# Patient Record
Sex: Female | Born: 1935 | Race: White | Hispanic: No | Smoking: Never smoker
Health system: Southern US, Community
[De-identification: ages and names within clinical notes are randomized; demographics above are authoritative.]

## PROBLEM LIST (undated history)

## (undated) DIAGNOSIS — R2681 Unsteadiness on feet: Secondary | ICD-10-CM

## (undated) DIAGNOSIS — I10 Essential (primary) hypertension: Secondary | ICD-10-CM

## (undated) DIAGNOSIS — I83813 Varicose veins of bilateral lower extremities with pain: Secondary | ICD-10-CM

## (undated) DIAGNOSIS — F419 Anxiety disorder, unspecified: Secondary | ICD-10-CM

## (undated) DIAGNOSIS — N938 Other specified abnormal uterine and vaginal bleeding: Secondary | ICD-10-CM

## (undated) DIAGNOSIS — R4020 Unspecified coma: Secondary | ICD-10-CM

## (undated) DIAGNOSIS — R002 Palpitations: Secondary | ICD-10-CM

## (undated) DIAGNOSIS — N950 Postmenopausal bleeding: Secondary | ICD-10-CM

## (undated) DIAGNOSIS — W19XXXA Unspecified fall, initial encounter: Secondary | ICD-10-CM

## (undated) DIAGNOSIS — R079 Chest pain, unspecified: Secondary | ICD-10-CM

## (undated) DIAGNOSIS — H269 Unspecified cataract: Secondary | ICD-10-CM

## (undated) DIAGNOSIS — K219 Gastro-esophageal reflux disease without esophagitis: Secondary | ICD-10-CM

## (undated) DIAGNOSIS — H9190 Unspecified hearing loss, unspecified ear: Secondary | ICD-10-CM

## (undated) DIAGNOSIS — M1612 Unilateral primary osteoarthritis, left hip: Secondary | ICD-10-CM

## (undated) DIAGNOSIS — M810 Age-related osteoporosis without current pathological fracture: Secondary | ICD-10-CM

## (undated) DIAGNOSIS — I499 Cardiac arrhythmia, unspecified: Secondary | ICD-10-CM

## (undated) DIAGNOSIS — K5790 Diverticulosis of intestine, part unspecified, without perforation or abscess without bleeding: Secondary | ICD-10-CM

## (undated) DIAGNOSIS — T4145XA Adverse effect of unspecified anesthetic, initial encounter: Secondary | ICD-10-CM

## (undated) DIAGNOSIS — R413 Other amnesia: Secondary | ICD-10-CM

## (undated) HISTORY — DX: Unilateral primary osteoarthritis, left hip: M16.12

## (undated) HISTORY — DX: Other specified abnormal uterine and vaginal bleeding: N93.8

## (undated) HISTORY — DX: Unsteadiness on feet: R26.81

## (undated) HISTORY — DX: Age-related osteoporosis without current pathological fracture: M81.0

## (undated) HISTORY — PX: FRACTURE SURGERY: SHX138

## (undated) HISTORY — DX: Unspecified coma: R40.20

## (undated) HISTORY — DX: Diverticulosis of intestine, part unspecified, without perforation or abscess without bleeding: K57.90

## (undated) HISTORY — PX: CATARACT EXTRACTION: SUR2

## (undated) HISTORY — DX: Palpitations: R00.2

## (undated) HISTORY — DX: Gastro-esophageal reflux disease without esophagitis: K21.9

## (undated) HISTORY — DX: Unspecified cataract: H26.9

## (undated) HISTORY — DX: Other amnesia: R41.3

## (undated) HISTORY — DX: Chest pain, unspecified: R07.9

## (undated) HISTORY — PX: DIAGNOSTIC LAPAROSCOPY: SUR761

## (undated) HISTORY — PX: HIP SURGERY: SHX245

## (undated) HISTORY — PX: TONSILLECTOMY: SUR1361

## (undated) HISTORY — PX: APPENDECTOMY: SHX54

## (undated) HISTORY — DX: Unspecified hearing loss, unspecified ear: H91.90

## (undated) HISTORY — DX: Anxiety disorder, unspecified: F41.9

## (undated) HISTORY — DX: Unspecified fall, initial encounter: W19.XXXA

## (undated) HISTORY — DX: Varicose veins of bilateral lower extremities with pain: I83.813

## (undated) HISTORY — PX: TONSILLECTOMY AND ADENOIDECTOMY: SUR1326

---
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
## 1964-09-14 HISTORY — PX: ECTOPIC PREGNANCY SURGERY: SHX613

## 1998-07-25 ENCOUNTER — Other Ambulatory Visit: Admission: RE | Admit: 1998-07-25 | Discharge: 1998-07-25 | Payer: Self-pay | Admitting: Internal Medicine

## 1998-08-24 ENCOUNTER — Emergency Department (HOSPITAL_COMMUNITY): Admission: EM | Admit: 1998-08-24 | Discharge: 1998-08-24 | Payer: Self-pay | Admitting: Emergency Medicine

## 1999-09-11 ENCOUNTER — Encounter: Payer: Self-pay | Admitting: Emergency Medicine

## 1999-09-11 ENCOUNTER — Inpatient Hospital Stay (HOSPITAL_COMMUNITY): Admission: EM | Admit: 1999-09-11 | Discharge: 1999-09-13 | Payer: Self-pay

## 1999-09-13 ENCOUNTER — Encounter: Payer: Self-pay | Admitting: Cardiology

## 2000-11-03 ENCOUNTER — Encounter (HOSPITAL_BASED_OUTPATIENT_CLINIC_OR_DEPARTMENT_OTHER): Payer: Self-pay | Admitting: Internal Medicine

## 2000-11-03 ENCOUNTER — Ambulatory Visit (HOSPITAL_COMMUNITY): Admission: RE | Admit: 2000-11-03 | Discharge: 2000-11-03 | Payer: Self-pay | Admitting: Internal Medicine

## 2001-01-19 ENCOUNTER — Ambulatory Visit (HOSPITAL_COMMUNITY): Admission: RE | Admit: 2001-01-19 | Discharge: 2001-01-19 | Payer: Self-pay | Admitting: Gastroenterology

## 2003-08-16 ENCOUNTER — Encounter: Admission: RE | Admit: 2003-08-16 | Discharge: 2003-08-16 | Payer: Self-pay | Admitting: Orthopedic Surgery

## 2009-03-12 ENCOUNTER — Encounter: Admission: RE | Admit: 2009-03-12 | Discharge: 2009-03-12 | Payer: Self-pay | Admitting: Orthopedic Surgery

## 2010-02-15 ENCOUNTER — Observation Stay (HOSPITAL_COMMUNITY): Admission: EM | Admit: 2010-02-15 | Discharge: 2010-02-16 | Payer: Self-pay | Admitting: Emergency Medicine

## 2010-02-16 DIAGNOSIS — R079 Chest pain, unspecified: Secondary | ICD-10-CM

## 2010-02-16 HISTORY — DX: Chest pain, unspecified: R07.9

## 2010-12-01 LAB — URINALYSIS, ROUTINE W REFLEX MICROSCOPIC
Bilirubin Urine: NEGATIVE
Glucose, UA: NEGATIVE mg/dL
Hgb urine dipstick: NEGATIVE
Ketones, ur: NEGATIVE mg/dL
Nitrite: NEGATIVE
Protein, ur: NEGATIVE mg/dL
Specific Gravity, Urine: 1.019 (ref 1.005–1.030)
Urobilinogen, UA: 0.2 mg/dL (ref 0.0–1.0)
pH: 6.5 (ref 5.0–8.0)

## 2010-12-01 LAB — LIPID PANEL
Cholesterol: 184 mg/dL (ref 0–200)
HDL: 66 mg/dL (ref >39)
LDL Cholesterol: 109 mg/dL — ABNORMAL HIGH (ref 0–99)
Total CHOL/HDL Ratio: 2.8 RATIO
Triglycerides: 45 mg/dL (ref <150)
VLDL: 9 mg/dL (ref 0–40)

## 2010-12-01 LAB — CARDIAC PANEL(CRET KIN+CKTOT+MB+TROPI)
CK, MB: 2.2 ng/mL (ref 0.3–4.0)
CK, MB: 2.3 ng/mL (ref 0.3–4.0)
Relative Index: INVALID (ref 0.0–2.5)
Relative Index: INVALID (ref 0.0–2.5)
Total CK: 75 U/L (ref 7–177)
Total CK: 79 U/L (ref 7–177)
Troponin I: 0.01 ng/mL (ref 0.00–0.06)
Troponin I: 0.01 ng/mL (ref 0.00–0.06)

## 2010-12-01 LAB — COMPREHENSIVE METABOLIC PANEL
ALT: 18 U/L (ref 0–35)
AST: 21 U/L (ref 0–37)
Albumin: 3.7 g/dL (ref 3.5–5.2)
Alkaline Phosphatase: 66 U/L (ref 39–117)
BUN: 16 mg/dL (ref 6–23)
CO2: 25 mEq/L (ref 19–32)
Calcium: 9.3 mg/dL (ref 8.4–10.5)
Chloride: 104 mEq/L (ref 96–112)
Creatinine, Ser: 0.92 mg/dL (ref 0.4–1.2)
GFR calc Af Amer: >60 mL/min (ref >60)
GFR calc non Af Amer: 59 mL/min — ABNORMAL LOW (ref >60)
Glucose, Bld: 102 mg/dL — ABNORMAL HIGH (ref 70–99)
Potassium: 4.6 mEq/L (ref 3.5–5.1)
Sodium: 135 mEq/L (ref 135–145)
Total Bilirubin: 0.5 mg/dL (ref 0.3–1.2)
Total Protein: 6.3 g/dL (ref 6.0–8.3)

## 2010-12-01 LAB — URINE MICROSCOPIC-ADD ON

## 2010-12-01 LAB — PROTIME-INR
INR: 0.95 (ref 0.00–1.49)
Prothrombin Time: 12.6 seconds (ref 11.6–15.2)

## 2010-12-01 LAB — POCT CARDIAC MARKERS
CKMB, poc: 1.4 ng/mL (ref 1.0–8.0)
Myoglobin, poc: 78.1 ng/mL (ref 12–200)
Troponin i, poc: <0.05 ng/mL (ref 0.00–0.09)

## 2010-12-01 LAB — CBC
HCT: 43.1 % (ref 36.0–46.0)
Hemoglobin: 14.6 g/dL (ref 12.0–15.0)
MCHC: 34 g/dL (ref 30.0–36.0)
MCV: 90.9 fL (ref 78.0–100.0)
Platelets: 262 10*3/uL (ref 150–400)
RBC: 4.74 MIL/uL (ref 3.87–5.11)
RDW: 14.8 % (ref 11.5–15.5)
WBC: 6.2 10*3/uL (ref 4.0–10.5)

## 2010-12-01 LAB — DIFFERENTIAL
Basophils Absolute: 0 10*3/uL (ref 0.0–0.1)
Basophils Relative: 1 % (ref 0–1)
Eosinophils Absolute: 0.4 10*3/uL (ref 0.0–0.7)
Eosinophils Relative: 6 % — ABNORMAL HIGH (ref 0–5)
Lymphocytes Relative: 31 % (ref 12–46)
Lymphs Abs: 1.9 10*3/uL (ref 0.7–4.0)
Monocytes Absolute: 0.5 10*3/uL (ref 0.1–1.0)
Monocytes Relative: 8 % (ref 3–12)
Neutro Abs: 3.4 10*3/uL (ref 1.7–7.7)
Neutrophils Relative %: 55 % (ref 43–77)

## 2010-12-01 LAB — D-DIMER, QUANTITATIVE: D-Dimer, Quant: 0.37 ug/mL-FEU (ref 0.00–0.48)

## 2010-12-01 LAB — BASIC METABOLIC PANEL
BUN: 20 mg/dL (ref 6–23)
CO2: 20 mEq/L (ref 19–32)
Calcium: 9.9 mg/dL (ref 8.4–10.5)
Chloride: 104 mEq/L (ref 96–112)
Creatinine, Ser: 0.98 mg/dL (ref 0.4–1.2)
GFR calc Af Amer: >60 mL/min (ref >60)
GFR calc non Af Amer: 55 mL/min — ABNORMAL LOW (ref >60)
Glucose, Bld: 105 mg/dL — ABNORMAL HIGH (ref 70–99)
Potassium: 3.8 mEq/L (ref 3.5–5.1)
Sodium: 133 mEq/L — ABNORMAL LOW (ref 135–145)

## 2010-12-01 LAB — HEMOCCULT GUIAC POC 1CARD (OFFICE): Fecal Occult Bld: NEGATIVE

## 2010-12-01 LAB — TSH: TSH: 3.144 u[IU]/mL (ref 0.350–4.500)

## 2010-12-01 LAB — TROPONIN I: Troponin I: 0.02 ng/mL (ref 0.00–0.06)

## 2010-12-01 LAB — CK TOTAL AND CKMB (NOT AT ARMC)
CK, MB: 2.5 ng/mL (ref 0.3–4.0)
Relative Index: INVALID (ref 0.0–2.5)
Total CK: 83 U/L (ref 7–177)

## 2010-12-01 LAB — APTT: aPTT: 27 seconds (ref 24–37)

## 2011-01-30 NOTE — Discharge Summary (Signed)
[place]. [facility]  Patient:    Melissa Valenzuela                    MRN: [id number] Adm. Date:  [id number] Disc. Date: [id number] Attending:  Fritzi Mandes CC:         Clovis Pu Patty Sermons, M.D.                           Discharge Summary  HISTORY OF PRESENT ILLNESS:  [age]-year-old white female who awoke with substernal chest pain radiating to the left arm.  This discomfort lasted about eight hours, and was not associated with shortness of breath, diaphoresis, or nausea.  She was quite anxious at the time.  She presented to the [facility] Emergency Room for evaluation.  PAST MEDICAL HISTORY: 1. Vertebral compression fracture with chronic low back pain. 2. In 1997, workup for microscopic hematuria, which was normal. 3. Diverticulosis. 4. Cataracts.  MEDICATIONS: 1. Premarin cream p.r.n. 2. Fosamax 10 mg p.o. q.d. 3. Aspirin 325 mg p.o. q.d. p.r.n. 5. Multivitamins. 6. Calcium supplements.  PAST SURGICAL HISTORY: 1. In 1988, right hip ORIF. 2. In 1960s, ectopic pregnancy leading to left salpingo-oophorectomy.  PHYSICAL EXAMINATION:  GENERAL:  Well-appearing white female, in no apparent distress.  VITAL SIGNS:  Initial blood pressure 178/113 with pulse of 72, respiratory rate 20, and 100% oxygen saturation on room air.  HEENT:  Within normal limits.  CHEST:  Clear to auscultation.  HEENT:  Regular rate and rhythm.  S1, S2, without murmur, gallop, or rub.  ABDOMEN:  Normal bowel sounds with no evidence of hepatosplenomegaly or tenderness.  EXTREMITIES:  Without cyanosis, clubbing, or edema.  NEUROLOGIC:  Nonfocal.  LABORATORY DATA:  Initial CPK level was 116 with MB 2.2, troponin I level was 0.06.  EKG showed: 1. Normal sinus rhythm. 2. Within normal limits.  HOSPITAL COURSE:  The patient was admitted for a rule out myocardial infarction  protocol.  Serial CPK and troponin I levels were within normal limits,  showing o evidence of myocardial infarction.  She had no further episodes of chest discomfort while an inpatient.  A Cardiolite exercise test was done on September 13, 1999, n which she exercised six minutes on a Bruce protocol, achieving a maximum heart ate of 150, surpassing her target heart rate.  She had no chest discomfort with exercise, or EKG changes suggestive of ischemia.  The Cardiolite images showed normal left ventricular wall motion with somewhat prominent activity in the right ventricle suggestive of right ventricular enlargement and chronic lung disease.  There was no evidence of ischemia or infarction.  The estimated left ventricular ejection fraction was 65%.  CONDITION ON DISCHARGE:  The patient felt fine, with no further episodes of chest discomfort or shortness of breath.  PROCEDURES:  Cardiolite exercise stress test.  COMPLICATIONS:  None.  DISCHARGE DIAGNOSES: 1. Chest pain with no evidence of myocardial infarction or coronary artery disease. 2. Osteoporosis. 3. Chronic low back pain. 4. History of microscopic hematuria.  DISCHARGE MEDICATIONS: 1. Atenolol 25 mg p.o. q.a.m. 2. Fosamax 10 mg p.o. q.d. 3. Enteric-coated aspirin 325 mg p.o. q.d. p.r.n. 4. Multivitamins 1 tablet p.o. q.d. 5. Calcium supplements daily. 6. Premarin cream p.r.n.  FOLLOW-UP:  The patient was advised to call Dr. Jarome Matin for a follow-up at [facility] within two weeks of discharge. DD:  10/21/99 TD:  10/21/99 Job: [id number]

## 2011-01-30 NOTE — Procedures (Signed)
[facility]  Patient:    Melissa Valenzuela, NEEDS                   MRN: [id number] Proc. Date: 01/19/01 Adm. Date:  [id number] Attending:  Orland Mustard CC:         Barry Dienes. Eloise Harman, M.D.   Procedure Report  PROCEDURE:  Colonoscopy.  SCOPE:  Olympus pediatric video colonoscope.  MEDICATIONS:  Fentanyl 125 mcg, Versed 8.5 mg IV.  INDICATIONS:  A strong family history of colon cancer.  Her mother had colon cancer, great-aunt had colon cancer and also polyps in the family.  DESCRIPTION OF PROCEDURE:  The procedure had been explained to the patient and consent obtained.  With the patient in the left lateral decubitus position, the Olympus pediatric video colonoscope was inserted and advanced under direct visualization.  The prep was excellent.  The patient had fairly extensive diverticular disease.  It took some time to pass.  After we passed, we were able to advance fairly easily to the cecum.  The ileocecal valve was identified.  The scope was withdrawn.  The cecum, ascending colon, hepatic flexure, transverse colon, splenic flexure, descending, and sigmoid colon were seen well, and no polyps were seen.  Extensive diverticula disease of the sigmoid colon.  The rectum was free of polyps.  The scope was withdrawn.  The patient tolerated the procedure well and was maintained on low-flow oxygen and pulse oximeter throughout the procedure.  ASSESSMENT: 1. No evidence of colon polyps or any other neoplasia. 2. Diverticulosis.  PLAN:  Yearly Hemoccults.  We will give a handout on diverticular disease and recommend repeating in five years due to her family history of colon cancer. DD:  01/19/01 TD:  01/19/01 Job: [id number]

## 2011-04-23 ENCOUNTER — Other Ambulatory Visit (HOSPITAL_COMMUNITY): Payer: Self-pay | Admitting: Internal Medicine

## 2011-04-23 DIAGNOSIS — R911 Solitary pulmonary nodule: Secondary | ICD-10-CM

## 2011-04-27 ENCOUNTER — Encounter (HOSPITAL_COMMUNITY): Payer: Self-pay

## 2011-04-27 ENCOUNTER — Ambulatory Visit (HOSPITAL_COMMUNITY)
Admission: RE | Admit: 2011-04-27 | Discharge: 2011-04-27 | Disposition: A | Discharge status: Home / Self Care | Payer: Medicare Other | Source: Ambulatory Visit | Attending: Internal Medicine | Admitting: Internal Medicine

## 2011-04-27 DIAGNOSIS — R911 Solitary pulmonary nodule: Secondary | ICD-10-CM

## 2011-04-27 DIAGNOSIS — Z09 Encounter for follow-up examination after completed treatment for conditions other than malignant neoplasm: Secondary | ICD-10-CM | POA: Insufficient documentation

## 2011-04-27 DIAGNOSIS — I10 Essential (primary) hypertension: Secondary | ICD-10-CM | POA: Insufficient documentation

## 2011-04-27 DIAGNOSIS — R918 Other nonspecific abnormal finding of lung field: Secondary | ICD-10-CM | POA: Insufficient documentation

## 2011-04-27 HISTORY — DX: Essential (primary) hypertension: I10

## 2011-10-20 DIAGNOSIS — I10 Essential (primary) hypertension: Secondary | ICD-10-CM | POA: Diagnosis not present

## 2011-10-20 DIAGNOSIS — E785 Hyperlipidemia, unspecified: Secondary | ICD-10-CM | POA: Diagnosis not present

## 2011-10-20 DIAGNOSIS — F411 Generalized anxiety disorder: Secondary | ICD-10-CM | POA: Diagnosis not present

## 2011-10-20 DIAGNOSIS — R002 Palpitations: Secondary | ICD-10-CM | POA: Diagnosis not present

## 2011-10-23 DIAGNOSIS — I10 Essential (primary) hypertension: Secondary | ICD-10-CM | POA: Diagnosis not present

## 2011-10-23 DIAGNOSIS — R002 Palpitations: Secondary | ICD-10-CM | POA: Diagnosis not present

## 2011-11-16 DIAGNOSIS — Z961 Presence of intraocular lens: Secondary | ICD-10-CM | POA: Diagnosis not present

## 2011-11-16 DIAGNOSIS — H353190 Nonexudative age-related macular degeneration, unspecified eye, stage unspecified: Secondary | ICD-10-CM | POA: Diagnosis not present

## 2011-12-03 DIAGNOSIS — H43819 Vitreous degeneration, unspecified eye: Secondary | ICD-10-CM | POA: Diagnosis not present

## 2011-12-03 DIAGNOSIS — H35359 Cystoid macular degeneration, unspecified eye: Secondary | ICD-10-CM | POA: Diagnosis not present

## 2011-12-03 DIAGNOSIS — H35379 Puckering of macula, unspecified eye: Secondary | ICD-10-CM | POA: Diagnosis not present

## 2011-12-03 DIAGNOSIS — H33109 Unspecified retinoschisis, unspecified eye: Secondary | ICD-10-CM | POA: Diagnosis not present

## 2011-12-17 DIAGNOSIS — H35379 Puckering of macula, unspecified eye: Secondary | ICD-10-CM | POA: Diagnosis not present

## 2011-12-17 DIAGNOSIS — H35359 Cystoid macular degeneration, unspecified eye: Secondary | ICD-10-CM | POA: Diagnosis not present

## 2012-02-18 DIAGNOSIS — Z01419 Encounter for gynecological examination (general) (routine) without abnormal findings: Secondary | ICD-10-CM | POA: Diagnosis not present

## 2012-02-18 DIAGNOSIS — Z124 Encounter for screening for malignant neoplasm of cervix: Secondary | ICD-10-CM | POA: Diagnosis not present

## 2012-02-18 DIAGNOSIS — Z0000 Encounter for general adult medical examination without abnormal findings: Secondary | ICD-10-CM | POA: Diagnosis not present

## 2012-02-19 DIAGNOSIS — Z1231 Encounter for screening mammogram for malignant neoplasm of breast: Secondary | ICD-10-CM | POA: Diagnosis not present

## 2012-02-24 DIAGNOSIS — R928 Other abnormal and inconclusive findings on diagnostic imaging of breast: Secondary | ICD-10-CM | POA: Diagnosis not present

## 2012-03-24 DIAGNOSIS — H33109 Unspecified retinoschisis, unspecified eye: Secondary | ICD-10-CM | POA: Diagnosis not present

## 2012-03-24 DIAGNOSIS — H35359 Cystoid macular degeneration, unspecified eye: Secondary | ICD-10-CM | POA: Diagnosis not present

## 2012-03-24 DIAGNOSIS — H35379 Puckering of macula, unspecified eye: Secondary | ICD-10-CM | POA: Diagnosis not present

## 2012-03-24 DIAGNOSIS — H43819 Vitreous degeneration, unspecified eye: Secondary | ICD-10-CM | POA: Diagnosis not present

## 2012-04-28 DIAGNOSIS — S0083XA Contusion of other part of head, initial encounter: Secondary | ICD-10-CM | POA: Diagnosis not present

## 2012-04-28 DIAGNOSIS — IMO0002 Reserved for concepts with insufficient information to code with codable children: Secondary | ICD-10-CM | POA: Diagnosis not present

## 2012-04-28 DIAGNOSIS — I10 Essential (primary) hypertension: Secondary | ICD-10-CM | POA: Diagnosis not present

## 2012-04-28 DIAGNOSIS — S0990XA Unspecified injury of head, initial encounter: Secondary | ICD-10-CM | POA: Diagnosis not present

## 2012-04-28 DIAGNOSIS — R42 Dizziness and giddiness: Secondary | ICD-10-CM | POA: Diagnosis not present

## 2012-04-28 DIAGNOSIS — S0003XA Contusion of scalp, initial encounter: Secondary | ICD-10-CM | POA: Diagnosis not present

## 2012-04-28 DIAGNOSIS — Z043 Encounter for examination and observation following other accident: Secondary | ICD-10-CM | POA: Diagnosis not present

## 2012-05-03 DIAGNOSIS — E785 Hyperlipidemia, unspecified: Secondary | ICD-10-CM | POA: Diagnosis not present

## 2012-05-03 DIAGNOSIS — R82998 Other abnormal findings in urine: Secondary | ICD-10-CM | POA: Diagnosis not present

## 2012-05-03 DIAGNOSIS — I10 Essential (primary) hypertension: Secondary | ICD-10-CM | POA: Diagnosis not present

## 2012-05-03 DIAGNOSIS — M810 Age-related osteoporosis without current pathological fracture: Secondary | ICD-10-CM | POA: Diagnosis not present

## 2012-05-06 DIAGNOSIS — E785 Hyperlipidemia, unspecified: Secondary | ICD-10-CM | POA: Diagnosis not present

## 2012-05-06 DIAGNOSIS — I10 Essential (primary) hypertension: Secondary | ICD-10-CM | POA: Diagnosis not present

## 2012-05-06 DIAGNOSIS — Z23 Encounter for immunization: Secondary | ICD-10-CM | POA: Diagnosis not present

## 2012-05-06 DIAGNOSIS — Z0000 Encounter for general adult medical examination without abnormal findings: Secondary | ICD-10-CM | POA: Diagnosis not present

## 2012-05-10 DIAGNOSIS — Z1212 Encounter for screening for malignant neoplasm of rectum: Secondary | ICD-10-CM | POA: Diagnosis not present

## 2012-06-08 DIAGNOSIS — Z23 Encounter for immunization: Secondary | ICD-10-CM | POA: Diagnosis not present

## 2012-08-25 DIAGNOSIS — Z09 Encounter for follow-up examination after completed treatment for conditions other than malignant neoplasm: Secondary | ICD-10-CM | POA: Diagnosis not present

## 2012-08-25 DIAGNOSIS — R928 Other abnormal and inconclusive findings on diagnostic imaging of breast: Secondary | ICD-10-CM | POA: Diagnosis not present

## 2012-09-28 DIAGNOSIS — I10 Essential (primary) hypertension: Secondary | ICD-10-CM | POA: Diagnosis not present

## 2012-09-28 DIAGNOSIS — R002 Palpitations: Secondary | ICD-10-CM | POA: Diagnosis not present

## 2012-10-06 DIAGNOSIS — H35379 Puckering of macula, unspecified eye: Secondary | ICD-10-CM | POA: Diagnosis not present

## 2012-10-06 DIAGNOSIS — H35359 Cystoid macular degeneration, unspecified eye: Secondary | ICD-10-CM | POA: Diagnosis not present

## 2012-10-06 DIAGNOSIS — H33109 Unspecified retinoschisis, unspecified eye: Secondary | ICD-10-CM | POA: Diagnosis not present

## 2012-10-13 DIAGNOSIS — H16209 Unspecified keratoconjunctivitis, unspecified eye: Secondary | ICD-10-CM | POA: Diagnosis not present

## 2012-12-21 DIAGNOSIS — H33109 Unspecified retinoschisis, unspecified eye: Secondary | ICD-10-CM | POA: Diagnosis not present

## 2012-12-21 DIAGNOSIS — H43819 Vitreous degeneration, unspecified eye: Secondary | ICD-10-CM | POA: Diagnosis not present

## 2013-01-02 DIAGNOSIS — Z961 Presence of intraocular lens: Secondary | ICD-10-CM | POA: Diagnosis not present

## 2013-01-02 DIAGNOSIS — H353190 Nonexudative age-related macular degeneration, unspecified eye, stage unspecified: Secondary | ICD-10-CM | POA: Diagnosis not present

## 2013-01-06 DIAGNOSIS — F411 Generalized anxiety disorder: Secondary | ICD-10-CM | POA: Diagnosis not present

## 2013-01-06 DIAGNOSIS — E785 Hyperlipidemia, unspecified: Secondary | ICD-10-CM | POA: Diagnosis not present

## 2013-01-06 DIAGNOSIS — R002 Palpitations: Secondary | ICD-10-CM | POA: Diagnosis not present

## 2013-01-06 DIAGNOSIS — I10 Essential (primary) hypertension: Secondary | ICD-10-CM | POA: Diagnosis not present

## 2013-01-06 DIAGNOSIS — K219 Gastro-esophageal reflux disease without esophagitis: Secondary | ICD-10-CM | POA: Diagnosis not present

## 2013-01-12 HISTORY — PX: OTHER SURGICAL HISTORY: SHX169

## 2013-01-12 HISTORY — PX: TRANSTHORACIC ECHOCARDIOGRAM: SHX275

## 2013-01-23 ENCOUNTER — Encounter: Payer: Self-pay | Admitting: Cardiology

## 2013-01-24 ENCOUNTER — Ambulatory Visit (INDEPENDENT_AMBULATORY_CARE_PROVIDER_SITE_OTHER): Payer: Medicare Other | Admitting: Cardiology

## 2013-01-24 VITALS — BP 140/80 | HR 69 | Ht 65.5 in | Wt 180.5 lb

## 2013-01-24 DIAGNOSIS — R0602 Shortness of breath: Secondary | ICD-10-CM

## 2013-01-24 DIAGNOSIS — I10 Essential (primary) hypertension: Secondary | ICD-10-CM | POA: Insufficient documentation

## 2013-01-24 DIAGNOSIS — I872 Venous insufficiency (chronic) (peripheral): Secondary | ICD-10-CM

## 2013-01-24 DIAGNOSIS — R002 Palpitations: Secondary | ICD-10-CM | POA: Insufficient documentation

## 2013-01-24 NOTE — Progress Notes (Signed)
Patient ID: Melissa Valenzuela, female   DOB: 21-Aug-1931, 79 y.o.   MRN: [id number] [facility]   Clinic Note: HPI: Melissa Valenzuela is a 79 y.o. female with a PMH below who presents today for re-evaluation for recurrent palpitations, SOB & labile BP.  Was seen over 08/2010 by me & previously by Dr. Karsten Fells for palpitations and atypical chest discomfort. Has CTA Chest & Myoview with no evidence of ischemia.  Had been stable since that time until the last few weeks when she has noted several episodes of "feeling her heart thumping very hard" in her chest.  It feels as though it was going fast.  This makes her chest uncomfortable & a bit short of breath.  Often happens at night.  Feels as though her heart is "hanging loosely" in her chest. She checked her BP & HR during the most recent episode -- 180/85 mmhg, HR 83 --> 5 min later 175/85 mmHg 87 bpm.  Otherwise AM BPs are as low as 110s/60s.   Daytime checks range in the 110-140s/70-80s mmHg.    She denies and significant stressful situations, but her husband does note that she get anxious easily.  Cardiovascular ROS: positive for - irregular heartbeat, palpitations, rapid heart rate and with mild chest discomfort and dyspnea during "spells" negative for - chest pain, edema, loss of consciousness, murmur, orthopnea or paroxysmal nocturnal dyspnea  No TIA, Amaurosis fugax symptoms; mildly lightheaded with episodes, but no syncope or near syncope.   No claudication.  She also notes tender bilateral leg varicose veins that get swollen & painful.  She also endorses restless leg type symptoms & PM dull aching in her legs.  No significant color change in legs & no significant edema.  No Known Allergies  Current Outpatient Prescriptions  Medication Sig Dispense Refill  . aspirin 81 MG tablet Take 81 mg by mouth daily.      . calcium carbonate 200 MG capsule Take 1 capsule by mouth daily.      . Calcium Citrate (CITRACAL  PO) Take by mouth as needed.      . Multiple Vitamins-Calcium (DAILY COMBO MULTIVITS/CALCIUM) TABS Take 1 tablet by mouth daily.      Marland Kitchen lisinopril (PRINIVIL,ZESTRIL) 2.5 MG tablet Take 2.5 mg by mouth daily.       No current facility-administered medications for this visit.    Past Medical History  Diagnosis Date  . Hypertension   . Chest pain 02/16/2010    Myoview, negative for pharmacologic-stress induced ischemia, EF 74  . Palpitation 02/16/2010    Myoview, Negative for pharmacologic-stress induced ischemia, EF 74    No past surgical history on file.  History   Social History  . Marital Status: Single    Spouse Name: N/A    Number of Children: N/A  . Years of Education: N/A   Occupational History  . Not on file.   Social History Main Topics  . Smoking status: Never Smoker   . Smokeless tobacco: Not on file  . Alcohol Use: 3.5 oz/week    7 drink(s) per week  . Drug Use: No  . Sexually Active: Not on file   Other Topics Concern  . Not on file   Social History Narrative   She and her husband are relatively active, but have not been going to [employer] as much as they used to.  They have been traveling -- just returned from [place] where they did quite a bit of walking.  Review of Systems  Constitutional: Positive for malaise/fatigue.  HENT: Negative.   Eyes: Negative.   Respiratory: Negative.   Cardiovascular: Positive for palpitations. Negative for orthopnea, claudication, leg swelling and PND.  Gastrointestinal: Negative for abdominal pain, diarrhea, constipation, blood in stool and melena.  Genitourinary: Negative for frequency and hematuria.  Musculoskeletal: Negative.   Skin: Negative.   Neurological: Negative.   Psychiatric/Behavioral: The patient is nervous/anxious.    PHYSICAL EXAM BP 140/80  Pulse 69  Ht 5' 5.5" (1.664 m)  Wt 81.874 kg (180 lb 8 oz)  BMI 29.57 kg/m2 General appearance: alert, cooperative, appears stated age, no distress  and anxious, but pleasan tmood & affect Neck: no adenopathy, no carotid bruit, no JVD, supple, symmetrical, trachea midline and thyroid not enlarged, symmetric, no tenderness/mass/nodules Lungs: clear to auscultation bilaterally, normal percussion bilaterally and non-labored Heart: regular rate and rhythm, S1, S2 normal, no murmur, click, rub or gallop and occasional ectopy - distant heart sounds Abdomen: soft, non-tender; bowel sounds normal; no masses,  no organomegaly Extremities: edema trace, varicose veins noted and tender varicosities & spider veins; swollen & hard - no venous stasis skin changes. Pulses: 2+ and symmetric Neurologic: Grossly normal  NWG:[id number] today: Yes Rate:69 , Rhythm: sinus; normal axis;  Conduction Delay n/a; AV Block: n/a; ST-T abnormalities: Non-specific; PVC vs. Aberrant PAC.  ASSESSMENT: Returns for evaluation of Palpitations and labile BPs.  Associated with mild SOB & chest discomfort.   Has had NST & CTA to assess similar symptoms in the past.  PLAN: Monitor; Echo; VNUS Insufficiency Dopplers Orders Placed This Encounter  Procedures  . EKG 12-Lead  . Cardiac event monitor  . 2D Echocardiogram without contrast    Order Specific Question:  Type of Echo    Answer:  Complete    Order Specific Question:  Reason for Exam    Answer:  palp    Order Specific Question:  Where should this test be performed    Answer:  [facility]  . Lower Extremity Venous Duplex Bilateral    Order Specific Question:  Laterality    Answer:  Bilateral    Order Specific Question:  Where should this test be performed:    Answer:  [facility] Marissa Calamity, M.D., M.S. [facility] [address] [place], [place]  [address]  [phone] Pager # [phone] 01/24/2013 4:26 PM

## 2013-01-24 NOTE — Patient Instructions (Addendum)
Reason evaluate your rapid heart  /palpitations with an echocardiogram and a heart monitor. Will use these to determine future treatments or future studies that need to be performed.  Your physician has requested that you have an echocardiogram. Echocardiography is a painless test that uses sound waves to create images of your heart. It provides your doctor with information about the size and shape of your heart and how well your heart's chambers and valves are working. This procedure takes approximately one hour. There are no restrictions for this procedure.  Your heart monitor will be placed before leaving clinic today.   You'll followup with your doctor in 5-6 weeks after wearing the monitor for one month

## 2013-01-25 ENCOUNTER — Telehealth (HOSPITAL_COMMUNITY): Payer: Self-pay | Admitting: Cardiology

## 2013-01-25 ENCOUNTER — Encounter: Payer: Self-pay | Admitting: Cardiology

## 2013-01-25 DIAGNOSIS — I872 Venous insufficiency (chronic) (peripheral): Secondary | ICD-10-CM | POA: Insufficient documentation

## 2013-01-25 DIAGNOSIS — R002 Palpitations: Secondary | ICD-10-CM

## 2013-01-25 NOTE — Assessment & Plan Note (Addendum)
Most likely PACs or PVCs, since her HR was in ~80s during one episode, but would like to confirm the diagnosis and ensure the absence of a true arrhythmia. Check 2D Echocardiogram to evaluate for valvular or other structural abnormalities. Doubt that these symptoms are ischemia related -- has had normal stress test in the past for similar symptoms. Consider Anxiety or Panic Attacks as a potential etiology -- BB would potentially help.

## 2013-01-25 NOTE — Telephone Encounter (Signed)
Called to schedule test and pt had a question about her monitor. She stated that she wants to add in palpitations to her report but there is no spot to add it. Would like for Melissa Valenzuela to call her back.

## 2013-01-25 NOTE — Assessment & Plan Note (Signed)
Check Venous Insufficiency (VNUS) Dopplers for valvular reflux. Compression stockings recommended.

## 2013-01-25 NOTE — Telephone Encounter (Signed)
Spoke with patient. Instruction given to pick closest symptoms to palp. (skipped beats or heart racing)  Pt verbalized understanding.

## 2013-01-25 NOTE — Assessment & Plan Note (Signed)
Her recorded BPs at home are all well controlled.  However, she also notes having BP spikes during these episodes. No longer taking ACE-I, consider either standing or PRN Beta Blocker.

## 2013-01-30 ENCOUNTER — Telehealth: Payer: Self-pay | Admitting: *Deleted

## 2013-01-31 NOTE — Telephone Encounter (Signed)
Patient returned my call. Spoke to patient regarding the prior issue she had been having with not being able to record symptoms. Patient stated that she was now able to record symptoms, but wanted to know when she would be through with the monitor due to some dermal issues she had been experiencing as a result of the electrodes. I informed pt that her last day for the monitor was on the 12 of June. She voiced understanding and stated that her nurse, Antionette Fairy., told her that she could D/C wearing the monitor during the duration of a trip that she had coming up, and wanted to know if she needed to call [employer] to let them know of her plans. I advised her that she should. Her appointments dates and times were reiterated to her.

## 2013-02-02 DIAGNOSIS — R002 Palpitations: Secondary | ICD-10-CM

## 2013-02-02 HISTORY — DX: Palpitations: R00.2

## 2013-02-08 ENCOUNTER — Other Ambulatory Visit (HOSPITAL_COMMUNITY): Payer: Self-pay | Admitting: Cardiology

## 2013-02-08 ENCOUNTER — Ambulatory Visit (HOSPITAL_BASED_OUTPATIENT_CLINIC_OR_DEPARTMENT_OTHER)
Admission: RE | Admit: 2013-02-08 | Discharge: 2013-02-08 | Disposition: A | Discharge status: Home / Self Care | Payer: Medicare Other | Source: Ambulatory Visit | Attending: Cardiology | Admitting: Cardiology

## 2013-02-08 ENCOUNTER — Ambulatory Visit (HOSPITAL_COMMUNITY)
Admission: RE | Admit: 2013-02-08 | Discharge: 2013-02-08 | Disposition: A | Discharge status: Home / Self Care | Payer: Medicare Other | Source: Ambulatory Visit | Attending: Cardiovascular Disease | Admitting: Cardiovascular Disease

## 2013-02-08 DIAGNOSIS — I079 Rheumatic tricuspid valve disease, unspecified: Secondary | ICD-10-CM | POA: Diagnosis not present

## 2013-02-08 DIAGNOSIS — M7989 Other specified soft tissue disorders: Secondary | ICD-10-CM | POA: Diagnosis not present

## 2013-02-08 DIAGNOSIS — R002 Palpitations: Secondary | ICD-10-CM

## 2013-02-08 DIAGNOSIS — I059 Rheumatic mitral valve disease, unspecified: Secondary | ICD-10-CM | POA: Diagnosis not present

## 2013-02-08 DIAGNOSIS — I379 Nonrheumatic pulmonary valve disorder, unspecified: Secondary | ICD-10-CM | POA: Insufficient documentation

## 2013-02-08 DIAGNOSIS — R0989 Other specified symptoms and signs involving the circulatory and respiratory systems: Secondary | ICD-10-CM | POA: Insufficient documentation

## 2013-02-08 DIAGNOSIS — R0609 Other forms of dyspnea: Secondary | ICD-10-CM | POA: Diagnosis not present

## 2013-02-08 DIAGNOSIS — I872 Venous insufficiency (chronic) (peripheral): Secondary | ICD-10-CM

## 2013-02-08 DIAGNOSIS — M79609 Pain in unspecified limb: Secondary | ICD-10-CM | POA: Diagnosis not present

## 2013-02-08 NOTE — Progress Notes (Signed)
2D Echo Performed 02/08/2013    Majesty Stehlin, RCS  

## 2013-02-09 NOTE — Progress Notes (Signed)
Venous Lower Ext. Completed.

## 2013-02-16 ENCOUNTER — Ambulatory Visit (INDEPENDENT_AMBULATORY_CARE_PROVIDER_SITE_OTHER): Payer: Medicare Other | Admitting: Internal Medicine

## 2013-02-16 ENCOUNTER — Encounter: Payer: Self-pay | Admitting: Internal Medicine

## 2013-02-16 VITALS — BP 140/78 | HR 68 | Ht 65.5 in | Wt 180.0 lb

## 2013-02-16 DIAGNOSIS — I872 Venous insufficiency (chronic) (peripheral): Secondary | ICD-10-CM | POA: Diagnosis not present

## 2013-02-16 DIAGNOSIS — I83893 Varicose veins of bilateral lower extremities with other complications: Secondary | ICD-10-CM

## 2013-02-16 NOTE — Progress Notes (Signed)
OFFICE NOTE  Chief Complaint:  Varicose veins  Primary Care Physician: Garlan Fillers, MD  HPI:  Melissa Valenzuela is a pleasant 79 year old female previously seen by Dr. Herbie Baltimore and 2011 history of chest pain. She underwent a lax skin Monocryl perfusion scan which was negative for ischemia. Previously she was followed by Dr. Charline Bills. shore and in her office. She has a history of right greater than left varicose veins which sometimes give her symptoms but are generally not bothersome. Recently she was sent for a venous Doppler study, which demonstrated a significant varicose right greater saphenous vein. This vessel measured up to 6 mm with continuous reflux from the mid calf to the saphenofemoral junction. There is no significant reflux noted in the left greater saphenous vein or the bilateral short saphenous veins. No DVT was noted either.  She is seen today for evaluation of her symptoms.  Of note, her grandmother did have varicose veins. She has a history of prior pregnancies. Other risk factors for venous reflux.  PMHx:  Past Medical History  Diagnosis Date  . Hypertension   . Chest pain 02/16/2010    Myoview, negative for pharmacologic-stress induced ischemia, EF 74  . Palpitation 02/16/2010    Myoview, Negative for pharmacologic-stress induced ischemia, EF 74    History reviewed. No pertinent past surgical history.  FAMHx:  Family History  Problem Relation Age of Onset  . Stroke Mother 81  . Hypertension Mother   . Colon cancer Mother 25  . Hypertension Father   . Stroke Father     2  . Hypertension Son 57    Started on BP medicine age [age]    SOCHx:   reports that she has never smoked. She does not have any smokeless tobacco history on file. She reports that she drinks about 3.5 ounces of alcohol per week. She reports that she does not use illicit drugs.  ALLERGIES:  No Known Allergies  ROS: A comprehensive review of systems was negative except for: Cardiovascular:  positive for lower extremity edema  HOME MEDS: Current Outpatient Prescriptions  Medication Sig Dispense Refill  . aspirin 81 MG tablet Take 81 mg by mouth daily.      . calcium carbonate 200 MG capsule Take 1 capsule by mouth daily.      . Calcium Citrate (CITRACAL PO) Take by mouth as needed.      . Multiple Vitamins-Calcium (DAILY COMBO MULTIVITS/CALCIUM) TABS Take 1 tablet by mouth daily.       No current facility-administered medications for this visit.    LABS/IMAGING: No results found for this or any previous visit (from the past 48 hour(s)). No results found.  VITALS: BP 140/78  Pulse 68  Ht 5' 5.5" (1.664 m)  Wt 180 lb (81.647 kg)  BMI 29.49 kg/m2  EXAM: Focused physical exam of the right lower extremity demonstrates reticular veins, no obvious varicose veins, and mild hemosiderin deposition over the right ankle. There is a small amount of edema at the ankle as well.  EKG: deferred  ASSESSMENT: 1. 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   [name] has significant venous reflux with a dilated vessel and continuous flow in the right greater saphenous vein. Although despite this finding, she has intermittent edema, rare restlessness and or in frequent discomfort. We discussed ways to manage this including a compression stocking, which she was amenable for today. I also discussed venous ablation, however she did not seem very interested in the  procedure. For now I think that she could continue with a compression stocking, and if this benefits her, she may wish to reconsider venous ablation. I think she is a good candidate he would derive benefit, despite the fact that her symptoms are infrequent.  Chrystie Nose, MD, [facility] Attending Cardiologist [facility]  HILTY,Kenneth C 02/16/2013, 6:37 PM

## 2013-02-16 NOTE — Patient Instructions (Addendum)
Fit right leg compression stocking 20-30 mmHG  R ankle 9.5 inch R calf 15 inch #3 stocking  Follow-up as needed

## 2013-02-22 ENCOUNTER — Telehealth: Payer: Self-pay | Admitting: *Deleted

## 2013-02-22 NOTE — Telephone Encounter (Signed)
Spoke with patient . Result given.  Will let schedulers know to schedule appointment to follow up

## 2013-02-22 NOTE — Telephone Encounter (Signed)
Message copied by Tobin Chad on Wed Feb 22, 2013  9:55 AM ------      Message from: Herbie Baltimore, DAVID      Created: Wed Feb 08, 2013  2:59 PM       essentially normal echocardiogram            HHe ARDING,DAVID W, MD       ------

## 2013-02-23 DIAGNOSIS — H33109 Unspecified retinoschisis, unspecified eye: Secondary | ICD-10-CM | POA: Diagnosis not present

## 2013-02-23 DIAGNOSIS — H43819 Vitreous degeneration, unspecified eye: Secondary | ICD-10-CM | POA: Diagnosis not present

## 2013-02-23 DIAGNOSIS — H35359 Cystoid macular degeneration, unspecified eye: Secondary | ICD-10-CM | POA: Diagnosis not present

## 2013-02-23 DIAGNOSIS — H35379 Puckering of macula, unspecified eye: Secondary | ICD-10-CM | POA: Diagnosis not present

## 2013-03-07 ENCOUNTER — Ambulatory Visit: Payer: Medicare Other | Admitting: Cardiology

## 2013-03-14 ENCOUNTER — Encounter: Payer: Self-pay | Admitting: Cardiology

## 2013-03-14 ENCOUNTER — Ambulatory Visit (INDEPENDENT_AMBULATORY_CARE_PROVIDER_SITE_OTHER): Payer: Medicare Other | Admitting: Cardiology

## 2013-03-14 VITALS — BP 140/80 | HR 68 | Ht 66.0 in | Wt 179.2 lb

## 2013-03-14 DIAGNOSIS — I872 Venous insufficiency (chronic) (peripheral): Secondary | ICD-10-CM

## 2013-03-14 DIAGNOSIS — I10 Essential (primary) hypertension: Secondary | ICD-10-CM

## 2013-03-14 DIAGNOSIS — R002 Palpitations: Secondary | ICD-10-CM

## 2013-03-14 NOTE — Patient Instructions (Addendum)
Your Echocardiogram was pretty normal. On your monitor,you did have quite a bit of "premature beats" & one episode where several of these beats came in a row.  These are mostly benign symptoms.  I think we are ok to follow up in 6 months with no additional studies.  Just let us know about your thoughts on Dr. Blanchie Dessert procedure.  I am not going to change any medications.  Marykay Lex, MD

## 2013-03-14 NOTE — Progress Notes (Signed)
Patient ID: Melissa Valenzuela, female   DOB: 06-01-1931, 79 y.o.   MRN: [id number]  Clinic Note: HPI: Melissa Valenzuela is a 79 y.o. female with a PMH below who presents today as a followup visit for re-evaluation for recurrent palpitations, SOB & labile BP. I saw her on May 13th for palpitations.as part of evaluation she has no history Dopplers performed for which she saw Dr. Rennis Golden, and they determined that she is better served with wearing compression stockings at this time. He deferred venous ablation unless her symptoms were to worsen.  I initially saw her in 08/2010 after taking over for Dr. Karsten Fells and was initially counseled to to evaluate palpitations and atypical chest discomfort. -- a CTA Chest was relatively normal along with a Myoview stress test with no evidence of ischemia.    When I saw her last, she noted several episodes of "feeling her heart thumping very hard" in her chest.  It feels as though it was going fast.  This makes her chest uncomfortable & a bit short of breath.  Often happens at night.  Feels as though her heart is "hanging loosely" in her chest. She checked her BP & HR during the most recent episode -- 180/85 mmhg, HR 83 --> 5 min later 175/85 mmHg 87 bpm.  Otherwise AM BPs are as low as 110s/60s.   Daytime checks range in the 110-140s/70-80s mmHg.    We evaluated with an echocardiogram back in May and she is more monitor for a month. She now returns for the results of the studies.   2-D Echo: The cavity size was normal. Wall thickness was normal. Systolic function was normal. Estimated EF 55%- 60%. Wall motion was normal, with no regional wall motion abnormalities. Grade 1 Diastolic Dysfunction with elevated LV filling pressure.  Card[facility] MCOT ( Mobile Cardiac Outpatient Telemetry):  normal sinus rhythm with PVCs, sinus tachycardia with ventricular trigeminy. Brief atrial run of 5-7 beats. Sinus tachycardia, ventricular trigeminy short run of 89 beats of  Accelerated Idioventricular Rhythm.  Interval History: She continues to note having these palpitations. Space Melissa Valenzuela feels a pounding sensation. At one time she actually remembered waking up at night noting a significantly abnormal dream. She thinks that maybe she was having these palpitations as a result of the dream orthopedist because palpitations. She said that just recently she went-will walk all over and fill up and down hills and stairs and be quite fine without chest pain or shortness of breath. She's had less of the palpitations recently however. And really only noted when she was having stressful situations. She says when she has palpitations: "my chest just feels hollow:. She denies any lightheadedness or dizziness associated with it.  No TIA or amaurosis fugaxsymptoms.she still has a little dizzy and winded during her spells of palpitations. No symptoms and less frequent.  She denies and significant stressful situations, but her husband does note that she get anxious easily.  She also noted tender bilateral leg varicose veins that get swollen & painful, but did not mention that today. I think she was reluctant to undergo any interventional procedures, and therefore did not make much of this symptom.  She also endorses restless leg type symptoms & PM dull aching in her legs.  No significant color change in legs & no significant edema.  No Known Allergies  Current Outpatient Prescriptions  Medication Sig Dispense Refill  . aspirin 81 MG tablet Take 81 mg by mouth daily.      . Calcium  Citrate (CITRACAL PO) Take by mouth as needed.      . Multiple Vitamins-Calcium (DAILY COMBO MULTIVITS/CALCIUM) TABS Take 1 tablet by mouth daily.       No current facility-administered medications for this visit.    Past Medical History  Diagnosis Date  . Hypertension   . Chest pain 02/16/2010    Myoview, negative for pharmacologic-stress induced ischemia, EF 74  . Palpitation 02/02/13    No past  surgical history on file.  History   Social History  . Marital Status: Single    Spouse Name: N/A    Number of Children: N/A  . Years of Education: N/A   Occupational History  . Not on file.   Social History Main Topics  . Smoking status: Never Smoker   . Smokeless tobacco: Never Used  . Alcohol Use: 3.5 oz/week    7 drink(s) per week     Comment: occasionally  . Drug Use: No  . Sexually Active: Not on file   Other Topics Concern  . Not on file   Social History Narrative   She and her husband are relatively active, but have not been going to [employer] as much as they used to.  They have been traveling -- just returned from [place] where they did quite a bit of walking.     Review of Systems  Constitutional: Positive for malaise/fatigue.  HENT: Negative.   Eyes: Negative.   Respiratory: Negative.   Cardiovascular: Positive for palpitations. Negative for orthopnea, claudication, leg swelling and PND.  Gastrointestinal: Negative.  Negative for blood in stool and melena.  Genitourinary: Negative for frequency and hematuria.  Musculoskeletal: Negative.   Skin: Negative.   Neurological: Negative.        No TIA or amaurosis fugax symptoms.  Psychiatric/Behavioral: The patient is nervous/anxious.   All other systems reviewed and are negative.   PHYSICAL EXAM BP 140/80  Pulse 68  Ht 5\' 6"  (1.676 m)  Wt 179 lb 3.2 oz (81.285 kg)  BMI 28.94 kg/m2 General appearance: alert, cooperative, appears stated age, no distress and anxious, but pleasan tmood & affect Neck: no adenopathy, no carotid bruit, no JVD, supple, symmetrical, trachea midline and thyroid not enlarged, symmetric, no tenderness/mass/nodules Lungs: CT AB, normal percussion bilaterally and non-labored Heart: RRR, S1, S2 normal, no M./R./G., and occasional ectopy - distant heart sounds Abdomen: soft, NT/ND./NA BS; no masses,  no organomegaly Extremities: trace edema, tender varicosities & spider veins;  swollen & hard - no venous stasis skin changes. Pulses: 2+ and symmetric Neurologic: Grossly normal  HYQ:MVHQIONGE today: no  ASSESSMENT/PLAN:  Returns for evaluation of Palpitations and labile BPs.  Associated with mild SOB & chest discomfort.   Has had NST & CTA to assess similar symptoms in the past.  At this time she had a relatively normal echocardiogram, with a monitor revealing frequent PVCs, occasionally in bigeminy and trigeminy as well. One brief run of AI VR. Relatively benign overall.  Palpitations As anticipated, she has frequent PVCs. This could explain the "hollow feeling in her chest as she knows the pauses between PVCs. There relatively benign. Since she had someLaura rates, reluctant to place her on a beta blocker. At that we've the choice if she was overly concerned about the symptoms in the future. I don't feel overly inclined to proceed with an ischemic evaluation, as she had a relatively normal evaluation in the past for similar symptoms.  Venous insufficiency - painful varicose veins, restless legs, heaviness If her  varicosities become more painful or discomfort in, would then be referred back to Dr. Rennis Golden for consideration of venous ablation. Otherwise we'll continue to use compression stockings for recommendations.  Essential hypertension - labile Her blood pressures relatively stable today. I think if it were to get will hire. I would consider using a beta blocker. In the past however she's had relatively low readings. I think lifestyle modification with continued exercise should be beneficial.   Followup:  6 months   HARDING,DAVID W, M.D., M.S. [facility] [address] [place], [place]  [address]  [phone] Pager # [phone] 01/24/2013 4:26 PM

## 2013-03-26 ENCOUNTER — Encounter: Payer: Self-pay | Admitting: Cardiology

## 2013-03-26 NOTE — Assessment & Plan Note (Addendum)
As anticipated, she has frequent PVCs. This could explain the "hollow feeling in her chest as she knows the pauses between PVCs. There relatively benign. Since she had someLaura rates, reluctant to place her on a beta blocker. At that we've the choice if she was overly concerned about the symptoms in the future. I don't feel overly inclined to proceed with an ischemic evaluation, as she had a relatively normal evaluation in the past for similar symptoms.

## 2013-03-26 NOTE — Assessment & Plan Note (Signed)
If her varicosities become more painful or discomfort in, would then be referred back to Dr. Rennis Golden for consideration of venous ablation. Otherwise we'll continue to use compression stockings for recommendations.

## 2013-03-26 NOTE — Assessment & Plan Note (Signed)
Her blood pressures relatively stable today. I think if it were to get will hire. I would consider using a beta blocker. In the past however she's had relatively low readings. I think lifestyle modification with continued exercise should be beneficial.

## 2013-05-11 DIAGNOSIS — E785 Hyperlipidemia, unspecified: Secondary | ICD-10-CM | POA: Diagnosis not present

## 2013-05-11 DIAGNOSIS — M810 Age-related osteoporosis without current pathological fracture: Secondary | ICD-10-CM | POA: Diagnosis not present

## 2013-05-11 DIAGNOSIS — I10 Essential (primary) hypertension: Secondary | ICD-10-CM | POA: Diagnosis not present

## 2013-05-11 DIAGNOSIS — R82998 Other abnormal findings in urine: Secondary | ICD-10-CM | POA: Diagnosis not present

## 2013-05-18 DIAGNOSIS — K219 Gastro-esophageal reflux disease without esophagitis: Secondary | ICD-10-CM | POA: Diagnosis not present

## 2013-05-18 DIAGNOSIS — Z0000 Encounter for general adult medical examination without abnormal findings: Secondary | ICD-10-CM | POA: Diagnosis not present

## 2013-05-18 DIAGNOSIS — Z1331 Encounter for screening for depression: Secondary | ICD-10-CM | POA: Diagnosis not present

## 2013-05-18 DIAGNOSIS — M810 Age-related osteoporosis without current pathological fracture: Secondary | ICD-10-CM | POA: Diagnosis not present

## 2013-05-18 DIAGNOSIS — I10 Essential (primary) hypertension: Secondary | ICD-10-CM | POA: Diagnosis not present

## 2013-05-18 DIAGNOSIS — Z6830 Body mass index (BMI) 30.0-30.9, adult: Secondary | ICD-10-CM | POA: Diagnosis not present

## 2013-05-18 DIAGNOSIS — Z23 Encounter for immunization: Secondary | ICD-10-CM | POA: Diagnosis not present

## 2013-05-23 DIAGNOSIS — Z1212 Encounter for screening for malignant neoplasm of rectum: Secondary | ICD-10-CM | POA: Diagnosis not present

## 2013-08-15 ENCOUNTER — Ambulatory Visit (INDEPENDENT_AMBULATORY_CARE_PROVIDER_SITE_OTHER): Payer: Medicare Other | Admitting: *Deleted

## 2013-08-15 DIAGNOSIS — R609 Edema, unspecified: Secondary | ICD-10-CM

## 2013-08-17 DIAGNOSIS — H35359 Cystoid macular degeneration, unspecified eye: Secondary | ICD-10-CM | POA: Diagnosis not present

## 2013-08-17 DIAGNOSIS — H35379 Puckering of macula, unspecified eye: Secondary | ICD-10-CM | POA: Diagnosis not present

## 2013-08-30 ENCOUNTER — Ambulatory Visit (INDEPENDENT_AMBULATORY_CARE_PROVIDER_SITE_OTHER): Payer: Medicare Other | Admitting: Cardiology

## 2013-08-30 ENCOUNTER — Encounter: Payer: Self-pay | Admitting: Cardiology

## 2013-08-30 VITALS — BP 122/60 | HR 74 | Ht 66.0 in | Wt 183.1 lb

## 2013-08-30 DIAGNOSIS — R002 Palpitations: Secondary | ICD-10-CM | POA: Diagnosis not present

## 2013-08-30 DIAGNOSIS — I872 Venous insufficiency (chronic) (peripheral): Secondary | ICD-10-CM | POA: Diagnosis not present

## 2013-08-30 DIAGNOSIS — I10 Essential (primary) hypertension: Secondary | ICD-10-CM

## 2013-08-30 NOTE — Progress Notes (Signed)
PCP: Garlan Fillers, MD  Clinic Note: Chief Complaint  Patient presents with  . 6 month ROV    occ DOE; bilat LE edema (varicose veins)   HPI: Melissa Valenzuela is a 79 y.o. female with a Cardiovascular Problem List below who presents today for five-month followup of palpitations and varicose veins with venous insufficiency. She did have lower extremity venous Dopplers that demonstrated superficial vein reflux, and was referred to Dr. Chrystie Nose to discuss the possibility of doing endovascular venous ablation. She was not yet ready for this type of procedure and therefore was recommended to wear compression stockings.  Interval History: She presents today saying that her palpitations have significantly improved, feeling better overall. Her major issue is that she still feels his engorged varicose veins that are tender and painful. She has a mild amount of edema but mostly just swollen varicose veins. She denies any itching or burning of her legs but does note heaviness. No restless leg type symptoms and there is no venous stasis dermatitis noted. My initial evaluation for her was about palpitations and dyspnea and labile blood pressures all of which seem to be stable.  Cardiovascular ROS: no chest pain or dyspnea on exertion positive for - edema, irregular heartbeat, palpitations and swollen, tender varicose veins negative for - chest pain, loss of consciousness, murmur, orthopnea, paroxysmal nocturnal dyspnea, rapid heart rate or shortness of breath: Additional cardiac review of systems: Lightheadedness - no, dizziness - no, syncope/near-syncope - no; TIA/amaurosis fugax - no Melena - no, hematochezia no; hematuria - no; nosebleeds - no; claudication - no  Past Medical History  Diagnosis Date  . Hypertension   . Chest pain 02/16/2010    Myoview, negative for pharmacologic-stress induced ischemia, EF 74  . Palpitation 02/02/13    A Card[facility]Net MCOT (mobile cardiac outpatient telemetry  ): NSR with PACs; sinus tachycardia with ventricular trigeminy. Brief atrial run. Facet cardiac; ventricular trigeminy and accelerated idioventricular rhythm. No significant tachyarrhythmias.  . Varicose veins of both legs with pain     Prior Cardiac Evaluation and Past Surgical History: Past Surgical History  Procedure Laterality Date  . Transthoracic echocardiogram  May 2014    Normal LV size and function. EF 55-60%. Grade 1 diastolic dysfunction. Elevated LV filling pressures. No regional W. May.  . Lower extremity venous dopplers  May 2014    No thrombus or thrombophlebitis. R Greater Saphenous Vein - enlarged without other insufficiency (6 mm); right and left Small Saphenous Vein: Patent no valve insufficiency.   No Known Allergies  MEDICATIONS REVIEWED IN EPIC  History   Social History Narrative   She and her husband are relatively active, but have not been going to [employer] as much as they used to.  They have been traveling -- just returned from [place] where they did quite a bit of walking.     ROS: A comprehensive Review of Systems - Negative except Symptoms noted above.  PHYSICAL EXAM BP 122/60  Pulse 74  Ht 5\' 6"  (1.676 m)  Wt 183 lb 1.6 oz (83.054 kg)  BMI 29.57 kg/m2 General appearance: alert, cooperative, appears stated age, no distress and borderline obese Neck: no adenopathy, no carotid bruit and no JVD Lungs: clear to auscultation bilaterally, normal percussion bilaterally and non-labored Heart: regular rate and rhythm, S1, S2 normal, no murmur, click, rub or gallop; noon-displaced PMI Abdomen: soft, non-tender; bowel sounds normal; no masses,  no organomegaly Extremities: extremities normal, atraumatic, no cyanosis, and trace edema; several engorged (tender)  varicose veins in bilateral LE with diffuse spider veins.  Pulses: 2+ and symmetric;  Skin: mobility and turgor normal ; + varicosities - lower leg(s) bilateral Neurologic: Mental status: Alert,  oriented, thought content appropriate Cranial nerves: normal (II-XII grossly intact)  JXB:[id number] today: Yes Rate:74 , Rhythm: NSR with PVCs.  Otherwise normal  Recent Labs from PCP 08/30/2013:   TC 203, TG 105, HDL 64, LDL 118. Potassium 4.8, creatinine 0.8. LFTs normal.  ASSESSMENT / PLAN: Venous insufficiency - painful varicose veins, restless legs, heaviness So far, she is not yet willing to consider invasive procedures. Her legs do seem to be hurting her more today than he had in the past. Unfortunately Dr. Rennis Golden is no longer performing endovascular ablation procedures. If she were to have worsening symptoms, I would simply referred to Dr. Tawanna Cooler Early of vascular surgery who is now performing that procedure in the office.  Essential hypertension - labile Was good today. Is not currently on any medications that control blood pressure. She is reluctant thinking medications, as long as she stays with stable blood pressures should be fine.  Palpitations Interestingly findings on her monitor. She was not interested in any type of medical therapy. She is not having to strain sensations anymore and therefore think we can just simply monitor her for worsening symptoms.    Orders Placed This Encounter  Procedures  . EKG 12-Lead   No orders of the defined types were placed in this encounter.    Followup: PRN.  If Sx of venous stasis & varicose veins worsen, would refer to Dr. Brantley Fling oF Vascular Surgery.  DAVID W. Herbie Baltimore, M.D., M.S. [facility] [address] [place], [place]  [address]  [phone] Pager # [phone]

## 2013-08-30 NOTE — Patient Instructions (Signed)
Continue with current medication  Your physician recommends that you schedule a follow-up appointment on as needed basis.

## 2013-09-01 ENCOUNTER — Encounter: Payer: Self-pay | Admitting: Cardiology

## 2013-09-01 NOTE — Assessment & Plan Note (Signed)
So far, she is not yet willing to consider invasive procedures. Her legs do seem to be hurting her more today than he had in the past. Unfortunately Dr. Rennis Golden is no longer performing endovascular ablation procedures. If she were to have worsening symptoms, I would simply referred to Dr. Tawanna Cooler Early of vascular surgery who is now performing that procedure in the office.

## 2013-09-01 NOTE — Assessment & Plan Note (Signed)
Was good today. Is not currently on any medications that control blood pressure. She is reluctant thinking medications, as long as she stays with stable blood pressures should be fine.

## 2013-09-01 NOTE — Assessment & Plan Note (Signed)
Interestingly findings on her monitor. She was not interested in any type of medical therapy. She is not having to strain sensations anymore and therefore think we can just simply monitor her for worsening symptoms.

## 2013-09-04 ENCOUNTER — Encounter: Payer: Self-pay | Admitting: Cardiology

## 2013-12-15 DIAGNOSIS — Z1231 Encounter for screening mammogram for malignant neoplasm of breast: Secondary | ICD-10-CM | POA: Diagnosis not present

## 2013-12-19 DIAGNOSIS — Z1231 Encounter for screening mammogram for malignant neoplasm of breast: Secondary | ICD-10-CM | POA: Diagnosis not present

## 2013-12-19 DIAGNOSIS — R928 Other abnormal and inconclusive findings on diagnostic imaging of breast: Secondary | ICD-10-CM | POA: Diagnosis not present

## 2014-02-08 DIAGNOSIS — Z961 Presence of intraocular lens: Secondary | ICD-10-CM | POA: Diagnosis not present

## 2014-02-08 DIAGNOSIS — H353190 Nonexudative age-related macular degeneration, unspecified eye, stage unspecified: Secondary | ICD-10-CM | POA: Diagnosis not present

## 2014-04-26 ENCOUNTER — Emergency Department (HOSPITAL_COMMUNITY): Payer: Medicare Other

## 2014-04-26 ENCOUNTER — Emergency Department (HOSPITAL_COMMUNITY)
Admission: EM | Admit: 2014-04-26 | Discharge: 2014-04-27 | Disposition: A | Discharge status: Home / Self Care | Payer: Medicare Other | Attending: Emergency Medicine | Admitting: Emergency Medicine

## 2014-04-26 ENCOUNTER — Encounter (HOSPITAL_COMMUNITY): Payer: Self-pay | Admitting: Emergency Medicine

## 2014-04-26 DIAGNOSIS — R071 Chest pain on breathing: Secondary | ICD-10-CM | POA: Diagnosis not present

## 2014-04-26 DIAGNOSIS — R002 Palpitations: Secondary | ICD-10-CM | POA: Diagnosis not present

## 2014-04-26 DIAGNOSIS — R079 Chest pain, unspecified: Secondary | ICD-10-CM | POA: Diagnosis not present

## 2014-04-26 DIAGNOSIS — R0789 Other chest pain: Secondary | ICD-10-CM

## 2014-04-26 DIAGNOSIS — Z79899 Other long term (current) drug therapy: Secondary | ICD-10-CM | POA: Insufficient documentation

## 2014-04-26 DIAGNOSIS — I10 Essential (primary) hypertension: Secondary | ICD-10-CM | POA: Diagnosis not present

## 2014-04-26 DIAGNOSIS — Z7982 Long term (current) use of aspirin: Secondary | ICD-10-CM | POA: Diagnosis not present

## 2014-04-26 DIAGNOSIS — J449 Chronic obstructive pulmonary disease, unspecified: Secondary | ICD-10-CM | POA: Diagnosis not present

## 2014-04-26 LAB — CBC WITH DIFFERENTIAL/PLATELET
Basophils Absolute: 0 10*3/uL (ref 0.0–0.1)
Basophils Relative: 0 % (ref 0–1)
Eosinophils Absolute: 0.1 10*3/uL (ref 0.0–0.7)
Eosinophils Relative: 2 % (ref 0–5)
HCT: 42.7 % (ref 36.0–46.0)
Hemoglobin: 14.3 g/dL (ref 12.0–15.0)
Lymphocytes Relative: 18 % (ref 12–46)
Lymphs Abs: 1.3 10*3/uL (ref 0.7–4.0)
MCH: 30.8 pg (ref 26.0–34.0)
MCHC: 33.5 g/dL (ref 30.0–36.0)
MCV: 92 fL (ref 78.0–100.0)
Monocytes Absolute: 0.8 10*3/uL (ref 0.1–1.0)
Monocytes Relative: 12 % (ref 3–12)
Neutro Abs: 4.7 10*3/uL (ref 1.7–7.7)
Neutrophils Relative %: 68 % (ref 43–77)
Platelets: 223 10*3/uL (ref 150–400)
RBC: 4.64 MIL/uL (ref 3.87–5.11)
RDW: 14.7 % (ref 11.5–15.5)
WBC: 6.9 10*3/uL (ref 4.0–10.5)

## 2014-04-26 LAB — COMPREHENSIVE METABOLIC PANEL
ALT: 15 U/L (ref 0–35)
AST: 18 U/L (ref 0–37)
Albumin: 3.7 g/dL (ref 3.5–5.2)
Alkaline Phosphatase: 84 U/L (ref 39–117)
Anion gap: 15 (ref 5–15)
BUN: 15 mg/dL (ref 6–23)
CO2: 21 mEq/L (ref 19–32)
Calcium: 9.1 mg/dL (ref 8.4–10.5)
Chloride: 102 mEq/L (ref 96–112)
Creatinine, Ser: 0.9 mg/dL (ref 0.50–1.10)
GFR calc Af Amer: 68 mL/min — ABNORMAL LOW (ref >90)
GFR calc non Af Amer: 58 mL/min — ABNORMAL LOW (ref >90)
Glucose, Bld: 95 mg/dL (ref 70–99)
Potassium: 4.4 mEq/L (ref 3.7–5.3)
Sodium: 138 mEq/L (ref 137–147)
Total Bilirubin: <0.2 mg/dL — ABNORMAL LOW (ref 0.3–1.2)
Total Protein: 6.8 g/dL (ref 6.0–8.3)

## 2014-04-26 LAB — I-STAT TROPONIN, ED: Troponin i, poc: 0 ng/mL (ref 0.00–0.08)

## 2014-04-26 LAB — LIPASE, BLOOD: Lipase: 44 U/L (ref 11–59)

## 2014-04-26 NOTE — ED Notes (Signed)
Patient transported to X-ray 

## 2014-04-26 NOTE — ED Notes (Signed)
Pt from home. States woke up this am with left mid axillary pain/discomfort. States pain never went away and started having sharp CP/palpitations later this evening and called EMS. Pt states she took 2 81mg  ASA this am and 325mg  AS prior to EMS arrival. Pt has history of palpitations and wore a loop recorder for a month and was cleared of any cardiac disease. Denies N/V/headache, or blurred vision.

## 2014-04-26 NOTE — ED Provider Notes (Signed)
CSN: [id number]     Arrival date & time 04/26/14  2050 History   First MD Initiated Contact with Patient 04/26/14 2101     Chief Complaint  Patient presents with  . Chest Pain     (Consider location/radiation/quality/duration/timing/severity/associated sxs/prior Treatment) HPI  Melissa Valenzuela is a 80 y.o. female with a past medical history of hypertension, chest pain, palpitations, and varicose veins, presenting for palpitations and chest pain. Patient states that she's had chest pain all day, reproduced with palpation, is sharp and follows the rib margin of her left side. Patient states she's had intermittent chest pains for the past 5 years, similarly sharp and on the left side, also toward the left side of her back. She endorses having a cough intermittently for possibly a week or more. Denies any fevers or chills, denies shortness of breath. 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 She states that she has to get repeat mammograms each year on the left side due to this abnormality. Patient states she's been worked up in the past for palpitations, was told she had some type of arrhythmia, but did not need any other intervention. Pain in left chest wall and left breast worsened with palpation.    Past Medical History  Diagnosis Date  . Hypertension   . Chest pain 02/16/2010    Myoview, negative for pharmacologic-stress induced ischemia, EF 74  . Palpitation 02/02/13    A CardioNet MCOT (mobile cardiac outpatient telemetry ): NSR with PACs; sinus tachycardia with ventricular trigeminy. Brief atrial run. Facet cardiac; ventricular trigeminy and accelerated idioventricular rhythm. No significant tachyarrhythmias.  . Varicose veins of both legs with pain    Past Surgical History  Procedure Laterality Date  . Transthoracic echocardiogram  May 2014    Normal LV size and function. EF 55-60%. Grade 1 diastolic dysfunction.  Elevated LV filling pressures. No regional W. May.  . Lower extremity venous dopplers  May 2014    No thrombus or thrombophlebitis. R Greater Saphenous Vein - enlarged without other insufficiency (6 mm); right and left Small Saphenous Vein: Patent no valve insufficiency.   Family History  Problem Relation Age of Onset  . Stroke Mother 72  . Hypertension Mother   . Colon cancer Mother 33  . Hypertension Father   . Stroke Father     2  . Hypertension Son 40    Started on BP medicine age [age]   History  Substance Use Topics  . Smoking status: Never Smoker   . Smokeless tobacco: Never Used  . Alcohol Use: 3.5 oz/week    7 drink(s) per week     Comment: occasionally   OB History   Grav Para Term Preterm Abortions TAB SAB Ect Mult Living                 Review of Systems  Constitutional: Negative.   HENT: Negative.   Eyes: Negative.   Respiratory: Negative.   Cardiovascular: Positive for chest pain and palpitations.  Gastrointestinal: Negative.   Endocrine: Negative.   Genitourinary: Negative.   Musculoskeletal: Negative.   Skin: Negative.   Allergic/Immunologic: Negative.   Neurological: Negative.   Hematological: Negative.   Psychiatric/Behavioral: Negative.       Allergies  Review of patient's allergies indicates no known allergies.  Home Medications   Prior to Admission medications   Medication Sig Start Date End Date Taking? Authorizing Provider  aspirin 81 MG tablet Take  81 mg by mouth daily.   Yes Historical Provider, MD  Calcium Citrate (CITRACAL PO) Take 1 tablet by mouth every evening.    Yes Historical Provider, MD  Multiple Vitamins-Minerals (WOMENS MULTIVITAMIN PLUS PO) Take 1 tablet by mouth daily.   Yes Historical Provider, MD  Polyethyl Glycol-Propyl Glycol (SYSTANE OP) Place 1 drop into both eyes every morning.   Yes Historical Provider, MD  Polyethyl Glycol-Propyl Glycol (SYSTANE) 0.4-0.3 % GEL Place 1 drop into both eyes at bedtime.   Yes  Historical Provider, MD   BP 135/56  Pulse 32  Temp(Src) 97.8 F (36.6 C) (Oral)  Resp 16  SpO2 100% Physical Exam  Nursing note and vitals reviewed. Constitutional: She is oriented to person, place, and time. She appears well-developed and well-nourished. No distress.  HENT:  Head: Normocephalic and atraumatic.  Right Ear: External ear normal.  Left Ear: External ear normal.  Nose: Nose normal.  Mouth/Throat: Oropharynx is clear and moist. No oropharyngeal exudate.  Eyes: Conjunctivae and EOM are normal. Pupils are equal, round, and reactive to light. Right eye exhibits no discharge. Left eye exhibits no discharge. No scleral icterus.  Neck: Normal range of motion. Neck supple. No JVD present. No tracheal deviation present. No thyromegaly present.  Cardiovascular: Normal rate, regular rhythm, normal heart sounds and intact distal pulses.  Exam reveals no gallop and no friction rub.   No murmur heard. Pulmonary/Chest: Effort normal and breath sounds normal. No stridor. No respiratory distress. She has no decreased breath sounds. She has no wheezes. She has no rhonchi. She has no rales. She exhibits tenderness and bony tenderness. Left breast exhibits mass. Left breast exhibits no inverted nipple, no nipple discharge, no skin change and no tenderness.    Abdominal: Soft. She exhibits no distension. There is no tenderness. There is no rebound and no guarding.  Musculoskeletal: Normal range of motion. She exhibits no edema and no tenderness.  Lymphadenopathy:    She has no cervical adenopathy.       Left axillary: No pectoral and no lateral adenopathy present. Neurological: She is alert and oriented to person, place, and time.  Skin: Skin is warm and dry. No rash noted. She is not diaphoretic. No erythema. No pallor.  Psychiatric: She has a normal mood and affect. Her behavior is normal. Judgment and thought content normal.    ED Course  Procedures (including critical care time) Labs  Review Labs Reviewed  COMPREHENSIVE METABOLIC PANEL - Abnormal; Notable for the following:    Total Bilirubin <0.2 (*)    GFR calc non Af Amer 58 (*)    GFR calc Af Amer 68 (*)    All other components within normal limits  LIPASE, BLOOD  CBC WITH DIFFERENTIAL  Randolm Idol, ED    Imaging Review Dg Chest 2 View  04/27/2014   CLINICAL DATA:  Left-sided chest pain.  EXAM: CHEST  2 VIEW  COMPARISON:  Chest radiograph performed 02/15/2010  FINDINGS: The lungs are hyperexpanded, with flattening of the hemidiaphragms, compatible with COPD. There is no evidence of focal opacification, pleural effusion or pneumothorax.  The heart is normal in size; the mediastinal contour is within normal limits. No acute osseous abnormalities are seen.  IMPRESSION: Findings of COPD; no acute cardiopulmonary process seen.   Electronically Signed   By: Garald Balding M.D.   On: 04/27/2014 00:01     EKG Interpretation None      MDM   Final diagnoses:  Chest wall pain  Patient has reproducible chest wall pain which has been ongoing for approximately 16 hours. Pain appears inconsistent with ACS, however could represent pleuritic process, also to be chest wall pain due to patient's cough. Low concern for PE as patient has not had any shortness of breath. Breast malignancy appears unlikely as patient has had the same issues with her breast tissue on that side for approximately 5 years. Will obtain screening ACS workup, with a single troponin value as patient's had pain throughout the day. If ACS is ruled out will likely treat with NSAIDs, for chest wall pain.  Workup studies unremarkable. Patient does have changes on her x-ray consistent with possible COPD, may be due to hyperinflation with her cough, however patient denies history of asthma, emphysema, or chronic bronchitis, and has never been a smoker. Patient does have a history of possible childhood asbestos exposure.  Patient has been evaluated in the past  by cardiology recommend further followup with PCP, and possible referral to pulmonologist due to her chronic chest wall pain and possible pulmonary process. Advised patient to use NSAIDs for her chest wall pain in the interim. Remaining laboratory workup unremarkable for ACS. No acute findings indicated in additional lab studies.  Patient and family given return precautions for chest pain.  Advised to return for worsening symptoms including chest pain, shortness of breath, severe headache, intractable nausea or vomiting, fever, or chills, inability to take medications, or other acute concerns.  Advised to follow up with PCP in 3 days.  Patient and family in agreement with and expressed understanding of follow plan, plan of care, and return precautions.  All questions answered prior to discharge.  Patient was discharged in stable condition with family, ambulating without difficulty.   Patient care was discussed with my attending, Dr. Alvino Chapel.     Hoyle Sauer, MD 04/28/14 [phone]

## 2014-04-27 DIAGNOSIS — R071 Chest pain on breathing: Secondary | ICD-10-CM | POA: Diagnosis not present

## 2014-04-27 MED ORDER — KETOROLAC TROMETHAMINE 15 MG/ML IJ SOLN
15.0000 mg | Freq: Once | INTRAMUSCULAR | Status: AC
  Administered 2014-04-27: 15 mg via INTRAVENOUS
  Filled 2014-04-27: qty 1

## 2014-04-27 NOTE — Discharge Instructions (Signed)

## 2014-04-27 NOTE — ED Provider Notes (Signed)
  Physical Exam  BP 147/70  Pulse 42  Temp(Src) 97.8 F (36.6 C) (Oral)  Resp 15  SpO2 99%  Physical Exam  ED Course  Procedures  MDM Patient with chest pain. EKG and enzymes are reassuring. Has had negative stress test. EKG shows baseline trigeminy. Will discharge home to followup as needed      Jasper Riling. Alvino Chapel, MD 04/27/14 1455

## 2014-04-28 NOTE — ED Provider Notes (Signed)
I saw and evaluated the patient, reviewed the resident's note and I agree with the findings and plan.   EKG Interpretation   Date/Time:  Thursday April 26 2014 20:54:43 EDT Ventricular Rate:  86 PR Interval:  154 QRS Duration: 96 QT Interval:  394 QTC Calculation: 471 R Axis:   51 Text Interpretation:  Sinus rhythm Ventricular trigeminy ED PHYSICIAN  INTERPRETATION AVAILABLE IN [facility] Confirmed by TEST, Record  ([id number]) on 04/28/2014 8:30:55 AM       Jasper Riling. Alvino Chapel, MD 04/28/14 1423

## 2014-04-30 DIAGNOSIS — M545 Low back pain, unspecified: Secondary | ICD-10-CM | POA: Diagnosis not present

## 2014-04-30 DIAGNOSIS — Z6830 Body mass index (BMI) 30.0-30.9, adult: Secondary | ICD-10-CM | POA: Diagnosis not present

## 2014-04-30 DIAGNOSIS — I10 Essential (primary) hypertension: Secondary | ICD-10-CM | POA: Diagnosis not present

## 2014-04-30 DIAGNOSIS — R0789 Other chest pain: Secondary | ICD-10-CM | POA: Diagnosis not present

## 2014-04-30 DIAGNOSIS — R609 Edema, unspecified: Secondary | ICD-10-CM | POA: Diagnosis not present

## 2014-05-01 ENCOUNTER — Ambulatory Visit (HOSPITAL_COMMUNITY)
Admission: RE | Admit: 2014-05-01 | Discharge: 2014-05-01 | Disposition: A | Discharge status: Home / Self Care | Payer: Medicare Other | Source: Ambulatory Visit | Attending: Vascular Surgery | Admitting: Vascular Surgery

## 2014-05-01 ENCOUNTER — Other Ambulatory Visit (HOSPITAL_COMMUNITY): Payer: Self-pay | Admitting: Internal Medicine

## 2014-05-01 DIAGNOSIS — R609 Edema, unspecified: Secondary | ICD-10-CM | POA: Insufficient documentation

## 2014-05-01 NOTE — Progress Notes (Signed)
Preliminary report given to [facility] @ Dr. Shon Baton office.

## 2014-05-09 DIAGNOSIS — M810 Age-related osteoporosis without current pathological fracture: Secondary | ICD-10-CM | POA: Diagnosis not present

## 2014-05-15 DIAGNOSIS — I10 Essential (primary) hypertension: Secondary | ICD-10-CM | POA: Diagnosis not present

## 2014-05-17 DIAGNOSIS — I10 Essential (primary) hypertension: Secondary | ICD-10-CM | POA: Diagnosis not present

## 2014-05-22 DIAGNOSIS — R82998 Other abnormal findings in urine: Secondary | ICD-10-CM | POA: Diagnosis not present

## 2014-05-22 DIAGNOSIS — M810 Age-related osteoporosis without current pathological fracture: Secondary | ICD-10-CM | POA: Diagnosis not present

## 2014-05-22 DIAGNOSIS — R809 Proteinuria, unspecified: Secondary | ICD-10-CM | POA: Diagnosis not present

## 2014-05-22 DIAGNOSIS — I10 Essential (primary) hypertension: Secondary | ICD-10-CM | POA: Diagnosis not present

## 2014-05-22 DIAGNOSIS — E785 Hyperlipidemia, unspecified: Secondary | ICD-10-CM | POA: Diagnosis not present

## 2014-05-28 DIAGNOSIS — Z1331 Encounter for screening for depression: Secondary | ICD-10-CM | POA: Diagnosis not present

## 2014-05-28 DIAGNOSIS — Z6830 Body mass index (BMI) 30.0-30.9, adult: Secondary | ICD-10-CM | POA: Diagnosis not present

## 2014-05-28 DIAGNOSIS — Z79899 Other long term (current) drug therapy: Secondary | ICD-10-CM | POA: Diagnosis not present

## 2014-05-28 DIAGNOSIS — Z0000 Encounter for general adult medical examination without abnormal findings: Secondary | ICD-10-CM | POA: Diagnosis not present

## 2014-05-28 DIAGNOSIS — M810 Age-related osteoporosis without current pathological fracture: Secondary | ICD-10-CM | POA: Diagnosis not present

## 2014-05-28 DIAGNOSIS — Z23 Encounter for immunization: Secondary | ICD-10-CM | POA: Diagnosis not present

## 2014-05-28 DIAGNOSIS — I10 Essential (primary) hypertension: Secondary | ICD-10-CM | POA: Diagnosis not present

## 2014-05-28 DIAGNOSIS — M25579 Pain in unspecified ankle and joints of unspecified foot: Secondary | ICD-10-CM | POA: Diagnosis not present

## 2014-05-28 DIAGNOSIS — R609 Edema, unspecified: Secondary | ICD-10-CM | POA: Diagnosis not present

## 2014-06-05 DIAGNOSIS — Z1212 Encounter for screening for malignant neoplasm of rectum: Secondary | ICD-10-CM | POA: Diagnosis not present

## 2014-06-07 DIAGNOSIS — Z01419 Encounter for gynecological examination (general) (routine) without abnormal findings: Secondary | ICD-10-CM | POA: Diagnosis not present

## 2014-06-07 DIAGNOSIS — M25579 Pain in unspecified ankle and joints of unspecified foot: Secondary | ICD-10-CM | POA: Diagnosis not present

## 2014-06-07 DIAGNOSIS — M25879 Other specified joint disorders, unspecified ankle and foot: Secondary | ICD-10-CM | POA: Diagnosis not present

## 2014-06-07 DIAGNOSIS — S93409A Sprain of unspecified ligament of unspecified ankle, initial encounter: Secondary | ICD-10-CM | POA: Diagnosis not present

## 2014-06-08 DIAGNOSIS — Z23 Encounter for immunization: Secondary | ICD-10-CM | POA: Diagnosis not present

## 2014-06-19 DIAGNOSIS — S93401D Sprain of unspecified ligament of right ankle, subsequent encounter: Secondary | ICD-10-CM | POA: Diagnosis not present

## 2014-06-25 DIAGNOSIS — S93401D Sprain of unspecified ligament of right ankle, subsequent encounter: Secondary | ICD-10-CM | POA: Diagnosis not present

## 2014-06-27 DIAGNOSIS — M25571 Pain in right ankle and joints of right foot: Secondary | ICD-10-CM | POA: Diagnosis not present

## 2014-07-02 DIAGNOSIS — S93401D Sprain of unspecified ligament of right ankle, subsequent encounter: Secondary | ICD-10-CM | POA: Diagnosis not present

## 2014-07-02 DIAGNOSIS — M25871 Other specified joint disorders, right ankle and foot: Secondary | ICD-10-CM | POA: Diagnosis not present

## 2014-07-02 DIAGNOSIS — M25571 Pain in right ankle and joints of right foot: Secondary | ICD-10-CM | POA: Diagnosis not present

## 2014-07-02 DIAGNOSIS — M25471 Effusion, right ankle: Secondary | ICD-10-CM | POA: Diagnosis not present

## 2014-07-18 DIAGNOSIS — L821 Other seborrheic keratosis: Secondary | ICD-10-CM | POA: Diagnosis not present

## 2014-07-18 DIAGNOSIS — L814 Other melanin hyperpigmentation: Secondary | ICD-10-CM | POA: Diagnosis not present

## 2014-07-18 DIAGNOSIS — L570 Actinic keratosis: Secondary | ICD-10-CM | POA: Diagnosis not present

## 2014-07-25 DIAGNOSIS — M25871 Other specified joint disorders, right ankle and foot: Secondary | ICD-10-CM | POA: Diagnosis not present

## 2014-07-25 DIAGNOSIS — M25471 Effusion, right ankle: Secondary | ICD-10-CM | POA: Diagnosis not present

## 2014-07-25 DIAGNOSIS — S93401D Sprain of unspecified ligament of right ankle, subsequent encounter: Secondary | ICD-10-CM | POA: Diagnosis not present

## 2014-07-25 DIAGNOSIS — M25571 Pain in right ankle and joints of right foot: Secondary | ICD-10-CM | POA: Diagnosis not present

## 2014-08-02 DIAGNOSIS — S93401D Sprain of unspecified ligament of right ankle, subsequent encounter: Secondary | ICD-10-CM | POA: Diagnosis not present

## 2014-08-08 DIAGNOSIS — S93401D Sprain of unspecified ligament of right ankle, subsequent encounter: Secondary | ICD-10-CM | POA: Diagnosis not present

## 2014-08-13 DIAGNOSIS — S93401D Sprain of unspecified ligament of right ankle, subsequent encounter: Secondary | ICD-10-CM | POA: Diagnosis not present

## 2014-08-17 DIAGNOSIS — N950 Postmenopausal bleeding: Secondary | ICD-10-CM | POA: Diagnosis not present

## 2014-08-20 DIAGNOSIS — S93401D Sprain of unspecified ligament of right ankle, subsequent encounter: Secondary | ICD-10-CM | POA: Diagnosis not present

## 2014-08-22 DIAGNOSIS — M25571 Pain in right ankle and joints of right foot: Secondary | ICD-10-CM | POA: Diagnosis not present

## 2014-08-22 DIAGNOSIS — N950 Postmenopausal bleeding: Secondary | ICD-10-CM | POA: Diagnosis not present

## 2014-08-22 DIAGNOSIS — S93401D Sprain of unspecified ligament of right ankle, subsequent encounter: Secondary | ICD-10-CM | POA: Diagnosis not present

## 2014-08-22 DIAGNOSIS — M25871 Other specified joint disorders, right ankle and foot: Secondary | ICD-10-CM | POA: Diagnosis not present

## 2014-08-27 DIAGNOSIS — S93401D Sprain of unspecified ligament of right ankle, subsequent encounter: Secondary | ICD-10-CM | POA: Diagnosis not present

## 2014-08-29 DIAGNOSIS — S93401D Sprain of unspecified ligament of right ankle, subsequent encounter: Secondary | ICD-10-CM | POA: Diagnosis not present

## 2014-09-25 DIAGNOSIS — H35372 Puckering of macula, left eye: Secondary | ICD-10-CM | POA: Diagnosis not present

## 2014-09-25 DIAGNOSIS — H35352 Cystoid macular degeneration, left eye: Secondary | ICD-10-CM | POA: Diagnosis not present

## 2014-09-25 DIAGNOSIS — H33102 Unspecified retinoschisis, left eye: Secondary | ICD-10-CM | POA: Diagnosis not present

## 2014-09-25 DIAGNOSIS — H43811 Vitreous degeneration, right eye: Secondary | ICD-10-CM | POA: Diagnosis not present

## 2014-09-25 DIAGNOSIS — H43812 Vitreous degeneration, left eye: Secondary | ICD-10-CM | POA: Diagnosis not present

## 2014-09-28 DIAGNOSIS — I10 Essential (primary) hypertension: Secondary | ICD-10-CM | POA: Diagnosis not present

## 2014-09-28 DIAGNOSIS — E785 Hyperlipidemia, unspecified: Secondary | ICD-10-CM | POA: Diagnosis not present

## 2014-09-28 DIAGNOSIS — R002 Palpitations: Secondary | ICD-10-CM | POA: Diagnosis not present

## 2014-09-28 DIAGNOSIS — Z6830 Body mass index (BMI) 30.0-30.9, adult: Secondary | ICD-10-CM | POA: Diagnosis not present

## 2014-09-28 DIAGNOSIS — Z01818 Encounter for other preprocedural examination: Secondary | ICD-10-CM | POA: Diagnosis not present

## 2014-10-11 DIAGNOSIS — S60211A Contusion of right wrist, initial encounter: Secondary | ICD-10-CM | POA: Diagnosis not present

## 2014-10-11 DIAGNOSIS — M25531 Pain in right wrist: Secondary | ICD-10-CM | POA: Diagnosis not present

## 2014-10-11 DIAGNOSIS — S63501A Unspecified sprain of right wrist, initial encounter: Secondary | ICD-10-CM | POA: Diagnosis not present

## 2014-10-25 DIAGNOSIS — S63501D Unspecified sprain of right wrist, subsequent encounter: Secondary | ICD-10-CM | POA: Diagnosis not present

## 2014-10-25 DIAGNOSIS — M25531 Pain in right wrist: Secondary | ICD-10-CM | POA: Diagnosis not present

## 2014-10-25 DIAGNOSIS — M25631 Stiffness of right wrist, not elsewhere classified: Secondary | ICD-10-CM | POA: Diagnosis not present

## 2014-10-30 ENCOUNTER — Encounter (HOSPITAL_COMMUNITY): Payer: Self-pay

## 2014-10-30 ENCOUNTER — Encounter (HOSPITAL_COMMUNITY)
Admission: RE | Admit: 2014-10-30 | Discharge: 2014-10-30 | Disposition: A | Discharge status: Home / Self Care | Payer: Medicare Other | Source: Ambulatory Visit | Attending: Obstetrics and Gynecology | Admitting: Obstetrics and Gynecology

## 2014-10-30 DIAGNOSIS — R938 Abnormal findings on diagnostic imaging of other specified body structures: Secondary | ICD-10-CM | POA: Diagnosis not present

## 2014-10-30 DIAGNOSIS — Z6830 Body mass index (BMI) 30.0-30.9, adult: Secondary | ICD-10-CM | POA: Diagnosis not present

## 2014-10-30 DIAGNOSIS — I739 Peripheral vascular disease, unspecified: Secondary | ICD-10-CM | POA: Diagnosis not present

## 2014-10-30 DIAGNOSIS — E669 Obesity, unspecified: Secondary | ICD-10-CM | POA: Diagnosis not present

## 2014-10-30 DIAGNOSIS — N924 Excessive bleeding in the premenopausal period: Secondary | ICD-10-CM | POA: Diagnosis not present

## 2014-10-30 DIAGNOSIS — Z8249 Family history of ischemic heart disease and other diseases of the circulatory system: Secondary | ICD-10-CM | POA: Diagnosis not present

## 2014-10-30 DIAGNOSIS — I10 Essential (primary) hypertension: Secondary | ICD-10-CM | POA: Diagnosis not present

## 2014-10-30 HISTORY — DX: Cardiac arrhythmia, unspecified: I49.9

## 2014-10-30 HISTORY — DX: Adverse effect of unspecified anesthetic, initial encounter: T41.45XA

## 2014-10-30 LAB — COMPREHENSIVE METABOLIC PANEL
ALT: 40 U/L — ABNORMAL HIGH (ref 0–35)
AST: 32 U/L (ref 0–37)
Albumin: 4.3 g/dL (ref 3.5–5.2)
Alkaline Phosphatase: 81 U/L (ref 39–117)
Anion gap: 5 (ref 5–15)
BUN: 19 mg/dL (ref 6–23)
CO2: 24 mmol/L (ref 19–32)
Calcium: 9.5 mg/dL (ref 8.4–10.5)
Chloride: 108 mmol/L (ref 96–112)
Creatinine, Ser: 0.86 mg/dL (ref 0.50–1.10)
GFR calc Af Amer: 71 mL/min — ABNORMAL LOW (ref >90)
GFR calc non Af Amer: 62 mL/min — ABNORMAL LOW (ref >90)
Glucose, Bld: 90 mg/dL (ref 70–99)
Potassium: 4.5 mmol/L (ref 3.5–5.1)
Sodium: 137 mmol/L (ref 135–145)
Total Bilirubin: 0.4 mg/dL (ref 0.3–1.2)
Total Protein: 7.2 g/dL (ref 6.0–8.3)

## 2014-10-30 LAB — CBC
HCT: 44.8 % (ref 36.0–46.0)
Hemoglobin: 14.9 g/dL (ref 12.0–15.0)
MCH: 30.6 pg (ref 26.0–34.0)
MCHC: 33.3 g/dL (ref 30.0–36.0)
MCV: 92 fL (ref 78.0–100.0)
Platelets: 232 10*3/uL (ref 150–400)
RBC: 4.87 MIL/uL (ref 3.87–5.11)
RDW: 14 % (ref 11.5–15.5)
WBC: 6.2 10*3/uL (ref 4.0–10.5)

## 2014-10-30 NOTE — Patient Instructions (Signed)
Your procedure is scheduled on:11/02/14  Enter through the Main Entrance at : 7:30 am  Pick up desk phone and dial [phone] and inform us of your arrival.  Please call [phone] if you have any problems the morning of surgery.  Remember: Do not eat food or drink liquids, including water, after midnight:Thursday  You may brush your teeth the morning of surgery  DO NOT wear jewelry, eye make-up, lipstick,body lotion, or dark fingernail polish.  (Polished toes are ok) You may wear deodorant.  If you are to be admitted after surgery, leave suitcase in car until your room has been assigned. Patients discharged on the day of surgery will not be allowed to drive home. Wear loose fitting, comfortable clothes for your ride home.

## 2014-11-01 ENCOUNTER — Encounter (HOSPITAL_COMMUNITY): Payer: Self-pay | Admitting: Obstetrics and Gynecology

## 2014-11-01 DIAGNOSIS — M25531 Pain in right wrist: Secondary | ICD-10-CM | POA: Diagnosis not present

## 2014-11-01 DIAGNOSIS — N950 Postmenopausal bleeding: Secondary | ICD-10-CM

## 2014-11-01 HISTORY — DX: Postmenopausal bleeding: N95.0

## 2014-11-01 NOTE — H&P (Addendum)
Melissa Valenzuela is an 81 y.o. female with Postmenopausal VB, attempted EMB in office unsuccessful.  On [place] endometrial stripe noted to be 1.1-1.4 cm.  D/W pt r/b/a.  Pt also endores maternal h/o uterine polyps.  No hormonal use.    Pertinent Gynecological History: Menses: post-menopausal Bleeding: post menopausal bleeding Contraception: post menopausal status Sexually transmitted diseases: no past history Previous GYN Procedures: LTCS x 4, salpingectomy secondary to ectopic  Last mammogram: normal Last pap: normal  OB History: [id number]   Menstrual History: No LMP recorded. Patient is postmenopausal.    Past Medical History  Diagnosis Date  . Chest pain 02/16/2010    Myoview, negative for pharmacologic-stress induced ischemia, EF 74  . Palpitation 02/02/13    A CardioNet MCOT (mobile cardiac outpatient telemetry ): NSR with PACs; sinus tachycardia with ventricular trigeminy. Brief atrial run. Facet cardiac; ventricular trigeminy and accelerated idioventricular rhythm. No significant tachyarrhythmias.  . Varicose veins of both legs with pain   . Hypertension     no meds currently  . Complication of anesthesia 1954    hard to wake up  . Dysrhythmia     h/o PVCs- none recently- no meds  . Postmenopausal bleeding 11/01/2014    Past Surgical History  Procedure Laterality Date  . Transthoracic echocardiogram  May 2014    Normal LV size and function. EF 55-60%. Grade 1 diastolic dysfunction. Elevated LV filling pressures. No regional W. May.  . Lower extremity venous dopplers  May 2014    No thrombus or thrombophlebitis. R Greater Saphenous Vein - enlarged without other insufficiency (6 mm); right and left Small Saphenous Vein: Patent no valve insufficiency.  . Cesarean section  1962, 1963, [id number]    x 4  . Tonsillectomy    . Diagnostic laparoscopy    . Appendectomy    . Fracture surgery    . Cataract extraction      both eyes  salpingectomy  Family History  Problem Relation  Age of Onset  . Stroke Mother 32  . Hypertension Mother   . Colon cancer Mother 57  . Hypertension Father   . Stroke Father     2  . Hypertension Son 8    Started on BP medicine age 3/24    Social History:  reports that she has never smoked. She has never used smokeless tobacco. She reports that she drinks about 3.5 oz of alcohol per week. She reports that she does not use illicit drugs.married, retired  Allergies: No Known Allergies Meds Calcium, MVI, tramadol  Review of Systems  Constitutional: Negative.   HENT: Negative.   Eyes: Negative.   Respiratory: Negative.   Cardiovascular: Negative.   Gastrointestinal: Negative.   Genitourinary: Negative.   Musculoskeletal: Negative.   Skin: Negative.   Neurological: Negative.   Psychiatric/Behavioral: Negative.     There were no vitals taken for this visit. Physical Exam  Constitutional: She is oriented to person, place, and time. She appears well-developed and well-nourished.  HENT:  Head: Normocephalic and atraumatic.  Cardiovascular: Normal rate and regular rhythm.   Respiratory: Effort normal. No respiratory distress. She has no wheezes.  GI: Soft. Bowel sounds are normal. There is no tenderness.  Musculoskeletal: Normal range of motion.  Neurological: She is alert and oriented to person, place, and time.  Skin: Skin is warm and dry.  Psychiatric: She has a normal mood and affect. Her behavior is normal.    No results found for this or any previous visit (from the past  24 hour(s)).  No results found. US shows thickened EMS, nl small uterus, nl left ovary with small simple cyst, R ovary not Id'd Assessment/Plan: 81yo with PMB, thickened EMS on TVUS D/w pt r/b/a of D&C hsteroscopy, wishes to proceed Given cytotec to soften cervix.  Bovard-Stuckert, Treyshon Buchanon 11/01/2014, 10:15 PM

## 2014-11-02 ENCOUNTER — Encounter (HOSPITAL_COMMUNITY): Payer: Self-pay | Admitting: Anesthesiology

## 2014-11-02 ENCOUNTER — Ambulatory Visit (HOSPITAL_COMMUNITY)
Admission: RE | Admit: 2014-11-02 | Discharge: 2014-11-02 | Disposition: A | Discharge status: Home / Self Care | Payer: Medicare Other | Source: Ambulatory Visit | Attending: Obstetrics and Gynecology | Admitting: Obstetrics and Gynecology

## 2014-11-02 ENCOUNTER — Encounter (HOSPITAL_COMMUNITY)
Admission: RE | Disposition: A | Discharge status: Home / Self Care | Payer: Self-pay | Source: Ambulatory Visit | Attending: Obstetrics and Gynecology

## 2014-11-02 ENCOUNTER — Ambulatory Visit (HOSPITAL_COMMUNITY): Payer: Medicare Other | Admitting: Anesthesiology

## 2014-11-02 DIAGNOSIS — N924 Excessive bleeding in the premenopausal period: Secondary | ICD-10-CM | POA: Insufficient documentation

## 2014-11-02 DIAGNOSIS — Z6830 Body mass index (BMI) 30.0-30.9, adult: Secondary | ICD-10-CM | POA: Diagnosis not present

## 2014-11-02 DIAGNOSIS — R938 Abnormal findings on diagnostic imaging of other specified body structures: Secondary | ICD-10-CM | POA: Insufficient documentation

## 2014-11-02 DIAGNOSIS — Z8249 Family history of ischemic heart disease and other diseases of the circulatory system: Secondary | ICD-10-CM | POA: Insufficient documentation

## 2014-11-02 DIAGNOSIS — I739 Peripheral vascular disease, unspecified: Secondary | ICD-10-CM | POA: Insufficient documentation

## 2014-11-02 DIAGNOSIS — E669 Obesity, unspecified: Secondary | ICD-10-CM | POA: Diagnosis not present

## 2014-11-02 DIAGNOSIS — I10 Essential (primary) hypertension: Secondary | ICD-10-CM | POA: Diagnosis not present

## 2014-11-02 DIAGNOSIS — N950 Postmenopausal bleeding: Secondary | ICD-10-CM | POA: Diagnosis present

## 2014-11-02 HISTORY — DX: Postmenopausal bleeding: N95.0

## 2014-11-02 HISTORY — PX: HYSTEROSCOPY WITH D & C: SHX1775

## 2014-11-02 SURGERY — DILATATION AND CURETTAGE /HYSTEROSCOPY
Anesthesia: General | Site: Vagina

## 2014-11-02 MED ORDER — OXYCODONE-ACETAMINOPHEN 5-325 MG PO TABS: 1.0000 | ORAL_TABLET | Freq: Four times a day (QID) | ORAL | Status: DC | PRN

## 2014-11-02 MED ORDER — FENTANYL CITRATE 0.05 MG/ML IJ SOLN
INTRAMUSCULAR | Status: AC
  Filled 2014-11-02: qty 2

## 2014-11-02 MED ORDER — IBUPROFEN 600 MG PO TABS: 600.0000 mg | ORAL_TABLET | Freq: Four times a day (QID) | ORAL | Status: DC | PRN

## 2014-11-02 MED ORDER — LIDOCAINE HCL 1 % IJ SOLN
INTRAMUSCULAR | Status: AC
  Filled 2014-11-02: qty 20

## 2014-11-02 MED ORDER — DEXAMETHASONE SODIUM PHOSPHATE 4 MG/ML IJ SOLN
INTRAMUSCULAR | Status: AC
  Filled 2014-11-02: qty 1

## 2014-11-02 MED ORDER — LIDOCAINE HCL 1 % IJ SOLN
INTRAMUSCULAR | Status: DC | PRN
  Administered 2014-11-02: 16 mL

## 2014-11-02 MED ORDER — MEPERIDINE HCL 25 MG/ML IJ SOLN: 6.2500 mg | INTRAMUSCULAR | Status: DC | PRN

## 2014-11-02 MED ORDER — FENTANYL CITRATE 0.05 MG/ML IJ SOLN
INTRAMUSCULAR | Status: AC
  Administered 2014-11-02: 25 ug via INTRAVENOUS
  Filled 2014-11-02: qty 2

## 2014-11-02 MED ORDER — DEXAMETHASONE SODIUM PHOSPHATE 4 MG/ML IJ SOLN
INTRAMUSCULAR | Status: DC | PRN
  Administered 2014-11-02: 4 mg via INTRAVENOUS

## 2014-11-02 MED ORDER — LACTATED RINGERS IV SOLN
INTRAVENOUS | Status: DC
  Administered 2014-11-02: 08:00:00 via INTRAVENOUS

## 2014-11-02 MED ORDER — LIDOCAINE HCL (CARDIAC) 20 MG/ML IV SOLN
INTRAVENOUS | Status: DC | PRN
  Administered 2014-11-02: 50 mg via INTRAVENOUS

## 2014-11-02 MED ORDER — LIDOCAINE HCL (CARDIAC) 20 MG/ML IV SOLN
INTRAVENOUS | Status: AC
  Filled 2014-11-02: qty 5

## 2014-11-02 MED ORDER — ONDANSETRON HCL 4 MG/2ML IJ SOLN
INTRAMUSCULAR | Status: AC
  Filled 2014-11-02: qty 2

## 2014-11-02 MED ORDER — FENTANYL CITRATE 0.05 MG/ML IJ SOLN
25.0000 ug | INTRAMUSCULAR | Status: DC | PRN
  Administered 2014-11-02: 50 ug via INTRAVENOUS
  Administered 2014-11-02: 25 ug via INTRAVENOUS

## 2014-11-02 MED ORDER — LACTATED RINGERS IV SOLN: INTRAVENOUS | Status: DC

## 2014-11-02 MED ORDER — GLYCINE 1.5 % IR SOLN
Status: DC | PRN
  Administered 2014-11-02: 3000 mL

## 2014-11-02 MED ORDER — ONDANSETRON HCL 4 MG/2ML IJ SOLN
INTRAMUSCULAR | Status: DC | PRN
  Administered 2014-11-02: 4 mg via INTRAVENOUS

## 2014-11-02 MED ORDER — PROPOFOL 10 MG/ML IV BOLUS
INTRAVENOUS | Status: AC
  Filled 2014-11-02: qty 20

## 2014-11-02 MED ORDER — PROPOFOL 10 MG/ML IV BOLUS
INTRAVENOUS | Status: DC | PRN
  Administered 2014-11-02: 150 mg via INTRAVENOUS

## 2014-11-02 MED ORDER — FENTANYL CITRATE 0.05 MG/ML IJ SOLN
INTRAMUSCULAR | Status: DC | PRN
  Administered 2014-11-02: 50 ug via INTRAVENOUS

## 2014-11-02 SURGICAL SUPPLY — 18 items
ABLATOR ENDOMETRIAL BIPOLAR (ABLATOR) IMPLANT
CANISTER SUCT 3000ML (MISCELLANEOUS) ×2 IMPLANT
CATH ROBINSON RED A/P 16FR (CATHETERS) ×2 IMPLANT
CLOTH BEACON ORANGE TIMEOUT ST (SAFETY) ×2 IMPLANT
CONTAINER PREFILL 10% NBF 60ML (FORM) ×4 IMPLANT
DILATOR CANAL MILEX (MISCELLANEOUS) ×2 IMPLANT
ELECT REM PT RETURN 9FT ADLT (ELECTROSURGICAL) ×2
ELECTRODE REM PT RTRN 9FT ADLT (ELECTROSURGICAL) ×1 IMPLANT
GLOVE BIO SURGEON STRL SZ 6.5 (GLOVE) ×2 IMPLANT
GLOVE INDICATOR 7.0 STRL GRN (GLOVE) ×2 IMPLANT
GOWN STRL REUS W/TWL LRG LVL3 (GOWN DISPOSABLE) ×4 IMPLANT
LOOP ANGLED CUTTING 22FR (CUTTING LOOP) IMPLANT
PACK VAGINAL MINOR WOMEN LF (CUSTOM PROCEDURE TRAY) ×2 IMPLANT
PAD OB MATERNITY 4.3X12.25 (PERSONAL CARE ITEMS) ×2 IMPLANT
TOWEL OR 17X24 6PK STRL BLUE (TOWEL DISPOSABLE) ×4 IMPLANT
TUBING AQUILEX INFLOW (TUBING) ×2 IMPLANT
TUBING AQUILEX OUTFLOW (TUBING) ×2 IMPLANT
WATER STERILE IRR 1000ML POUR (IV SOLUTION) ×2 IMPLANT

## 2014-11-02 NOTE — Brief Op Note (Signed)
11/02/2014  10:03 AM  PATIENT:  Melissa Valenzuela  81 y.o. female  PRE-OPERATIVE DIAGNOSIS:  Thickened Endometrium, PMB  POST-OPERATIVE DIAGNOSIS:  Thickened endometruim, PMB  PROCEDURE:  Procedure(s): DILATATION AND CURETTAGE /HYSTEROSCOPY (N/A)  SURGEON:  Surgeon(s) and Role:    * Janyth Contes, MD - Primary  ANESTHESIA:   local and general  EBL:  Total I/O In: 800 [I.V.:800] Out: 225 [Urine:200; Blood:25]  BLOOD ADMINISTERED:none  DRAINS: none   LOCAL MEDICATIONS USED:  LIDOCAINE   SPECIMEN:  Source of Specimen:  Encometrial currettings  DISPOSITION OF SPECIMEN:  PATHOLOGY  COUNTS:  YES  TOURNIQUET:  * No tourniquets in log *  DICTATION: .Other Dictation: Dictation Number [id number]  PLAN OF CARE: Discharge to home after PACU  PATIENT DISPOSITION:  PACU - hemodynamically stable.   Delay start of Pharmacological VTE agent (>24hrs) due to surgical blood loss or risk of bleeding: not applicable

## 2014-11-02 NOTE — Anesthesia Preprocedure Evaluation (Signed)
Anesthesia Evaluation  Patient identified by MRN, date of birth, ID band Patient awake    Reviewed: Allergy & Precautions, NPO status , Patient's Chart, lab work & pertinent test results, reviewed documented beta blocker date and time   History of Anesthesia Complications (+) PROLONGED EMERGENCE and history of anesthetic complications  Airway Mallampati: II  TM Distance: >3 FB Neck ROM: Full    Dental no notable dental hx. (+) Teeth Intact   Pulmonary neg pulmonary ROS,  breath sounds clear to auscultation  Pulmonary exam normal       Cardiovascular hypertension, Pt. on medications + Peripheral Vascular Disease + dysrhythmias Rhythm:Regular Rate:Normal     Neuro/Psych negative neurological ROS  negative psych ROS   GI/Hepatic negative GI ROS, Neg liver ROS,   Endo/Other  negative endocrine ROS  Renal/GU negative Renal ROS  negative genitourinary   Musculoskeletal negative musculoskeletal ROS (+)   Abdominal (+) + obese,   Peds  Hematology negative hematology ROS (+)   Anesthesia Other Findings   Reproductive/Obstetrics Thickened endometrium PMB                              Anesthesia Physical Anesthesia Plan  ASA: II  Anesthesia Plan: General   Post-op Pain Management:    Induction: Intravenous  Airway Management Planned: LMA  Additional Equipment:   Intra-op Plan:   Post-operative Plan: Extubation in OR  Informed Consent: I have reviewed the patients History and Physical, chart, labs and discussed the procedure including the risks, benefits and alternatives for the proposed anesthesia with the patient or authorized representative who has indicated his/her understanding and acceptance.   Dental advisory given  Plan Discussed with: Anesthesiologist, CRNA and Surgeon  Anesthesia Plan Comments:         Anesthesia Quick Evaluation

## 2014-11-02 NOTE — Anesthesia Postprocedure Evaluation (Signed)
  Anesthesia Post-op Note  Patient: Melissa Valenzuela  Procedure(s) Performed: Procedure(s): DILATATION AND CURETTAGE /HYSTEROSCOPY (N/A)  Patient Location: PACU  Anesthesia Type:General  Level of Consciousness: awake, alert  and oriented  Airway and Oxygen Therapy: Patient Spontanous Breathing  Post-op Pain: none  Post-op Assessment: Post-op Vital signs reviewed, Patient's Cardiovascular Status Stable, Respiratory Function Stable, Patent Airway, No signs of Nausea or vomiting and Pain level controlled  Post-op Vital Signs: Reviewed and stable  Last Vitals:  Filed Vitals:   11/02/14 1030  BP: 177/71  Pulse: 63  Temp:   Resp: 20    Complications: No apparent anesthesia complications

## 2014-11-02 NOTE — Discharge Instructions (Signed)

## 2014-11-02 NOTE — Interval H&P Note (Signed)
History and Physical Interval Note:  11/02/2014 8:52 AM  Melissa Valenzuela  has presented today for surgery, with the diagnosis of [place] Endometrium,   The various methods of treatment have been discussed with the patient and family. After consideration of risks, benefits and other options for treatment, the patient has consented to  Procedure(s): DILATATION AND CURETTAGE /HYSTEROSCOPY (N/A) as a surgical intervention .  The patient's history has been reviewed, patient examined, no change in status, stable for surgery.  I have reviewed the patient's chart and labs.  Questions were answered to the patient's satisfaction.     Bovard-Stuckert, Honestee Revard

## 2014-11-02 NOTE — Transfer of Care (Signed)
Immediate Anesthesia Transfer of Care Note  Patient: Melissa Valenzuela  Procedure(s) Performed: Procedure(s): DILATATION AND CURETTAGE /HYSTEROSCOPY (N/A)  Patient Location: PACU  Anesthesia Type:General  Level of Consciousness: awake, alert  and oriented  Airway & Oxygen Therapy: Patient Spontanous Breathing and Patient connected to nasal cannula oxygen  Post-op Assessment: Report given to RN  Post vital signs: Reviewed  Last Vitals:  Filed Vitals:   11/02/14 0732  BP: 152/90  Pulse: 80  Temp: 36.5 C  Resp: 18    Complications: No apparent anesthesia complications

## 2014-11-03 NOTE — Op Note (Signed)
Melissa Valenzuela, Melissa Valenzuela NO.:  [id number]  MEDICAL RECORD NO.:  [id number]  LOCATION:  [facility]                          FACILITY:  [facility]  PHYSICIAN:  Thornell Sartorius, MD        DATE OF BIRTH:  [birth year]  DATE OF PROCEDURE:  11/02/2014 DATE OF DISCHARGE:  11/02/2014                              OPERATIVE REPORT   PREOPERATIVE DIAGNOSIS:  Thickened endometrium, postmenopausal bleeding.  POSTOPERATIVE DIAGNOSIS:  Thickened endometrium, postmenopausal bleeding.  PROCEDURE:  Hysteroscopy, D and C.  SURGEON:  Thornell Sartorius, MD  ANESTHESIA:  General with local for paracervical block.  IV FLUIDS:  800 mL.  URINE OUTPUT:  200 mL.  EBL:  Approximately 25 mL.  COMPLICATIONS:  None.  PATHOLOGY:  Endometrial curettings to pathology.  PROCEDURE IN DETAIL:  After informed consent was reviewed with the patient and her husband including risks, benefits, and alternatives of the surgical procedure, she was taken to the operating room, placed on the table in supine position.  Anesthesia was induced and found be adequate.  She was then placed in the [place] stirrups.  Prepped and draped in the normal sterile fashion.  Her bladder was sterilely drained.  Using an open-sided speculum, her cervix was easily visualized and grasped with a single-tooth tenaculum and a paracervical block was placed with 16 mL of 1% lidocaine.  Her cervix was then dilated initially with lacrimal probes and then to accommodate the hysteroscope to approximately 19-French.  The hysteroscope was introduced.  Her uterus was found to be small.  The ostia were identified on the patient's right.  On the left, there was unable to be identified.  In her uterine cavity, there was a cobwebby appearance to the endometrium. The uterus was sharply curettaged and again visualized.  Some of this area had been reduced, some of it persistent.  Several rounds of curetting and visualizing with the hysteroscope was performed.   A small amount of the cobweb-like liming persisted.  However, it was felt that a good sample of the endometrium was obtained.  The scope and tenaculum were removed.  Everything was noted to be hemostatic.  Patient was returned to supine position, awakened in stable condition.  She tolerated the procedure well.  Sponge, lap, and needle counts correct x2 per the operating staff.     Thornell Sartorius, MD     JB/MEDQ  D:  11/02/2014  T:  11/03/2014  Job:  [id number]

## 2014-11-05 ENCOUNTER — Encounter (HOSPITAL_COMMUNITY): Payer: Self-pay | Admitting: Obstetrics and Gynecology

## 2014-11-07 ENCOUNTER — Inpatient Hospital Stay (HOSPITAL_COMMUNITY)
Admission: AD | Admit: 2014-11-07 | Discharge: 2014-11-07 | Disposition: A | Discharge status: Home / Self Care | Payer: Medicare Other | Source: Ambulatory Visit | Attending: Obstetrics and Gynecology | Admitting: Obstetrics and Gynecology

## 2014-11-07 DIAGNOSIS — I10 Essential (primary) hypertension: Secondary | ICD-10-CM

## 2014-11-07 DIAGNOSIS — Z6830 Body mass index (BMI) 30.0-30.9, adult: Secondary | ICD-10-CM | POA: Diagnosis not present

## 2014-11-07 DIAGNOSIS — R42 Dizziness and giddiness: Secondary | ICD-10-CM | POA: Diagnosis not present

## 2014-11-07 NOTE — MAU Note (Signed)
Had D&C here on Friday, was feeling back this a.m., took her BP & states it was over 200.  Pt states she has been bleeding ever since the [facility], has changed one pad this a.m.  Denies pain.

## 2014-11-07 NOTE — MAU Provider Note (Signed)
History     CSN: [id number]  Arrival date and time: 11/07/14 1113   First Provider Initiated Contact with Patient 11/07/14 1255      Chief Complaint  Patient presents with  . Hypertension   HPI   Melissa Valenzuela is a 81 y.o. female No obstetric history on file. Who presents with hypertension  She contacted her PCP regarding her BP and he recommended she call her OB Dr. Due to the patients recent D&C she had done here by Dr. Melba Coon.  She has a history of hypertension and at one time was on BP medication and stopped because she did not like the way it made her feel.  She does not like the idea of taking medication for her BP.    She has seen cardiology in the past and all tests came back normal.   She attests to a small amount of bleeding and no pain.   OB History    No data available      Past Medical History  Diagnosis Date  . Chest pain 02/16/2010    Myoview, negative for pharmacologic-stress induced ischemia, EF 74  . Palpitation 02/02/13    A CardioNet MCOT (mobile cardiac outpatient telemetry ): NSR with PACs; sinus tachycardia with ventricular trigeminy. Brief atrial run. Facet cardiac; ventricular trigeminy and accelerated idioventricular rhythm. No significant tachyarrhythmias.  . Varicose veins of both legs with pain   . Hypertension     no meds currently  . Complication of anesthesia 1954    hard to wake up  . Dysrhythmia     h/o PVCs- none recently- no meds  . Postmenopausal bleeding 11/01/2014    Past Surgical History  Procedure Laterality Date  . Transthoracic echocardiogram  May 2014    Normal LV size and function. EF 55-60%. Grade 1 diastolic dysfunction. Elevated LV filling pressures. No regional W. May.  . Lower extremity venous dopplers  May 2014    No thrombus or thrombophlebitis. R Greater Saphenous Vein - enlarged without other insufficiency (6 mm); right and left Small Saphenous Vein: Patent no valve insufficiency.  . Cesarean section  1962,  1963, [id number]    x 4  . Tonsillectomy    . Diagnostic laparoscopy    . Appendectomy    . Fracture surgery    . Cataract extraction      both eyes  . Hysteroscopy w/d&c N/A 11/02/2014    Procedure: DILATATION AND CURETTAGE /HYSTEROSCOPY;  Surgeon: Melissa Contes, MD;  Location: [facility];  Service: Gynecology;  Laterality: N/A;    Family History  Problem Relation Age of Onset  . Stroke Mother 64  . Hypertension Mother   . Colon cancer Mother 82  . Hypertension Father   . Stroke Father     2  . Hypertension Son 70    Started on BP medicine age [age]    History  Substance Use Topics  . Smoking status: Never Smoker   . Smokeless tobacco: Never Used  . Alcohol Use: 3.5 oz/week    7 Standard drinks or equivalent per week     Comment: occasionally    Allergies: No Known Allergies  Prescriptions prior to admission  Medication Sig Dispense Refill Last Dose  . Multiple Vitamins-Minerals (WOMENS MULTIVITAMIN PLUS PO) Take 1 tablet by mouth daily.   11/06/2014 at Unknown time  . ibuprofen (ADVIL,MOTRIN) 600 MG tablet Take 1 tablet (600 mg total) by mouth every 6 (six) hours as needed. (Patient not taking: Reported on 11/07/2014) 30  tablet 1   . oxyCODONE-acetaminophen (ROXICET) 5-325 MG per tablet Take 1-2 tablets by mouth every 6 (six) hours as needed for severe pain. (Patient not taking: Reported on 11/07/2014) 15 tablet 0    No results found for this or any previous visit (from the past 48 hour(s)).   Review of Systems  Constitutional: Negative for fever and chills.  Eyes: Negative for blurred vision and double vision.  Cardiovascular: Negative for chest pain and palpitations.  Gastrointestinal: Negative for abdominal pain.  Genitourinary:       Small amount of vaginal bleeding   Neurological: Negative for headaches.   Physical Exam   Blood pressure 183/75, pulse 69, temperature 97.7 F (36.5 C), temperature source Oral, resp. rate 20.  Physical Exam   Constitutional: She is oriented to person, place, and time. She appears well-developed and well-nourished.  Non-toxic appearance. She does not have a sickly appearance. She does not appear ill. No distress.  HENT:  Head: Normocephalic.  Eyes: Pupils are equal, round, and reactive to light.  Neck: Neck supple.  Cardiovascular: Normal rate and normal heart sounds.   Respiratory: Effort normal and breath sounds normal. No respiratory distress.  Musculoskeletal: Normal range of motion.  Neurological: She is alert and oriented to person, place, and time. She is not disoriented. GCS eye subscore is 4. GCS verbal subscore is 5. GCS motor subscore is 6.  Skin: Skin is warm. She is not diaphoretic.  Psychiatric: Her behavior is normal.    MAU Course  Procedures  None  MDM Dr. Melba Coon at the beside to evaluate patient. I called patients PCP; Dr. Philip Aspen and scheduled the patient for 3:30 today to discuss BP.   Assessment and Plan   A:  Hypertension; untreated.   P:  Discharge home in stable condition Follow up with PCP today at 3:30 Stroke/MI precautions. Go to [facility] with any of these symptoms Keep scheduled appointment with Dr. Melba Coon.    Melissa Hillock Rasch, NP 11/07/2014 2:19 PM

## 2014-11-07 NOTE — Discharge Instructions (Signed)
How to Take Your Blood Pressure HOW DO I GET A BLOOD PRESSURE MACHINE?  You can buy an electronic home blood pressure machine at your local pharmacy. Insurance will sometimes cover the cost if you have a prescription.  Ask your doctor what type of machine is best for you. There are different machines for your arm and your wrist.  If you decide to buy a machine to check your blood pressure on your arm, first check the size of your arm so you can buy the right size cuff. To check the size of your arm:   Use a measuring tape that shows both inches and centimeters.   Wrap the measuring tape around the upper-middle part of your arm. You may need someone to help you measure.   Write down your arm measurement in both inches and centimeters.   To measure your blood pressure correctly, it is important to have the right size cuff.   If your arm is up to 13 inches (up to 34 centimeters), get an adult cuff size.  If your arm is 13 to 17 inches (35 to 44 centimeters), get a large adult cuff size.    If your arm is 17 to 20 inches (45 to 52 centimeters), get an adult thigh cuff.  WHAT DO THE NUMBERS MEAN?   There are two numbers that make up your blood pressure. For example: 120/80.  The first number (120 in our example) is called the "systolic pressure." It is a measure of the pressure in your blood vessels when your heart is pumping blood.  The second number (80 in our example) is called the "diastolic pressure." It is a measure of the pressure in your blood vessels when your heart is resting between beats.  Your doctor will tell you what your blood pressure should be. WHAT SHOULD I DO BEFORE I CHECK MY BLOOD PRESSURE?   Try to rest or relax for at least 30 minutes before you check your blood pressure.  Do not smoke.  Do not have any drinks with caffeine, such as:  Soda.  Coffee.  Tea.  Check your blood pressure in a quiet room.  Sit down and stretch out your arm on a table.  Keep your arm at about the level of your heart. Let your arm relax.  Make sure that your legs are not crossed. HOW DO I CHECK MY BLOOD PRESSURE?  Follow the directions that came with your machine.  Make sure you remove any tight-fighting clothing from your arm or wrist. Wrap the cuff around your upper arm or wrist. You should be able to fit a finger between the cuff and your arm. If you cannot fit a finger between the cuff and your arm, it is too tight and should be removed and rewrapped.  Some units require you to manually pump up the arm cuff.  Automatic units inflate the cuff when you press a button.  Cuff deflation is automatic in both models.  After the cuff is inflated, the unit measures your blood pressure and pulse. The readings are shown on a monitor. Hold still and breathe normally while the cuff is inflated.  Getting a reading takes less than a minute.  Some models store readings in a memory. Some provide a printout of readings. If your machine does not store your readings, keep a written record.  Take readings with you to your next visit with your doctor. Document Released: 08/13/2008 Document Revised: 01/15/2014 Document Reviewed: 10/26/2013 [employer] Patient Information  2015 [employer]. This information is not intended to replace advice given to you by your health care provider. Make sure you discuss any questions you have with your health care provider.  Hypertension Hypertension, commonly called high blood pressure, is when the force of blood pumping through your arteries is too strong. Your arteries are the blood vessels that carry blood from your heart throughout your body. A blood pressure reading consists of a higher number over a lower number, such as 110/72. The higher number (systolic) is the pressure inside your arteries when your heart pumps. The lower number (diastolic) is the pressure inside your arteries when your heart relaxes. Ideally you want your blood  pressure below 120/80. Hypertension forces your heart to work harder to pump blood. Your arteries may become narrow or stiff. Having hypertension puts you at risk for heart disease, stroke, and other problems.  RISK FACTORS Some risk factors for high blood pressure are controllable. Others are not.  Risk factors you cannot control include:   Race. You may be at higher risk if you are African American.  Age. Risk increases with age.  Gender. Men are at higher risk than women before age 20 years. After age 35, women are at higher risk than men. Risk factors you can control include:  Not getting enough exercise or physical activity.  Being overweight.  Getting too much fat, sugar, calories, or salt in your diet.  Drinking too much alcohol. SIGNS AND SYMPTOMS Hypertension does not usually cause signs or symptoms. Extremely high blood pressure (hypertensive crisis) may cause headache, anxiety, shortness of breath, and nosebleed. DIAGNOSIS  To check if you have hypertension, your health care provider will measure your blood pressure while you are seated, with your arm held at the level of your heart. It should be measured at least twice using the same arm. Certain conditions can cause a difference in blood pressure between your right and left arms. A blood pressure reading that is higher than normal on one occasion does not mean that you need treatment. If one blood pressure reading is high, ask your health care provider about having it checked again. TREATMENT  Treating high blood pressure includes making lifestyle changes and possibly taking medicine. Living a healthy lifestyle can help lower high blood pressure. You may need to change some of your habits. Lifestyle changes may include:  Following the DASH diet. This diet is high in fruits, vegetables, and whole grains. It is low in salt, red meat, and added sugars.  Getting at least 2 hours of brisk physical activity every week.  Losing  weight if necessary.  Not smoking.  Limiting alcoholic beverages.  Learning ways to reduce stress. If lifestyle changes are not enough to get your blood pressure under control, your health care provider may prescribe medicine. You may need to take more than one. Work closely with your health care provider to understand the risks and benefits. HOME CARE INSTRUCTIONS  Have your blood pressure rechecked as directed by your health care provider.   Take medicines only as directed by your health care provider. Follow the directions carefully. Blood pressure medicines must be taken as prescribed. The medicine does not work as well when you skip doses. Skipping doses also puts you at risk for problems.   Do not smoke.   Monitor your blood pressure at home as directed by your health care provider. SEEK MEDICAL CARE IF:   You think you are having a reaction to medicines taken.  You have recurrent headaches or feel dizzy.  You have swelling in your ankles.  You have trouble with your vision. SEEK IMMEDIATE MEDICAL CARE IF:  You develop a severe headache or confusion.  You have unusual weakness, numbness, or feel faint.  You have severe chest or abdominal pain.  You vomit repeatedly.  You have trouble breathing. MAKE SURE YOU:   Understand these instructions.  Will watch your condition.  Will get help right away if you are not doing well or get worse. Document Released: 08/31/2005 Document Revised: 01/15/2014 Document Reviewed: 06/23/2013 [facility] Patient Information 2015 [place], [place]. This information is not intended to replace advice given to you by your health care provider. Make sure you discuss any questions you have with your health care provider.

## 2014-11-09 DIAGNOSIS — M25531 Pain in right wrist: Secondary | ICD-10-CM | POA: Diagnosis not present

## 2014-11-22 DIAGNOSIS — S6991XD Unspecified injury of right wrist, hand and finger(s), subsequent encounter: Secondary | ICD-10-CM | POA: Diagnosis not present

## 2014-11-22 DIAGNOSIS — S63501D Unspecified sprain of right wrist, subsequent encounter: Secondary | ICD-10-CM | POA: Diagnosis not present

## 2015-02-05 DIAGNOSIS — Z1231 Encounter for screening mammogram for malignant neoplasm of breast: Secondary | ICD-10-CM | POA: Diagnosis not present

## 2015-02-14 DIAGNOSIS — H3531 Nonexudative age-related macular degeneration: Secondary | ICD-10-CM | POA: Diagnosis not present

## 2015-02-14 DIAGNOSIS — Z961 Presence of intraocular lens: Secondary | ICD-10-CM | POA: Diagnosis not present

## 2015-03-19 DIAGNOSIS — N390 Urinary tract infection, site not specified: Secondary | ICD-10-CM | POA: Diagnosis not present

## 2015-03-19 DIAGNOSIS — R8299 Other abnormal findings in urine: Secondary | ICD-10-CM | POA: Diagnosis not present

## 2015-03-19 DIAGNOSIS — R300 Dysuria: Secondary | ICD-10-CM | POA: Diagnosis not present

## 2015-06-08 DIAGNOSIS — Z23 Encounter for immunization: Secondary | ICD-10-CM | POA: Diagnosis not present

## 2015-07-03 DIAGNOSIS — H1032 Unspecified acute conjunctivitis, left eye: Secondary | ICD-10-CM | POA: Diagnosis not present

## 2015-07-05 DIAGNOSIS — H1032 Unspecified acute conjunctivitis, left eye: Secondary | ICD-10-CM | POA: Diagnosis not present

## 2015-07-09 DIAGNOSIS — H1031 Unspecified acute conjunctivitis, right eye: Secondary | ICD-10-CM | POA: Diagnosis not present

## 2015-07-29 DIAGNOSIS — M810 Age-related osteoporosis without current pathological fracture: Secondary | ICD-10-CM | POA: Diagnosis not present

## 2015-07-29 DIAGNOSIS — E785 Hyperlipidemia, unspecified: Secondary | ICD-10-CM | POA: Diagnosis not present

## 2015-07-29 DIAGNOSIS — I10 Essential (primary) hypertension: Secondary | ICD-10-CM | POA: Diagnosis not present

## 2015-08-05 DIAGNOSIS — Z6831 Body mass index (BMI) 31.0-31.9, adult: Secondary | ICD-10-CM | POA: Diagnosis not present

## 2015-08-05 DIAGNOSIS — M25571 Pain in right ankle and joints of right foot: Secondary | ICD-10-CM | POA: Diagnosis not present

## 2015-08-05 DIAGNOSIS — Z0000 Encounter for general adult medical examination without abnormal findings: Secondary | ICD-10-CM | POA: Diagnosis not present

## 2015-08-05 DIAGNOSIS — Z1389 Encounter for screening for other disorder: Secondary | ICD-10-CM | POA: Diagnosis not present

## 2015-08-05 DIAGNOSIS — I10 Essential (primary) hypertension: Secondary | ICD-10-CM | POA: Diagnosis not present

## 2015-09-25 DIAGNOSIS — H43812 Vitreous degeneration, left eye: Secondary | ICD-10-CM | POA: Diagnosis not present

## 2015-09-25 DIAGNOSIS — H35352 Cystoid macular degeneration, left eye: Secondary | ICD-10-CM | POA: Diagnosis not present

## 2015-09-25 DIAGNOSIS — H33102 Unspecified retinoschisis, left eye: Secondary | ICD-10-CM | POA: Diagnosis not present

## 2015-09-25 DIAGNOSIS — H35372 Puckering of macula, left eye: Secondary | ICD-10-CM | POA: Diagnosis not present

## 2015-10-28 DIAGNOSIS — H10502 Unspecified blepharoconjunctivitis, left eye: Secondary | ICD-10-CM | POA: Diagnosis not present

## 2016-02-19 DIAGNOSIS — Z803 Family history of malignant neoplasm of breast: Secondary | ICD-10-CM | POA: Diagnosis not present

## 2016-02-19 DIAGNOSIS — Z1231 Encounter for screening mammogram for malignant neoplasm of breast: Secondary | ICD-10-CM | POA: Diagnosis not present

## 2016-04-30 DIAGNOSIS — H2513 Age-related nuclear cataract, bilateral: Secondary | ICD-10-CM | POA: Diagnosis not present

## 2016-04-30 DIAGNOSIS — E119 Type 2 diabetes mellitus without complications: Secondary | ICD-10-CM | POA: Diagnosis not present

## 2016-05-26 DIAGNOSIS — S0990XA Unspecified injury of head, initial encounter: Secondary | ICD-10-CM | POA: Diagnosis not present

## 2016-05-26 DIAGNOSIS — W01198A Fall on same level from slipping, tripping and stumbling with subsequent striking against other object, initial encounter: Secondary | ICD-10-CM | POA: Diagnosis not present

## 2016-05-26 DIAGNOSIS — Z743 Need for continuous supervision: Secondary | ICD-10-CM | POA: Diagnosis not present

## 2016-05-26 DIAGNOSIS — Z23 Encounter for immunization: Secondary | ICD-10-CM | POA: Diagnosis not present

## 2016-05-26 DIAGNOSIS — S0101XA Laceration without foreign body of scalp, initial encounter: Secondary | ICD-10-CM | POA: Diagnosis not present

## 2016-06-05 DIAGNOSIS — Z4802 Encounter for removal of sutures: Secondary | ICD-10-CM | POA: Diagnosis not present

## 2016-06-05 DIAGNOSIS — W19XXXA Unspecified fall, initial encounter: Secondary | ICD-10-CM | POA: Diagnosis not present

## 2016-06-05 DIAGNOSIS — Z87820 Personal history of traumatic brain injury: Secondary | ICD-10-CM | POA: Diagnosis not present

## 2016-06-05 DIAGNOSIS — R5383 Other fatigue: Secondary | ICD-10-CM | POA: Diagnosis not present

## 2016-06-05 DIAGNOSIS — S0191XA Laceration without foreign body of unspecified part of head, initial encounter: Secondary | ICD-10-CM | POA: Diagnosis not present

## 2016-06-08 DIAGNOSIS — Z23 Encounter for immunization: Secondary | ICD-10-CM | POA: Diagnosis not present

## 2016-06-08 DIAGNOSIS — R4020 Unspecified coma: Secondary | ICD-10-CM | POA: Diagnosis not present

## 2016-06-08 DIAGNOSIS — R002 Palpitations: Secondary | ICD-10-CM | POA: Diagnosis not present

## 2016-06-08 DIAGNOSIS — Z6831 Body mass index (BMI) 31.0-31.9, adult: Secondary | ICD-10-CM | POA: Diagnosis not present

## 2016-06-09 ENCOUNTER — Telehealth: Payer: Self-pay | Admitting: Cardiology

## 2016-06-09 NOTE — Telephone Encounter (Signed)
Records received from [facility] for apt on 07/06/16 with Dr Ellyn Hack. Records given to [employer] (medical records) CN

## 2016-07-05 ENCOUNTER — Emergency Department (HOSPITAL_COMMUNITY)
Admission: EM | Admit: 2016-07-05 | Discharge: 2016-07-05 | Disposition: A | Discharge status: Home / Self Care | Payer: Medicare Other | Attending: Emergency Medicine | Admitting: Emergency Medicine

## 2016-07-05 ENCOUNTER — Encounter (HOSPITAL_COMMUNITY): Payer: Self-pay

## 2016-07-05 ENCOUNTER — Emergency Department (HOSPITAL_COMMUNITY): Payer: Medicare Other

## 2016-07-05 DIAGNOSIS — Z7982 Long term (current) use of aspirin: Secondary | ICD-10-CM | POA: Diagnosis not present

## 2016-07-05 DIAGNOSIS — Z5181 Encounter for therapeutic drug level monitoring: Secondary | ICD-10-CM | POA: Diagnosis not present

## 2016-07-05 DIAGNOSIS — R002 Palpitations: Secondary | ICD-10-CM | POA: Diagnosis present

## 2016-07-05 DIAGNOSIS — I10 Essential (primary) hypertension: Secondary | ICD-10-CM | POA: Diagnosis not present

## 2016-07-05 DIAGNOSIS — I493 Ventricular premature depolarization: Secondary | ICD-10-CM

## 2016-07-05 DIAGNOSIS — I5030 Unspecified diastolic (congestive) heart failure: Secondary | ICD-10-CM | POA: Insufficient documentation

## 2016-07-05 DIAGNOSIS — R000 Tachycardia, unspecified: Secondary | ICD-10-CM | POA: Diagnosis not present

## 2016-07-05 LAB — COMPREHENSIVE METABOLIC PANEL
ALT: 19 U/L (ref 14–54)
AST: 21 U/L (ref 15–41)
Albumin: 3.9 g/dL (ref 3.5–5.0)
Alkaline Phosphatase: 83 U/L (ref 38–126)
Anion gap: 11 (ref 5–15)
BUN: 13 mg/dL (ref 6–20)
CO2: 19 mmol/L — ABNORMAL LOW (ref 22–32)
Calcium: 9.8 mg/dL (ref 8.9–10.3)
Chloride: 108 mmol/L (ref 101–111)
Creatinine, Ser: 0.88 mg/dL (ref 0.44–1.00)
GFR calc Af Amer: >60 mL/min (ref >60)
GFR calc non Af Amer: 59 mL/min — ABNORMAL LOW (ref >60)
Glucose, Bld: 107 mg/dL — ABNORMAL HIGH (ref 65–99)
Potassium: 3.9 mmol/L (ref 3.5–5.1)
Sodium: 138 mmol/L (ref 135–145)
Total Bilirubin: 0.5 mg/dL (ref 0.3–1.2)
Total Protein: 6.9 g/dL (ref 6.5–8.1)

## 2016-07-05 LAB — CBC
HCT: 44.3 % (ref 36.0–46.0)
Hemoglobin: 15.2 g/dL — ABNORMAL HIGH (ref 12.0–15.0)
MCH: 30.5 pg (ref 26.0–34.0)
MCHC: 34.3 g/dL (ref 30.0–36.0)
MCV: 88.8 fL (ref 78.0–100.0)
Platelets: 243 10*3/uL (ref 150–400)
RBC: 4.99 MIL/uL (ref 3.87–5.11)
RDW: 14.6 % (ref 11.5–15.5)
WBC: 6 10*3/uL (ref 4.0–10.5)

## 2016-07-05 LAB — URINALYSIS, ROUTINE W REFLEX MICROSCOPIC
Bilirubin Urine: NEGATIVE
Glucose, UA: NEGATIVE mg/dL
Hgb urine dipstick: NEGATIVE
Ketones, ur: NEGATIVE mg/dL
Leukocytes, UA: NEGATIVE
Nitrite: NEGATIVE
Protein, ur: NEGATIVE mg/dL
Specific Gravity, Urine: 1.003 — ABNORMAL LOW (ref 1.005–1.030)
pH: 7.5 (ref 5.0–8.0)

## 2016-07-05 LAB — PROTIME-INR
INR: 0.97
Prothrombin Time: 12.9 seconds (ref 11.4–15.2)

## 2016-07-05 LAB — MAGNESIUM: Magnesium: 2.1 mg/dL (ref 1.7–2.4)

## 2016-07-05 LAB — BRAIN NATRIURETIC PEPTIDE: B Natriuretic Peptide: 77.4 pg/mL (ref 0.0–100.0)

## 2016-07-05 LAB — TROPONIN I: Troponin I: <0.03 ng/mL (ref <0.03)

## 2016-07-05 NOTE — ED Triage Notes (Signed)
Patient complains of palpitations since early am, no chest pain but complains of weakness and dizziness with same

## 2016-07-05 NOTE — ED Notes (Signed)
Patient transported to X-ray 

## 2016-07-05 NOTE — ED Notes (Signed)
EDP at bedside  

## 2016-07-05 NOTE — ED Notes (Signed)
MD at bedside. 

## 2016-07-05 NOTE — ED Notes (Signed)
Pt. Ambulatory with one assist to restroom at this time.
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
## 2016-07-06 ENCOUNTER — Ambulatory Visit (INDEPENDENT_AMBULATORY_CARE_PROVIDER_SITE_OTHER): Payer: Medicare Other | Admitting: Cardiology

## 2016-07-06 ENCOUNTER — Encounter: Payer: Self-pay | Admitting: Cardiology

## 2016-07-06 VITALS — BP 145/90 | HR 81 | Ht 65.0 in | Wt 181.6 lb

## 2016-07-06 DIAGNOSIS — R42 Dizziness and giddiness: Secondary | ICD-10-CM | POA: Diagnosis not present

## 2016-07-06 DIAGNOSIS — I872 Venous insufficiency (chronic) (peripheral): Secondary | ICD-10-CM

## 2016-07-06 DIAGNOSIS — R002 Palpitations: Secondary | ICD-10-CM

## 2016-07-06 DIAGNOSIS — R55 Syncope and collapse: Secondary | ICD-10-CM | POA: Diagnosis not present

## 2016-07-06 DIAGNOSIS — I10 Essential (primary) hypertension: Secondary | ICD-10-CM

## 2016-07-06 NOTE — Progress Notes (Signed)
PCP: Donnajean Lopes, MD  Clinic Note: Chief Complaint  Patient presents with  . Hospitalization Follow-up    Near syncope with fall    HPI: Melissa Valenzuela is a 82 y.o. female with a PMH below who presents today for hospital emergency room follow-up.  Melissa Valenzuela was last seen in December 2014. At that time are significant for palpitations and varicose veins. After initially declining invasive procedures, she at that time agreed to be referred to Dr. Donnetta Hutching from [facility] - did not go.   Recent Hospitalizations: [facility] for palpitations.  Studies Reviewed: None   Interval History: Melissa Valenzuela presents here today in follow-up from a recent ER visit back in [place] but also here Melissa Valenzuela.  Apparently had a syncopal episode while driving to [place] this Sept (05/26/16). She has not had any recent near syncope . She was not treated with beta blockers due to drops in blood pressure. Was seen in the ER while staying in a motel in [place]. Apparently, the patient had not eaten a very big dinner, following a long trip from [place] to [place]. She woke up the following morning and was started get her breakfast ready. She was walking back to the table to tell her husband that she did want a waffle and felt like "she was going to fall "she then felt herself falling and realize she was hit her head on the corner of the table. She was not able to adjust herself, but never indicates that she lost consciousness.  She denied having any sensation of rapid irregular heartbeats or palpitations, no dyspnea, no necessarily vertigo type symptoms.  She felt that she was wobbling, not the room moving. She lost a little bit of her balance, but does not feel like she actually passed out.  She had an episode this past weekend where she felt like her heart was beating fast and she was concerned that she may have another pass out spell. She also felt like her blood pressure was quite  high. On evaluation her blood pressure 160/84. Again she denied any chest tightness pressure or dyspnea.  Other than these 2 episodes, her cardiovascular review of symptoms as follows:  No chest pain or shortness of breath with rest or exertion.   No PND, orthopnea or edema.   No palpitations, lightheadedness, dizziness, weakness or syncope/near syncope.  No TIA/amaurosis fugax symptoms.  No melena, hematochezia, hematuria, or epstaxis.  No claudication.  ROS: A comprehensive was performed. Review of Systems  Constitutional: Negative for weight loss.  HENT: Negative for congestion and nosebleeds.   Respiratory: Negative for cough, shortness of breath and wheezing.   Cardiovascular: Positive for palpitations. Negative for leg swelling.       Otherwise negative per history of present illness  Gastrointestinal: Negative for blood in stool, constipation and melena.  Genitourinary: Negative for dysuria and hematuria.  Musculoskeletal: Negative for falls, joint pain and myalgias.  Skin: Negative.   Neurological: Positive for dizziness (In addition to the symptoms above, she has noted some orthostatic dizziness.). Negative for headaches.       Notes loss of consciousness  Psychiatric/Behavioral: Negative for depression and memory loss. The patient is not nervous/anxious (A little bit of anxiousness about this episode) and does not have insomnia.     Past Medical History:  Diagnosis Date  . Cataract   . Chest pain 02/16/2010   Myoview, negative for pharmacologic-stress induced ischemia, EF 74  . Complication of anesthesia 1954  hard to wake up  . Diverticulosis   . DUB (dysfunctional uterine bleeding)   . Dysrhythmia    h/o PVCs- none recently- no meds  . GERD (gastroesophageal reflux disease)   . Hypertension    no meds currently  . Loss of consciousness ([place])   . Palpitation 02/02/13   A CardioNet MCOT (mobile cardiac outpatient telemetry ): NSR with PACs; sinus tachycardia  with ventricular trigeminy. Brief atrial run. Facet cardiac; ventricular trigeminy and accelerated idioventricular rhythm. No significant tachyarrhythmias.  . Palpitations   . Postmenopausal bleeding 11/01/2014  . Varicose veins of both legs with pain     Past Surgical History:  Procedure Laterality Date  . APPENDECTOMY    . CATARACT EXTRACTION     both eyes  . [place], [id number]   x 4  . DIAGNOSTIC LAPAROSCOPY    . FRACTURE SURGERY    . HIP SURGERY Right   . HYSTEROSCOPY W/D&C N/A 11/02/2014   Procedure: DILATATION AND CURETTAGE /HYSTEROSCOPY;  Surgeon: Janyth Contes, MD;  Location: [facility];  Service: Gynecology;  Laterality: N/A;  . Lower Extremity Venous Dopplers  May 2014   No thrombus or thrombophlebitis. R Greater Saphenous Vein - enlarged without other insufficiency (6 mm); right and left Small Saphenous Vein: Patent no valve insufficiency.  . TONSILLECTOMY    . TONSILLECTOMY AND ADENOIDECTOMY    . TRANSTHORACIC ECHOCARDIOGRAM  May 2014   Normal LV size and function. EF 55-60%. Grade 1 diastolic dysfunction. Elevated LV filling pressures. No regional W. May.    Family History  Problem Relation Age of Onset  . Stroke Mother 55  . Hypertension Mother   . Colon cancer Mother 33  . Hypertension Father   . Stroke Father     2  . Hypertension Son 99    Started on BP medicine age [age]    Social History   Social History  . Marital status: Married    Spouse name: N/A  . Number of children: N/A  . Years of education: N/A   Social History Main Topics  . Smoking status: Never Smoker  . Smokeless tobacco: Never Used  . Alcohol use 3.5 oz/week    7 Standard drinks or equivalent per week     Comment: occasionally  . Drug use: No  . Sexual activity: Not Asked   Other Topics Concern  . None   Social History Narrative   She and her husband are relatively active, but have not been going to [employer] as much as they used to.  They have  been traveling -- just returned from [place] where they did quite a bit of walking.      Family History  Problem Relation Age of Onset  . Stroke Mother 109  . Hypertension Mother   . Colon cancer Mother 45  . Hypertension Father   . Stroke Father     2  . Hypertension Son 59    Started on BP medicine age [age]    Wt Readings from Last 3 Encounters:  07/06/16 82.4 kg (181 lb 9.6 oz)  07/05/16 84.8 kg (187 lb)  10/30/14 83 kg (183 lb)    PHYSICAL EXAM BP (!) 145/90   Pulse 81   Ht 5\' 5"  (1.651 m)   Wt 82.4 kg (181 lb 9.6 oz)   BMI 30.22 kg/m  General appearance: alert, cooperative, appears stated age, no distress and mildly obese. Well-nourished well-groomed. HEENT: [place]/AT, EOMI, MMM, anicteric sclera Neck: no  adenopathy, no carotid bruit and no JVD Lungs: clear to auscultation bilaterally, normal percussion bilaterally and non-labored Heart: regular rate and rhythm, S1 qnd S2 normal, no murmur, click, rub or gallop; nondisplaced PMI Abdomen: soft, non-tender; bowel sounds normal; no masses,  no organomegaly; no HJR Extremities: extremities normal, atraumatic, no cyanosis; mild to moderate varicose veins with minimal edema. Pulses: 2+ and symmetric;  Skin: mobility and turgor normal, no edema and no evidence of bleeding or bruising Neurologic: Mental status: Alert, oriented, thought content appropriate; Cranial nerves: normal (II-XII grossly intact)    Adult ECG Report  Rate: 91 ;  Rhythm: normal sinus rhythm; can't rule out anterior MI, age undetermined.  Narrative Interpretation: Stable EKG. Compared to prior EKG, PVC no longer apparent   Other studies Reviewed: Additional studies/ records that were reviewed today include:  Recent Labs:     Chemistry      Component Value Date/Time   NA 138 07/05/2016 1009   K 3.9 07/05/2016 1009   CL 108 07/05/2016 1009   CO2 19 (L) 07/05/2016 1009   BUN 13 07/05/2016 1009   CREATININE 0.88 07/05/2016 1009      Component  Value Date/Time   CALCIUM 9.8 07/05/2016 1009   ALKPHOS 83 07/05/2016 1009   AST 21 07/05/2016 1009   ALT 19 07/05/2016 1009   BILITOT 0.5 07/05/2016 1009      ASSESSMENT / PLAN: Problem List Items Addressed This Visit    Venous insufficiency - painful varicose veins, restless legs, heaviness (Chronic)    She declined going for any invasive procedures. She states that actually been relatively controlled over the last couple years. I do recommend that she continues to wear her compression stockings, especially when she is on her feet for longer time or doing a long distance travel. Otherwise recommend elevating her feet as much as possible.      Postural dizziness with near syncope    The symptoms she described happening when she was in [place] is probably most consistent with orthostatic dizziness, combined with dehydration. She had not had significant dinner the night before, had not been drinking the previous day. She did note that her edema was worse with the trip. I suspect of having venous stasis with venous pooling, her intravascular volume was depleted making her more susceptible to orthostatic dizziness. She didn't actually lose consciousness, she denied any rapid irregular heartbeats to suspected arrhythmia.  Plan: Would be to continue to ensure adequate hydration. The plan will be for her urine to be clear. Next on also for being on her feet for long periods time she should wear her stockings, this should also be a case when she is traveling in the car for long periods. Would also be beneficial for her to be up walking some during a long drive and not be in the car for longer time.      Palpitations (Chronic)    She has a history of PACs and PVCs on EKG. Someone said the medication yesterday. I think she was a little anxious yesterday having just gotten home and having a fall episode before going to [place]. Her blood pressure was also somewhat elevated, but this has not been an  issue for her in the past. Would prefer not to treat with beta blocker as this may serve to potentiate her orthostatic dizziness.      Essential hypertension - labile (Chronic)    She does have some history of high blood pressures as well as low  blood pressures. My inclination however would be to simply treat this expectantly and not actually tried to treat her hypertension. I'm concerned with her orthostatic dizziness, that she may have further falls if we try to put her on antihypertensives.       Other Visit Diagnoses   None.     Current medicines are reviewed at length with the patient today. (+/- concerns) None The following changes have been made:   Patient Instructions  Stay Hydrated  Stand Slowly  Elevate feet as much as you can  Wear support stockings when traveling    Your physician wants you to follow-up in: 1 year with Dr.Kahdijah Errickson You will receive a reminder letter in the mail two months in advance. If you don't receive a letter, please call our office to schedule the follow-up appointment.     Studies Ordered:   No orders of the defined types were placed in this encounter.    Glenetta Hew, M.D., M.S. Interventional Cardiologist   Pager # [phone] Phone # [phone] [address] [place], [place] [address]

## 2016-07-06 NOTE — Patient Instructions (Signed)
Stay Hydrated  Stand Slowly  Elevate feet as much as you can  Wear support stockings when traveling    Your physician wants you to follow-up in: 1 year with Dr.Harding You will receive a reminder letter in the mail two months in advance. If you don't receive a letter, please call our office to schedule the follow-up appointment.

## 2016-07-07 ENCOUNTER — Encounter: Payer: Self-pay | Admitting: Cardiology

## 2016-07-07 DIAGNOSIS — R55 Syncope and collapse: Principal | ICD-10-CM

## 2016-07-07 DIAGNOSIS — R42 Dizziness and giddiness: Secondary | ICD-10-CM | POA: Insufficient documentation

## 2016-07-07 NOTE — Assessment & Plan Note (Signed)
The symptoms she described happening when she was in [place] is probably most consistent with orthostatic dizziness, combined with dehydration. She had not had significant dinner the night before, had not been drinking the previous day. She did note that her edema was worse with the trip. I suspect of having venous stasis with venous pooling, her intravascular volume was depleted making her more susceptible to orthostatic dizziness. She didn't actually lose consciousness, she denied any rapid irregular heartbeats to suspected arrhythmia.  Plan: Would be to continue to ensure adequate hydration. The plan will be for her urine to be clear. Next on also for being on her feet for long periods time she should wear her stockings, this should also be a case when she is traveling in the car for long periods. Would also be beneficial for her to be up walking some during a long drive and not be in the car for longer time.

## 2016-07-07 NOTE — Assessment & Plan Note (Addendum)
She has a history of PACs and PVCs on EKG. Someone said the medication yesterday. I think she was a little anxious yesterday having just gotten home and having a fall episode before going to [place]. Her blood pressure was also somewhat elevated, but this has not been an issue for her in the past. Would prefer not to treat with beta blocker as this may serve to potentiate her orthostatic dizziness.

## 2016-07-07 NOTE — Assessment & Plan Note (Signed)
She does have some history of high blood pressures as well as low blood pressures. My inclination however would be to simply treat this expectantly and not actually tried to treat her hypertension. I'm concerned with her orthostatic dizziness, that she may have further falls if we try to put her on antihypertensives.

## 2016-07-07 NOTE — Assessment & Plan Note (Signed)
She declined going for any invasive procedures. She states that actually been relatively controlled over the last couple years. I do recommend that she continues to wear her compression stockings, especially when she is on her feet for longer time or doing a long distance travel. Otherwise recommend elevating her feet as much as possible.

## 2016-08-10 DIAGNOSIS — R4020 Unspecified coma: Secondary | ICD-10-CM | POA: Diagnosis not present

## 2016-08-10 DIAGNOSIS — Z6831 Body mass index (BMI) 31.0-31.9, adult: Secondary | ICD-10-CM | POA: Diagnosis not present

## 2016-08-10 DIAGNOSIS — R002 Palpitations: Secondary | ICD-10-CM | POA: Diagnosis not present

## 2016-08-10 DIAGNOSIS — E784 Other hyperlipidemia: Secondary | ICD-10-CM | POA: Diagnosis not present

## 2016-08-10 DIAGNOSIS — Z23 Encounter for immunization: Secondary | ICD-10-CM | POA: Diagnosis not present

## 2016-08-10 DIAGNOSIS — Z0000 Encounter for general adult medical examination without abnormal findings: Secondary | ICD-10-CM | POA: Diagnosis not present

## 2016-08-10 DIAGNOSIS — E785 Hyperlipidemia, unspecified: Secondary | ICD-10-CM | POA: Diagnosis not present

## 2016-08-10 DIAGNOSIS — I10 Essential (primary) hypertension: Secondary | ICD-10-CM | POA: Diagnosis not present

## 2016-08-10 DIAGNOSIS — M810 Age-related osteoporosis without current pathological fracture: Secondary | ICD-10-CM | POA: Diagnosis not present

## 2016-08-14 DIAGNOSIS — Z1389 Encounter for screening for other disorder: Secondary | ICD-10-CM | POA: Diagnosis not present

## 2016-08-14 DIAGNOSIS — Z6830 Body mass index (BMI) 30.0-30.9, adult: Secondary | ICD-10-CM | POA: Diagnosis not present

## 2016-08-14 DIAGNOSIS — E784 Other hyperlipidemia: Secondary | ICD-10-CM | POA: Diagnosis not present

## 2016-08-14 DIAGNOSIS — M810 Age-related osteoporosis without current pathological fracture: Secondary | ICD-10-CM | POA: Diagnosis not present

## 2016-08-14 DIAGNOSIS — M545 Low back pain: Secondary | ICD-10-CM | POA: Diagnosis not present

## 2016-08-14 DIAGNOSIS — Z0000 Encounter for general adult medical examination without abnormal findings: Secondary | ICD-10-CM | POA: Diagnosis not present

## 2016-08-14 DIAGNOSIS — R42 Dizziness and giddiness: Secondary | ICD-10-CM | POA: Diagnosis not present

## 2016-08-14 DIAGNOSIS — F418 Other specified anxiety disorders: Secondary | ICD-10-CM | POA: Diagnosis not present

## 2016-08-14 DIAGNOSIS — I10 Essential (primary) hypertension: Secondary | ICD-10-CM | POA: Diagnosis not present

## 2016-08-17 DIAGNOSIS — L738 Other specified follicular disorders: Secondary | ICD-10-CM | POA: Diagnosis not present

## 2016-08-17 DIAGNOSIS — L814 Other melanin hyperpigmentation: Secondary | ICD-10-CM | POA: Diagnosis not present

## 2016-08-17 DIAGNOSIS — D485 Neoplasm of uncertain behavior of skin: Secondary | ICD-10-CM | POA: Diagnosis not present

## 2016-08-17 DIAGNOSIS — L821 Other seborrheic keratosis: Secondary | ICD-10-CM | POA: Diagnosis not present

## 2016-08-18 DIAGNOSIS — H109 Unspecified conjunctivitis: Secondary | ICD-10-CM | POA: Diagnosis not present

## 2016-10-12 DIAGNOSIS — I10 Essential (primary) hypertension: Secondary | ICD-10-CM | POA: Diagnosis not present

## 2016-10-12 DIAGNOSIS — B0229 Other postherpetic nervous system involvement: Secondary | ICD-10-CM | POA: Diagnosis not present

## 2016-10-22 DIAGNOSIS — B0229 Other postherpetic nervous system involvement: Secondary | ICD-10-CM | POA: Diagnosis not present

## 2016-10-22 DIAGNOSIS — I10 Essential (primary) hypertension: Secondary | ICD-10-CM | POA: Diagnosis not present

## 2016-10-22 DIAGNOSIS — Z6830 Body mass index (BMI) 30.0-30.9, adult: Secondary | ICD-10-CM | POA: Diagnosis not present

## 2016-11-12 DIAGNOSIS — F419 Anxiety disorder, unspecified: Secondary | ICD-10-CM | POA: Diagnosis not present

## 2016-11-12 DIAGNOSIS — Z6830 Body mass index (BMI) 30.0-30.9, adult: Secondary | ICD-10-CM | POA: Diagnosis not present

## 2016-11-12 DIAGNOSIS — B0229 Other postherpetic nervous system involvement: Secondary | ICD-10-CM | POA: Diagnosis not present

## 2016-11-26 ENCOUNTER — Ambulatory Visit (INDEPENDENT_AMBULATORY_CARE_PROVIDER_SITE_OTHER): Payer: Medicare Other | Admitting: Neurology

## 2016-11-26 ENCOUNTER — Encounter: Payer: Self-pay | Admitting: Neurology

## 2016-11-26 VITALS — BP 126/78 | HR 85 | Temp 97.8°F | Resp 16 | Ht 64.5 in | Wt 181.0 lb

## 2016-11-26 DIAGNOSIS — B0229 Other postherpetic nervous system involvement: Secondary | ICD-10-CM | POA: Diagnosis not present

## 2016-11-26 MED ORDER — LIDOCAINE 5 % EX OINT: 1.0000 "application " | TOPICAL_OINTMENT | Freq: Three times a day (TID) | CUTANEOUS | 0 refills | Status: DC | PRN

## 2016-11-26 MED ORDER — AMITRIPTYLINE HCL 10 MG PO TABS: ORAL_TABLET | ORAL | 5 refills | Status: DC

## 2016-11-26 NOTE — Patient Instructions (Signed)
1. Start amitriptyline 10mg : Take 1 tablet at night for 1 week, then increase to 2 tablets at night.  2. Start weaning off gabapentin by 1 capsule a week 3. Apply lidocaine ointment 5% on affected area 3 times a day 4. Follow-up in 3-4 months, call for any problems

## 2016-11-26 NOTE — Progress Notes (Signed)
NEUROLOGY CONSULTATION NOTE  Melissa Valenzuela MRN: [id number] DOB: 05-08-1931  Referring provider: Dr. Leanna Battles Primary care provider: Dr. Leanna Battles  Reason for consult:  Herpetic neuralgia  Dear Dr Philip Aspen:  Thank you for your kind referral of Melissa Valenzuela for consultation of the above symptoms. Although her history is well known to you, please allow me to reiterate it for the purpose of our medical record. The patient was accompanied to the clinic by her husband who also provides collateral information. Records and images were personally reviewed where available.  HISTORY OF PRESENT ILLNESS: This is a pleasant 83 year old right-handed woman with a history of hypertension, GERD, presenting for management of herpetic neuralgia. Her husband reports symptoms started on 10/10/16. She developed a painful rash over the right arm (C6-7 dermatomal distribution), as well as on the palm of her hand. She developed painful blisters on the base of her palm near the thumb. She has allodynia, with light touch or even water causing pain. She reports burning pain, she cannot knit anymore. Anything that touches her hand and arm is painful. Initially pain was close to a 10/10, she reports her pain threshold is high and pain today is 5-6 over 10. Moving her thumb hurts. Symptoms are more bothersome in the evening hours after 3pm. She was treated with antiviral and started on gabapentin up to 600mg  TID. She felt lightheaded and drowsy on it, and does not think that it helps much. Her husband weaned her down to 600mg  in AM, 300mg  at 3pm, 300mg  at night. She is able to sleep well without the pain waking her up at night. The face, right leg, and left side are unaffected. She denies any headaches, diplopia, dysarthria/dysphagia, neck/back pain, bowel/bladder dysfunction. She had a fall last September and hit her head, no loss of consciousness. No prior history of shingles.   PAST MEDICAL  HISTORY: Past Medical History:  Diagnosis Date  . Cataract   . Chest pain 02/16/2010   Myoview, negative for pharmacologic-stress induced ischemia, EF 74  . Complication of anesthesia 1954   hard to wake up  . Diverticulosis   . DUB (dysfunctional uterine bleeding)   . Dysrhythmia    h/o PVCs- none recently- no meds  . GERD (gastroesophageal reflux disease)   . Hypertension    no meds currently  . Loss of consciousness ([place])   . Palpitation 02/02/13   A CardioNet MCOT (mobile cardiac outpatient telemetry ): NSR with PACs; sinus tachycardia with ventricular trigeminy. Brief atrial run. Facet cardiac; ventricular trigeminy and accelerated idioventricular rhythm. No significant tachyarrhythmias.  . Palpitations   . Postmenopausal bleeding 11/01/2014  . Varicose veins of both legs with pain     PAST SURGICAL HISTORY: Past Surgical History:  Procedure Laterality Date  . APPENDECTOMY    . CATARACT EXTRACTION     both eyes  . [place], [id number]   x 4  . DIAGNOSTIC LAPAROSCOPY    . FRACTURE SURGERY    . HIP SURGERY Right   . HYSTEROSCOPY W/D&C N/A 11/02/2014   Procedure: DILATATION AND CURETTAGE /HYSTEROSCOPY;  Surgeon: Janyth Contes, MD;  Location: [facility];  Service: Gynecology;  Laterality: N/A;  . Lower Extremity Venous Dopplers  May 2014   No thrombus or thrombophlebitis. R Greater Saphenous Vein - enlarged without other insufficiency (6 mm); right and left Small Saphenous Vein: Patent no valve insufficiency.  . TONSILLECTOMY    . TONSILLECTOMY AND ADENOIDECTOMY    . TRANSTHORACIC  ECHOCARDIOGRAM  May 2014   Normal LV size and function. EF 55-60%. Grade 1 diastolic dysfunction. Elevated LV filling pressures. No regional W. May.    MEDICATIONS:  Outpatient Encounter Prescriptions as of 11/26/2016  Medication Sig  . aspirin 325 MG tablet Take 325 mg by mouth daily.  . Multiple Vitamins-Minerals (WOMENS MULTIVITAMIN PLUS PO) Take 1 tablet by mouth daily.   Gabapentin 300mg    Takes 2 caps in AM, 1 cap at 3pm, 1 cap qhs No facility-administered encounter medications on file as of 11/26/2016.     ALLERGIES: Allergies  Allergen Reactions  . Moxifloxacin Rash    FAMILY HISTORY: Family History  Problem Relation Age of Onset  . Stroke Mother 25  . Hypertension Mother   . Colon cancer Mother 52  . Hypertension Father   . Stroke Father     2  . Hypertension Son 75    Started on BP medicine age [age]    SOCIAL HISTORY: Social History   Social History  . Marital status: Married    Spouse name: N/A  . Number of children: N/A  . Years of education: N/A   Occupational History  . Not on file.   Social History Main Topics  . Smoking status: Never Smoker  . Smokeless tobacco: Never Used  . Alcohol use 3.5 oz/week    7 Standard drinks or equivalent per week     Comment: occasionally  . Drug use: No  . Sexual activity: Not on file   Other Topics Concern  . Not on file   Social History Narrative   She and her husband are relatively active, but have not been going to [employer] as much as they used to.  They have been traveling -- just returned from [place] where they did quite a bit of walking.      REVIEW OF SYSTEMS: Constitutional: No fevers, chills, or sweats, no generalized fatigue, change in appetite Eyes: No visual changes, double vision, eye pain Ear, nose and throat: No hearing loss, ear pain, nasal congestion, sore throat Cardiovascular: No chest pain, palpitations Respiratory:  No shortness of breath at rest or with exertion, wheezes GastrointestinaI: No nausea, vomiting, diarrhea, abdominal pain, fecal incontinence Genitourinary:  No dysuria, urinary retention or frequency Musculoskeletal:  No neck pain, back pain Integumentary: No rash, pruritus, skin lesions Neurological: as above Psychiatric: No depression, insomnia, anxiety Endocrine: No palpitations, fatigue, diaphoresis, mood swings, change in  appetite, change in weight, increased thirst Hematologic/Lymphatic:  No anemia, purpura, petechiae. Allergic/Immunologic: no itchy/runny eyes, nasal congestion, recent allergic reactions, rashes  PHYSICAL EXAM: Vitals:   11/26/16 1043  BP: 126/78  Pulse: 85  Resp: 16  Temp: 97.8 F (36.6 C)   General: No acute distress Head:  Normocephalic/atraumatic Eyes: Fundoscopic exam shows bilateral sharp discs, no vessel changes, exudates, or hemorrhages Neck: supple, no paraspinal tenderness, full range of motion Back: No paraspinal tenderness Heart: regular rate and rhythm Lungs: Clear to auscultation bilaterally. Vascular: No carotid bruits. Skin/Extremities: healing shingles rash over the right arm (C6-7 dermatomal distribution) and palm Neurological Exam: Mental status: alert and oriented to person, place, and time, no dysarthria or aphasia, Fund of knowledge is appropriate.  Recent and remote memory are intact.  Attention and concentration are normal.    Able to name objects and repeat phrases. Cranial nerves: CN I: not tested CN II: pupils equal, round and reactive to light, visual fields intact, fundi unremarkable. CN III, IV, VI:  full range of motion, no  nystagmus, no ptosis CN V: facial sensation intact CN VII: upper and lower face symmetric CN VIII: hearing intact to finger rub CN IX, X: gag intact, uvula midline CN XI: sternocleidomastoid and trapezius muscles intact CN XII: tongue midline Bulk & Tone: normal, no fasciculations. Motor: 5/5 throughout with no pronator drift, decreased fine finger movements on right hand due to pain but good strength Sensation: allodynia on the right arm and hand to light touch, intact cold, pin, vibration and joint position sense.  No extinction to double simultaneous stimulation.  Romberg test negative Deep Tendon Reflexes: +2 throughout, no ankle clonus Plantar responses: downgoing bilaterally Cerebellar: no incoordination on finger to  nose, heel to shin. No dysdiadochokinesia Gait: walks with right foot everted and knee slightly bent, no ataxia Tremor: none  IMPRESSION: This is a pleasant 83 year old right-handed woman with a history of hypertension, GERD, presenting with subacute herpetic neuralgia (<4 months duration). We discussed natural history, clinical course, and management of herpetic neuralgia. She tried gabapentin but has not noticed much relief, and has had side effects on higher doses. She is agreeable to try a low dose of amitriptyline, we discussed that all the oral medications used do cause drowsiness, monitor for side effects. She will start 10mg  qhs x 1 week, then increase to 20mg  qhs if needed. We may further uptitrate as tolerated. She will start weaning off the gabapentin by 1 capsule a week. She was also given a prescription for topical lidocaine 5% for her palm. She will follow-up in 4 months and knows to call for any changes.   Thank you for allowing me to participate in the care of this patient. Please do not hesitate to call for any questions or concerns.   Ellouise Newer, M.D.  CC: Dr. Philip Aspen

## 2016-11-27 DIAGNOSIS — B0229 Other postherpetic nervous system involvement: Secondary | ICD-10-CM | POA: Insufficient documentation

## 2016-11-30 DIAGNOSIS — B0229 Other postherpetic nervous system involvement: Secondary | ICD-10-CM | POA: Diagnosis not present

## 2016-11-30 DIAGNOSIS — Z6830 Body mass index (BMI) 30.0-30.9, adult: Secondary | ICD-10-CM | POA: Diagnosis not present

## 2016-11-30 DIAGNOSIS — I10 Essential (primary) hypertension: Secondary | ICD-10-CM | POA: Diagnosis not present

## 2017-01-14 DIAGNOSIS — Z6831 Body mass index (BMI) 31.0-31.9, adult: Secondary | ICD-10-CM | POA: Diagnosis not present

## 2017-01-14 DIAGNOSIS — B0229 Other postherpetic nervous system involvement: Secondary | ICD-10-CM | POA: Diagnosis not present

## 2017-01-14 DIAGNOSIS — R600 Localized edema: Secondary | ICD-10-CM | POA: Diagnosis not present

## 2017-01-14 DIAGNOSIS — M25571 Pain in right ankle and joints of right foot: Secondary | ICD-10-CM | POA: Diagnosis not present

## 2017-01-14 DIAGNOSIS — I10 Essential (primary) hypertension: Secondary | ICD-10-CM | POA: Diagnosis not present

## 2017-01-14 HISTORY — PX: COLONOSCOPY: SHX174

## 2017-01-15 ENCOUNTER — Other Ambulatory Visit: Payer: Self-pay | Admitting: Internal Medicine

## 2017-01-15 DIAGNOSIS — R600 Localized edema: Secondary | ICD-10-CM | POA: Diagnosis not present

## 2017-03-30 ENCOUNTER — Encounter: Payer: Self-pay | Admitting: Neurology

## 2017-03-30 ENCOUNTER — Ambulatory Visit (INDEPENDENT_AMBULATORY_CARE_PROVIDER_SITE_OTHER): Payer: Medicare Other | Admitting: Neurology

## 2017-03-30 VITALS — BP 120/70 | HR 79 | Ht 65.5 in | Wt 186.4 lb

## 2017-03-30 DIAGNOSIS — B0229 Other postherpetic nervous system involvement: Secondary | ICD-10-CM | POA: Diagnosis not present

## 2017-03-30 NOTE — Progress Notes (Signed)
NEUROLOGY FOLLOW UP OFFICE NOTE  Melissa Valenzuela [id number] [birth year]  HISTORY OF PRESENT ILLNESS: I had the pleasure of seeing Melissa Valenzuela in follow-up in the neurology clinic on 03/30/2017.  The patient was last seen 4 months ago for post-herpetic neuralgia. She is again accompanied by her husband today. On her last visit, she continued to report pain with gaabpentin, with drowsiness on higher doses. She was given a prescription for low dose amitriptyline, she took 20mg  qhs for a month, then did not renew the medication. She did not have side effects but felt it was not helping. She is currently down to 300mg  BID gabapentin with no side effects. She reports the pain is much better, 1/10 more over the right thumb region. She uses the lidocaine ointment on her thumb every night and is not sure it helps but uses it anyway. She mostly feels itchy in the affected region, and it mostly does not bother her if she does not pay attention to it.   HPI 11/26/2016: This is a pleasant 83 yo RH woman with a history of hypertension, GERD, with post-herpetic neuralgia. Her husband reports symptoms started on 10/10/16. She developed a painful rash over the right arm (C6-7 dermatomal distribution), as well as on the palm of her hand. She developed painful blisters on the base of her palm near the thumb. She has allodynia, with light touch or even water causing pain. She reports burning pain, she cannot knit anymore. Anything that touches her hand and arm is painful. Initially pain was close to a 10/10, she reports her pain threshold is high and pain today is 5-6 over 10. Moving her thumb hurts. Symptoms are more bothersome in the evening hours after 3pm. She was treated with antiviral and started on gabapentin up to 600mg  TID. She felt lightheaded and drowsy on it, and does not think that it helps much. Her husband weaned her down to 600mg  in AM, 300mg  at 3pm, 300mg  at night. She is able to sleep well without  the pain waking her up at night. The face, right leg, and left side are unaffected. She denies any headaches, diplopia, dysarthria/dysphagia, neck/back pain, bowel/bladder dysfunction. She had a fall last September and hit her head, no loss of consciousness. No prior history of shingles.   PAST MEDICAL HISTORY: Past Medical History:  Diagnosis Date  . Cataract   . Chest pain 02/16/2010   Myoview, negative for pharmacologic-stress induced ischemia, EF 74  . Complication of anesthesia 1954   hard to wake up  . Diverticulosis   . DUB (dysfunctional uterine bleeding)   . Dysrhythmia    h/o PVCs- none recently- no meds  . GERD (gastroesophageal reflux disease)   . Hypertension    no meds currently  . Loss of consciousness ([place])   . Palpitation 02/02/13   A CardioNet MCOT (mobile cardiac outpatient telemetry ): NSR with PACs; sinus tachycardia with ventricular trigeminy. Brief atrial run. Facet cardiac; ventricular trigeminy and accelerated idioventricular rhythm. No significant tachyarrhythmias.  . Palpitations   . Postmenopausal bleeding 11/01/2014  . Varicose veins of both legs with pain     MEDICATIONS: Current Outpatient Prescriptions on File Prior to Visit  Medication Sig Dispense Refill  . amitriptyline (ELAVIL) 10 MG tablet Take 1 tablet at night for 1 week, then increase to 2 tablets at night (Patient not taking) 60 tablet 5  . aspirin 325 MG tablet Take 325 mg by mouth daily.    Marland Kitchen gabapentin (NEURONTIN) 300 MG  capsule Take 300 mg by mouth 3 (three) times daily. Take 2 caps in AM, 1 cap at 3pm, 1 cap at bedtime (Taking 1 cap BID)    . lidocaine (XYLOCAINE) 5 % ointment Apply 1 application topically 3 (three) times daily as needed. 35.44 g 0  . Multiple Vitamins-Minerals (WOMENS MULTIVITAMIN PLUS PO) Take 1 tablet by mouth daily.     No current facility-administered medications on file prior to visit.     ALLERGIES: Allergies  Allergen Reactions  . Moxifloxacin Rash    FAMILY  HISTORY: Family History  Problem Relation Age of Onset  . Stroke Mother 36  . Hypertension Mother   . Colon cancer Mother 84  . Hypertension Father   . Stroke Father        2  . Hypertension Son 29       Started on BP medicine age [age]    SOCIAL HISTORY: Social History   Social History  . Marital status: Married    Spouse name: N/A  . Number of children: N/A  . Years of education: N/A   Occupational History  . Not on file.   Social History Main Topics  . Smoking status: Never Smoker  . Smokeless tobacco: Never Used  . Alcohol use 3.5 oz/week    7 Standard drinks or equivalent per week     Comment: occasionally  . Drug use: No  . Sexual activity: Not on file   Other Topics Concern  . Not on file   Social History Narrative   She and her husband are relatively active, but have not been going to [employer] as much as they used to.  They have been traveling -- just returned from [place] where they did quite a bit of walking.      REVIEW OF SYSTEMS: Constitutional: No fevers, chills, or sweats, no generalized fatigue, change in appetite Eyes: No visual changes, double vision, eye pain Ear, nose and throat: No hearing loss, ear pain, nasal congestion, sore throat Cardiovascular: No chest pain, palpitations Respiratory:  No shortness of breath at rest or with exertion, wheezes GastrointestinaI: No nausea, vomiting, diarrhea, abdominal pain, fecal incontinence Genitourinary:  No dysuria, urinary retention or frequency Musculoskeletal:  No neck pain, back pain Integumentary: No rash, pruritus, skin lesions Neurological: as above Psychiatric: No depression, insomnia, anxiety Endocrine: No palpitations, fatigue, diaphoresis, mood swings, change in appetite, change in weight, increased thirst Hematologic/Lymphatic:  No anemia, purpura, petechiae. Allergic/Immunologic: no itchy/runny eyes, nasal congestion, recent allergic reactions, rashes  PHYSICAL EXAM: Vitals:    03/30/17 1136  BP: 120/70  Pulse: 79   General: No acute distress Head:  Normocephalic/atraumatic Neck: supple, no paraspinal tenderness, full range of motion Back: No paraspinal tenderness Heart: regular rate and rhythm Lungs: Clear to auscultation bilaterally. Vascular: No carotid bruits. Skin/Extremities: healed shingles rash over the right arm (C6-7 dermatomal distribution) and palm but still with some hyperpigmentation Neurological Exam: Mental status: alert and oriented to person, place, and time, no dysarthria or aphasia, Fund of knowledge is appropriate.  Recent and remote memory are intact.  Attention and concentration are normal.    Able to name objects and repeat phrases. Cranial nerves: CN I: not tested CN II: pupils equal, round and reactive to light, visual fields intact, fundi unremarkable. CN III, IV, VI:  full range of motion, no nystagmus, no ptosis CN V: facial sensation intact CN VII: upper and lower face symmetric CN VIII: hearing intact to finger rub CN IX, X: gag  intact, uvula midline CN XI: sternocleidomastoid and trapezius muscles intact CN XII: tongue midline Bulk & Tone: normal, no fasciculations. Motor: 5/5 throughout with no pronator drift Sensation: intact to light touch (previously had allodynia to light touch).  No extinction to double simultaneous stimulation.  Romberg test negative Deep Tendon Reflexes: +2 throughout, no ankle clonus Plantar responses: downgoing bilaterally Cerebellar: no incoordination on finger to nose testing Gait: walks with right foot everted and knee slightly bent, no ataxia Tremor: none  IMPRESSION: This is a pleasant 83 yo RH woman with a history of hypertension, GERD, with post-herpetic neuralgia that started in January 2018. The pain is significantly improved, she only has itching in the arm when she pays attention to it, otherwise arm does not bother her. She still has 1/10 pain mostly in the right thumb region, and  will continue lidocaine ointment. She is agreeable to increasing gabapentin slightly to 300mg  in AM, 600mg  in PM and monitor for any improvement. We may reconsider amitriptyline or nortriptyline in the future if symptoms persist. She will follow-up in 4 months and knows to call for any changes  Thank you for allowing me to participate in her care.  Please do not hesitate to call for any questions or concerns.  The duration of this appointment visit was 15 minutes of face-to-face time with the patient.  Greater than 50% of this time was spent in counseling, explanation of diagnosis, planning of further management, and coordination of care.   Ellouise Newer, M.D.   CC: Dr. Philip Aspen

## 2017-03-30 NOTE — Patient Instructions (Signed)
1. Increase gabapentin 300mg : take 1 capsule in AM, 2 capsules in PM 2. Continue lidocaine ointment on right thumb 3. Follow-up in 4 months, call for any changes

## 2017-05-03 DIAGNOSIS — H353131 Nonexudative age-related macular degeneration, bilateral, early dry stage: Secondary | ICD-10-CM | POA: Diagnosis not present

## 2017-05-03 DIAGNOSIS — Z961 Presence of intraocular lens: Secondary | ICD-10-CM | POA: Diagnosis not present

## 2017-05-24 DIAGNOSIS — Z23 Encounter for immunization: Secondary | ICD-10-CM | POA: Diagnosis not present

## 2017-05-24 DIAGNOSIS — Z1389 Encounter for screening for other disorder: Secondary | ICD-10-CM | POA: Diagnosis not present

## 2017-05-24 DIAGNOSIS — Z6830 Body mass index (BMI) 30.0-30.9, adult: Secondary | ICD-10-CM | POA: Diagnosis not present

## 2017-05-24 DIAGNOSIS — B0229 Other postherpetic nervous system involvement: Secondary | ICD-10-CM | POA: Diagnosis not present

## 2017-05-24 DIAGNOSIS — I10 Essential (primary) hypertension: Secondary | ICD-10-CM | POA: Diagnosis not present

## 2017-07-12 DIAGNOSIS — R05 Cough: Secondary | ICD-10-CM | POA: Diagnosis not present

## 2017-07-12 DIAGNOSIS — Z6830 Body mass index (BMI) 30.0-30.9, adult: Secondary | ICD-10-CM | POA: Diagnosis not present

## 2017-07-20 ENCOUNTER — Ambulatory Visit (INDEPENDENT_AMBULATORY_CARE_PROVIDER_SITE_OTHER): Payer: Medicare Other | Admitting: Neurology

## 2017-07-20 ENCOUNTER — Encounter: Payer: Self-pay | Admitting: Neurology

## 2017-07-20 VITALS — BP 138/70 | HR 67 | Ht 66.0 in | Wt 179.0 lb

## 2017-07-20 DIAGNOSIS — B0229 Other postherpetic nervous system involvement: Secondary | ICD-10-CM

## 2017-07-20 NOTE — Patient Instructions (Signed)
Continue to monitor your symptoms, if they worsen, please call to schedule an appointment. Follow-up on as needed basis.

## 2017-07-20 NOTE — Progress Notes (Signed)
NEUROLOGY FOLLOW UP OFFICE NOTE  Caitrin Pendergraph [id number] [birth year]  HISTORY OF PRESENT ILLNESS: I had the pleasure of seeing Janvi Ammar in follow-up in the neurology clinic on 07/20/2017.  The patient was last seen 4 months ago for post-herpetic neuralgia. She is again accompanied by her husband today. On her last visit, she reported pain was significantly improved, with only itching when she pays attention to it. She mostly had minimal pain in her right thumb region, and was instructed to slightly increase gabapentin. She presents today reporting that she had stopped the gabapentin 2-3 months ago. She is not big on medications. She still has the itching on her right arm, denies any arm pain. She continues to have around 3 to 4 over 10 pain on lateral side of right thumb pad, she reports it "just feels different." She is now able to write with her right hand, pain does not affect any activities. She still uses the lidocaine ointment. She denies any headaches, dizziness, diplopia, no falls.   HPI 11/26/2016: This is a pleasant 83 yo RH woman with a history of hypertension, GERD, with post-herpetic neuralgia. Her husband reports symptoms started on 10/10/16. She developed a painful rash over the right arm (C6-7 dermatomal distribution), as well as on the palm of her hand. She developed painful blisters on the base of her palm near the thumb. She has allodynia, with light touch or even water causing pain. She reports burning pain, she cannot knit anymore. Anything that touches her hand and arm is painful. Initially pain was close to a 10/10, she reports her pain threshold is high and pain today is 5-6 over 10. Moving her thumb hurts. Symptoms are more bothersome in the evening hours after 3pm. She was treated with antiviral and started on gabapentin up to 600mg  TID. She felt lightheaded and drowsy on it, and does not think that it helps much. Her husband weaned her down to 600mg  in AM, 300mg  at  3pm, 300mg  at night. She is able to sleep well without the pain waking her up at night. The face, right leg, and left side are unaffected. She denies any headaches, diplopia, dysarthria/dysphagia, neck/back pain, bowel/bladder dysfunction. She had a fall last September and hit her head, no loss of consciousness. No prior history of shingles.   PAST MEDICAL HISTORY: Past Medical History:  Diagnosis Date  . Cataract   . Chest pain 02/16/2010   Myoview, negative for pharmacologic-stress induced ischemia, EF 74  . Complication of anesthesia 1954   hard to wake up  . Diverticulosis   . DUB (dysfunctional uterine bleeding)   . Dysrhythmia    h/o PVCs- none recently- no meds  . GERD (gastroesophageal reflux disease)   . Hypertension    no meds currently  . Loss of consciousness ([place])   . Palpitation 02/02/13   A CardioNet MCOT (mobile cardiac outpatient telemetry ): NSR with PACs; sinus tachycardia with ventricular trigeminy. Brief atrial run. Facet cardiac; ventricular trigeminy and accelerated idioventricular rhythm. No significant tachyarrhythmias.  . Palpitations   . Postmenopausal bleeding 11/01/2014  . Varicose veins of both legs with pain     MEDICATIONS:  Outpatient Encounter Medications as of 07/20/2017  Medication Sig  . aspirin 325 MG tablet Take 325 mg by mouth daily.  Marland Kitchen gabapentin (NEURONTIN) 300 MG capsule Take 300 mg by mouth 3 (three) times daily. Take 2 caps in AM, 1 cap at 3pm, 1 cap at bedtime (Patient not taking)  . lidocaine (XYLOCAINE)  5 % ointment Apply 1 application topically 3 (three) times daily as needed.  . Multiple Vitamins-Minerals (WOMENS MULTIVITAMIN PLUS PO) Take 1 tablet by mouth daily.   No facility-administered encounter medications on file as of 07/20/2017.      ALLERGIES: Allergies  Allergen Reactions  . Moxifloxacin Rash    FAMILY HISTORY: Family History  Problem Relation Age of Onset  . Stroke Mother 81  . Hypertension Mother   . Colon cancer  Mother 64  . Hypertension Father   . Stroke Father        2  . Hypertension Son 63       Started on BP medicine age [age]    SOCIAL HISTORY: Social History   Socioeconomic History  . Marital status: Married    Spouse name: Not on file  . Number of children: Not on file  . Years of education: Not on file  . Highest education level: Not on file  Social Needs  . Financial resource strain: Not on file  . Food insecurity - worry: Not on file  . Food insecurity - inability: Not on file  . Transportation needs - medical: Not on file  . Transportation needs - non-medical: Not on file  Occupational History  . Not on file  Tobacco Use  . Smoking status: Never Smoker  . Smokeless tobacco: Never Used  Substance and Sexual Activity  . Alcohol use: Yes    Alcohol/week: 3.5 oz    Types: 7 Standard drinks or equivalent per week    Comment: occasionally  . Drug use: No  . Sexual activity: Not on file  Other Topics Concern  . Not on file  Social History Narrative   She and her husband are relatively active, but have not been going to [employer] as much as they used to.  They have been traveling -- just returned from [place] where they did quite a bit of walking.      REVIEW OF SYSTEMS: Constitutional: No fevers, chills, or sweats, no generalized fatigue, change in appetite Eyes: No visual changes, double vision, eye pain Ear, nose and throat: No hearing loss, ear pain, nasal congestion, sore throat Cardiovascular: No chest pain, palpitations Respiratory:  No shortness of breath at rest or with exertion, wheezes GastrointestinaI: No nausea, vomiting, diarrhea, abdominal pain, fecal incontinence Genitourinary:  No dysuria, urinary retention or frequency Musculoskeletal:  No neck pain, back pain Integumentary: No rash, pruritus, skin lesions Neurological: as above Psychiatric: No depression, insomnia, anxiety Endocrine: No palpitations, fatigue, diaphoresis, mood swings,  change in appetite, change in weight, increased thirst Hematologic/Lymphatic:  No anemia, purpura, petechiae. Allergic/Immunologic: no itchy/runny eyes, nasal congestion, recent allergic reactions, rashes  PHYSICAL EXAM: Vitals:   07/20/17 1107  BP: 138/70  Pulse: 67  SpO2: 97%   General: No acute distress Head:  Normocephalic/atraumatic Neck: supple, no paraspinal tenderness, full range of motion Back: No paraspinal tenderness Heart: regular rate and rhythm Lungs: Clear to auscultation bilaterally. Vascular: No carotid bruits. Skin/Extremities: no rash/edema Neurological Exam: Mental status: alert and oriented to person, place, and time, no dysarthria or aphasia, Fund of knowledge is appropriate.  Recent and remote memory are intact.  Attention and concentration are normal.    Able to name objects and repeat phrases. Cranial nerves: CN I: not tested CN II: pupils equal, round and reactive to light, visual fields intact, fundi unremarkable. CN III, IV, VI:  full range of motion, no nystagmus, no ptosis CN V: facial sensation intact CN VII:  upper and lower face symmetric CN VIII: hearing intact to finger rub CN IX, X: gag intact, uvula midline CN XI: sternocleidomastoid and trapezius muscles intact CN XII: tongue midline Bulk & Tone: normal, no fasciculations. Motor: 5/5 throughout with no pronator drift, including FFM Sensation: intact to light touch (previously had allodynia to light touch).  No extinction to double simultaneous stimulation.  Romberg test negative Deep Tendon Reflexes: +2 throughout, no ankle clonus Plantar responses: downgoing bilaterally Cerebellar: no incoordination on finger to nose testing Gait: walks with right foot everted and knee slightly bent, no ataxia (similar to prior) Tremor: none  IMPRESSION: This is a pleasant 83 yo RH woman with a history of hypertension, GERD, with post-herpetic neuralgia that started in January 2018. The pain is  significantly improved, she mostly has some itching on the arm and mild pain over the right thumb. She stopped gabapentin and is not interested in starting symptomatic medication at this time. She uses lidocaine ointment as needed. She will follow-up on a prn basis and knows to call for any changes.   Thank you for allowing me to participate in her care.  Please do not hesitate to call for any questions or concerns.  The duration of this appointment visit was 15 minutes of face-to-face time with the patient.  Greater than 50% of this time was spent in counseling, explanation of diagnosis, planning of further management, and coordination of care.   Ellouise Newer, M.D.   CC: Dr. Philip Aspen

## 2017-08-11 DIAGNOSIS — R82998 Other abnormal findings in urine: Secondary | ICD-10-CM | POA: Diagnosis not present

## 2017-08-11 DIAGNOSIS — M810 Age-related osteoporosis without current pathological fracture: Secondary | ICD-10-CM | POA: Diagnosis not present

## 2017-08-11 DIAGNOSIS — I10 Essential (primary) hypertension: Secondary | ICD-10-CM | POA: Diagnosis not present

## 2017-08-11 DIAGNOSIS — E7849 Other hyperlipidemia: Secondary | ICD-10-CM | POA: Diagnosis not present

## 2017-08-17 DIAGNOSIS — I10 Essential (primary) hypertension: Secondary | ICD-10-CM | POA: Diagnosis not present

## 2017-08-17 DIAGNOSIS — M25552 Pain in left hip: Secondary | ICD-10-CM | POA: Diagnosis not present

## 2017-08-17 DIAGNOSIS — B0229 Other postherpetic nervous system involvement: Secondary | ICD-10-CM | POA: Diagnosis not present

## 2017-08-17 DIAGNOSIS — R42 Dizziness and giddiness: Secondary | ICD-10-CM | POA: Diagnosis not present

## 2017-08-17 DIAGNOSIS — Z0000 Encounter for general adult medical examination without abnormal findings: Secondary | ICD-10-CM | POA: Diagnosis not present

## 2017-08-17 DIAGNOSIS — Z6829 Body mass index (BMI) 29.0-29.9, adult: Secondary | ICD-10-CM | POA: Diagnosis not present

## 2017-08-17 DIAGNOSIS — R600 Localized edema: Secondary | ICD-10-CM | POA: Diagnosis not present

## 2017-08-17 DIAGNOSIS — M810 Age-related osteoporosis without current pathological fracture: Secondary | ICD-10-CM | POA: Diagnosis not present

## 2017-08-27 DIAGNOSIS — Z1212 Encounter for screening for malignant neoplasm of rectum: Secondary | ICD-10-CM | POA: Diagnosis not present

## 2017-11-19 DIAGNOSIS — I10 Essential (primary) hypertension: Secondary | ICD-10-CM | POA: Diagnosis not present

## 2017-11-19 DIAGNOSIS — B0229 Other postherpetic nervous system involvement: Secondary | ICD-10-CM | POA: Diagnosis not present

## 2017-11-19 DIAGNOSIS — H04129 Dry eye syndrome of unspecified lacrimal gland: Secondary | ICD-10-CM | POA: Diagnosis not present

## 2017-11-19 DIAGNOSIS — Z6829 Body mass index (BMI) 29.0-29.9, adult: Secondary | ICD-10-CM | POA: Diagnosis not present

## 2018-01-06 DIAGNOSIS — H1031 Unspecified acute conjunctivitis, right eye: Secondary | ICD-10-CM | POA: Diagnosis not present

## 2018-03-08 DIAGNOSIS — I10 Essential (primary) hypertension: Secondary | ICD-10-CM | POA: Diagnosis not present

## 2018-03-08 DIAGNOSIS — R05 Cough: Secondary | ICD-10-CM | POA: Diagnosis not present

## 2018-03-08 DIAGNOSIS — Z6829 Body mass index (BMI) 29.0-29.9, adult: Secondary | ICD-10-CM | POA: Diagnosis not present

## 2018-05-09 DIAGNOSIS — H353131 Nonexudative age-related macular degeneration, bilateral, early dry stage: Secondary | ICD-10-CM | POA: Diagnosis not present

## 2018-05-09 DIAGNOSIS — H524 Presbyopia: Secondary | ICD-10-CM | POA: Diagnosis not present

## 2018-05-09 DIAGNOSIS — Z961 Presence of intraocular lens: Secondary | ICD-10-CM | POA: Diagnosis not present

## 2018-06-18 DIAGNOSIS — Z23 Encounter for immunization: Secondary | ICD-10-CM | POA: Diagnosis not present

## 2018-06-22 LAB — HEPATIC FUNCTION PANEL
AST: 18 (ref 13–35)
Alkaline Phosphatase: 88 (ref 25–125)
Bilirubin, Total: 0.6

## 2018-06-22 LAB — CBC AND DIFFERENTIAL
HCT: 45 (ref 36–46)
Hemoglobin: 14.1 (ref 12.0–16.0)
Platelets: 248 (ref 150–399)
WBC: 6.7

## 2018-06-22 LAB — COMPREHENSIVE METABOLIC PANEL
Albumin: 3.9 (ref 3.5–5.0)
Calcium: 9.8 (ref 8.7–10.7)
GFR calc Af Amer: 72
GFR calc non Af Amer: 59.5

## 2018-06-22 LAB — CBC: RBC: 4.7 (ref 3.87–5.11)

## 2018-06-22 LAB — VITAMIN D 25 HYDROXY (VIT D DEFICIENCY, FRACTURES): Vit D, 25-Hydroxy: 35.7

## 2018-06-22 LAB — LIPID PANEL
Cholesterol: 176 (ref 0–200)
HDL: 66 (ref 35–70)
LDL Cholesterol: 92
LDl/HDL Ratio: 1.4
Triglycerides: 88 (ref 40–160)

## 2018-06-22 LAB — BASIC METABOLIC PANEL
BUN: 15 (ref 4–21)
CO2: 27 — AB (ref 13–22)
Chloride: 106 (ref 99–108)
Creatinine: 0.9 (ref 0.5–1.1)
Glucose: 94
Potassium: 4.4 (ref 3.4–5.3)
Sodium: 140 (ref 137–147)

## 2018-08-22 DIAGNOSIS — R82998 Other abnormal findings in urine: Secondary | ICD-10-CM | POA: Diagnosis not present

## 2018-08-22 DIAGNOSIS — E7849 Other hyperlipidemia: Secondary | ICD-10-CM | POA: Diagnosis not present

## 2018-08-22 DIAGNOSIS — M810 Age-related osteoporosis without current pathological fracture: Secondary | ICD-10-CM | POA: Diagnosis not present

## 2018-08-22 DIAGNOSIS — I10 Essential (primary) hypertension: Secondary | ICD-10-CM | POA: Diagnosis not present

## 2018-08-26 DIAGNOSIS — Z1212 Encounter for screening for malignant neoplasm of rectum: Secondary | ICD-10-CM | POA: Diagnosis not present

## 2018-08-26 LAB — FECAL OCCULT BLOOD, GUAIAC: Fecal Occult Blood: NEGATIVE

## 2018-08-29 DIAGNOSIS — R600 Localized edema: Secondary | ICD-10-CM | POA: Diagnosis not present

## 2018-08-29 DIAGNOSIS — M25552 Pain in left hip: Secondary | ICD-10-CM | POA: Diagnosis not present

## 2018-08-29 DIAGNOSIS — I10 Essential (primary) hypertension: Secondary | ICD-10-CM | POA: Diagnosis not present

## 2018-08-29 DIAGNOSIS — M810 Age-related osteoporosis without current pathological fracture: Secondary | ICD-10-CM | POA: Diagnosis not present

## 2018-08-29 DIAGNOSIS — M545 Low back pain: Secondary | ICD-10-CM | POA: Diagnosis not present

## 2018-08-29 DIAGNOSIS — Z6829 Body mass index (BMI) 29.0-29.9, adult: Secondary | ICD-10-CM | POA: Diagnosis not present

## 2018-08-29 DIAGNOSIS — B0229 Other postherpetic nervous system involvement: Secondary | ICD-10-CM | POA: Diagnosis not present

## 2018-08-29 DIAGNOSIS — F0390 Unspecified dementia without behavioral disturbance: Secondary | ICD-10-CM | POA: Diagnosis not present

## 2018-08-29 DIAGNOSIS — Z0000 Encounter for general adult medical examination without abnormal findings: Secondary | ICD-10-CM | POA: Diagnosis not present

## 2018-11-03 ENCOUNTER — Emergency Department (HOSPITAL_COMMUNITY): Payer: Medicare Other

## 2018-11-03 ENCOUNTER — Emergency Department (HOSPITAL_COMMUNITY)
Admission: EM | Admit: 2018-11-03 | Discharge: 2018-11-03 | Disposition: A | Discharge status: Home / Self Care | Payer: Medicare Other | Attending: Emergency Medicine | Admitting: Emergency Medicine

## 2018-11-03 ENCOUNTER — Encounter (HOSPITAL_COMMUNITY): Payer: Self-pay | Admitting: Emergency Medicine

## 2018-11-03 ENCOUNTER — Other Ambulatory Visit: Payer: Self-pay

## 2018-11-03 DIAGNOSIS — R1013 Epigastric pain: Secondary | ICD-10-CM | POA: Diagnosis not present

## 2018-11-03 DIAGNOSIS — R079 Chest pain, unspecified: Secondary | ICD-10-CM | POA: Diagnosis not present

## 2018-11-03 DIAGNOSIS — I10 Essential (primary) hypertension: Secondary | ICD-10-CM | POA: Diagnosis not present

## 2018-11-03 DIAGNOSIS — R002 Palpitations: Secondary | ICD-10-CM | POA: Diagnosis not present

## 2018-11-03 DIAGNOSIS — R42 Dizziness and giddiness: Secondary | ICD-10-CM | POA: Diagnosis not present

## 2018-11-03 DIAGNOSIS — R000 Tachycardia, unspecified: Secondary | ICD-10-CM | POA: Diagnosis not present

## 2018-11-03 LAB — CBC WITH DIFFERENTIAL/PLATELET
Abs Immature Granulocytes: 0.03 K/uL (ref 0.00–0.07)
Basophils Absolute: 0 K/uL (ref 0.0–0.1)
Basophils Relative: 1 %
Eosinophils Absolute: 0.3 K/uL (ref 0.0–0.5)
Eosinophils Relative: 3 %
HCT: 42.4 % (ref 36.0–46.0)
Hemoglobin: 13.9 g/dL (ref 12.0–15.0)
Immature Granulocytes: 0 %
Lymphocytes Relative: 27 %
Lymphs Abs: 2.1 K/uL (ref 0.7–4.0)
MCH: 30 pg (ref 26.0–34.0)
MCHC: 32.8 g/dL (ref 30.0–36.0)
MCV: 91.6 fL (ref 80.0–100.0)
Monocytes Absolute: 1 K/uL (ref 0.1–1.0)
Monocytes Relative: 13 %
Neutro Abs: 4.4 K/uL (ref 1.7–7.7)
Neutrophils Relative %: 56 %
Platelets: 253 K/uL (ref 150–400)
RBC: 4.63 MIL/uL (ref 3.87–5.11)
RDW: 14.8 % (ref 11.5–15.5)
WBC: 7.8 K/uL (ref 4.0–10.5)
nRBC: 0 % (ref 0.0–0.2)

## 2018-11-03 LAB — COMPREHENSIVE METABOLIC PANEL WITH GFR
ALT: 21 U/L (ref 0–44)
AST: 24 U/L (ref 15–41)
Albumin: 3.7 g/dL (ref 3.5–5.0)
Alkaline Phosphatase: 79 U/L (ref 38–126)
Anion gap: 13 (ref 5–15)
BUN: 21 mg/dL (ref 8–23)
CO2: 20 mmol/L — ABNORMAL LOW (ref 22–32)
Calcium: 10.1 mg/dL (ref 8.9–10.3)
Chloride: 104 mmol/L (ref 98–111)
Creatinine, Ser: 1.15 mg/dL — ABNORMAL HIGH (ref 0.44–1.00)
GFR calc Af Amer: 50 mL/min — ABNORMAL LOW
GFR calc non Af Amer: 43 mL/min — ABNORMAL LOW
Glucose, Bld: 106 mg/dL — ABNORMAL HIGH (ref 70–99)
Potassium: 4.2 mmol/L (ref 3.5–5.1)
Sodium: 137 mmol/L (ref 135–145)
Total Bilirubin: 0.7 mg/dL (ref 0.3–1.2)
Total Protein: 6.6 g/dL (ref 6.5–8.1)

## 2018-11-03 LAB — URINALYSIS, ROUTINE W REFLEX MICROSCOPIC
Bilirubin Urine: NEGATIVE
Glucose, UA: NEGATIVE mg/dL
Hgb urine dipstick: NEGATIVE
Ketones, ur: 5 mg/dL — AB
Leukocytes,Ua: NEGATIVE
Nitrite: NEGATIVE
Protein, ur: NEGATIVE mg/dL
Specific Gravity, Urine: 1.008 (ref 1.005–1.030)
pH: 7 (ref 5.0–8.0)

## 2018-11-03 LAB — TROPONIN I
Troponin I: <0.03 ng/mL (ref <0.03)
Troponin I: <0.03 ng/mL

## 2018-11-03 LAB — LIPASE, BLOOD: Lipase: 36 U/L (ref 11–51)

## 2018-11-03 NOTE — ED Notes (Signed)
Pt ambulatory to bathroom

## 2018-11-03 NOTE — ED Notes (Signed)
Patient verbalizes understanding of discharge instructions. Opportunity for questioning and answers were provided. Armband removed by staff, pt discharged from ED via wheelchair to home.  

## 2018-11-03 NOTE — ED Notes (Signed)
Patient transported to X-ray 

## 2018-11-03 NOTE — ED Provider Notes (Signed)
Received care from Dr. Sedonia Small and Saunders Revel.  [phone] female presents with concern for elevated blood pressure to 170s by husband at home.  She had reported chest pain at triage, however for previous providers and on my history denies chest pain. History is limited as she does not remember specifically what symptoms she had. Reports she did have palpitations on my history.  No dyspnea, doubt PE. Delta troponins negative, doubt ACS. No sign of UTI or significant electrolyte abnormality.  Blood pressures mildly elevated at this time but would not start new BP medication given spontaneous improvement while being in the ED.  Recommend close PCP follow up and follow up with Cardiology, consider outpt stress testing or holter monitoring. Patient discharged in stable condition with understanding of reasons to return.    Gareth Morgan, MD 11/04/18 2337

## 2018-11-03 NOTE — ED Triage Notes (Signed)
[facility]- pt comes from home. Pt complains of chest pain this morning. Pt was sitting and felt severe pressure and got up and felt like she was going to pass out. Pt complained of 5/10 pain. EMS gave 2 nitro and 324 ASA. Pain went away but is now back.Denies SOB, N/V. Pt takes no meds and has never been diagnosed with any medical conditions.   BP 178/96 O2 100% RA

## 2018-11-03 NOTE — ED Provider Notes (Signed)
[facility] EMERGENCY DEPARTMENT Provider Note   CSN: [id number] Arrival date & time: 11/03/18  1317    History   Chief Complaint No chief complaint on file.   HPI Melissa Valenzuela is a 85 y.o. female.     The history is provided by the patient. No language interpreter was used.  Abdominal Pain  Pain location:  Generalized Pain quality: aching   Pain radiates to:  Does not radiate Timing:  Constant Progression:  Worsening Chronicity:  New Worsened by:  Nothing Ineffective treatments:  None tried Risk factors: has not had multiple surgeries    Pt complains of elevated blood pressure and elevated heart rate.  Pt reports she had some discomfort in her abdomen that has now resolved.  Pt reports her husband checked her blood pressure and her pulse and both were elevated Past Medical History:  Diagnosis Date  . Cataract   . Chest pain 02/16/2010   Myoview, negative for pharmacologic-stress induced ischemia, EF 74  . Complication of anesthesia 1954   hard to wake up  . Diverticulosis   . DUB (dysfunctional uterine bleeding)   . Dysrhythmia    h/o PVCs- none recently- no meds  . GERD (gastroesophageal reflux disease)   . Hypertension    no meds currently  . Loss of consciousness ([place])   . Palpitation 02/02/13   A CardioNet MCOT (mobile cardiac outpatient telemetry ): NSR with PACs; sinus tachycardia with ventricular trigeminy. Brief atrial run. Facet cardiac; ventricular trigeminy and accelerated idioventricular rhythm. No significant tachyarrhythmias.  . Palpitations   . Postmenopausal bleeding 11/01/2014  . Varicose veins of both legs with pain     Patient Active Problem List   Diagnosis Date Noted  . HZV (herpes zoster virus) post herpetic neuralgia 11/27/2016  . Postural dizziness with near syncope 07/07/2016  . Postmenopausal bleeding 11/01/2014  . Venous insufficiency - painful varicose veins, restless legs, heaviness 01/25/2013  . Palpitations  01/24/2013  . Essential hypertension - labile 01/24/2013    Past Surgical History:  Procedure Laterality Date  . APPENDECTOMY    . CATARACT EXTRACTION     both eyes  . [place], [id number]   x 4  . DIAGNOSTIC LAPAROSCOPY    . FRACTURE SURGERY    . HIP SURGERY Right   . HYSTEROSCOPY W/D&C N/A 11/02/2014   Procedure: DILATATION AND CURETTAGE /HYSTEROSCOPY;  Surgeon: Janyth Contes, MD;  Location: [facility];  Service: Gynecology;  Laterality: N/A;  . Lower Extremity Venous Dopplers  May 2014   No thrombus or thrombophlebitis. R Greater Saphenous Vein - enlarged without other insufficiency (6 mm); right and left Small Saphenous Vein: Patent no valve insufficiency.  . TONSILLECTOMY    . TONSILLECTOMY AND ADENOIDECTOMY    . TRANSTHORACIC ECHOCARDIOGRAM  May 2014   Normal LV size and function. EF 55-60%. Grade 1 diastolic dysfunction. Elevated LV filling pressures. No regional W. May.     OB History   No obstetric history on file.      Home Medications    Prior to Admission medications   Not on File    Family History Family History  Problem Relation Age of Onset  . Stroke Mother 84  . Hypertension Mother   . Colon cancer Mother 80  . Hypertension Father   . Stroke Father        2  . Hypertension Son 43       Started on BP medicine age 89/24  Social History Social History   Tobacco Use  . Smoking status: Never Smoker  . Smokeless tobacco: Never Used  Substance Use Topics  . Alcohol use: Yes    Alcohol/week: 7.0 standard drinks    Types: 7 Standard drinks or equivalent per week    Comment: occasionally  . Drug use: No     Allergies   Moxifloxacin   Review of Systems Review of Systems  Gastrointestinal: Positive for abdominal pain.  All other systems reviewed and are negative.    Physical Exam Updated Vital Signs Temp (!) 97.3 F (36.3 C) (Oral)   Ht 5\' 5"  (1.651 m)   Wt 72.6 kg   BMI 26.63 kg/m   Physical Exam Vitals  signs reviewed.  HENT:     Head: Normocephalic and atraumatic.     Nose: Nose normal.     Mouth/Throat:     Mouth: Mucous membranes are moist.  Neck:     Musculoskeletal: Normal range of motion.  Cardiovascular:     Rate and Rhythm: Normal rate.     Pulses: Normal pulses.  Pulmonary:     Effort: Pulmonary effort is normal.  Abdominal:     General: Abdomen is flat.  Musculoskeletal: Normal range of motion.  Skin:    General: Skin is warm.  Neurological:     General: No focal deficit present.     Mental Status: She is alert.  Psychiatric:        Mood and Affect: Mood normal.      ED Treatments / Results  Labs (all labs ordered are listed, but only abnormal results are displayed) Labs Reviewed  CBC WITH DIFFERENTIAL/PLATELET  COMPREHENSIVE METABOLIC PANEL  LIPASE, BLOOD  TROPONIN I  URINALYSIS, ROUTINE W REFLEX MICROSCOPIC    EKG EKG Interpretation  Date/Time:  Thursday November 03 2018 13:21:35 EST Ventricular Rate:  73 PR Interval:    QRS Duration: 117 QT Interval:  402 QTC Calculation: 443 R Axis:   47 Text Interpretation:  Sinus rhythm Ventricular premature complex Nonspecific intraventricular conduction delay Low voltage, precordial leads Confirmed by Gerlene Fee [phone]) on 11/03/2018 2:03:39 PM   Radiology No results found.  Procedures Procedures (including critical care time)  Medications Ordered in ED Medications - No data to display   Initial Impression / Assessment and Plan / ED Course  I have reviewed the triage vital signs and the nursing notes.  Pertinent labs & imaging results that were available during my care of the patient were reviewed by me and considered in my medical decision making (see chart for details).       MDM  Pt has a negative troponin x 1.  Second troponin ordered for 4:30.  Pt comfortable.  No complaints  Pt's care turned over 4pm  Labs pending  Final Clinical Impressions(s) / ED Diagnoses   Final diagnoses:    Essential hypertension  Nonspecific chest pain    ED Discharge Orders    None       Sidney Ace 11/03/18 1527    Maudie Flakes, MD 11/03/18 [phone]

## 2018-11-07 DIAGNOSIS — R002 Palpitations: Secondary | ICD-10-CM | POA: Diagnosis not present

## 2018-11-07 DIAGNOSIS — I10 Essential (primary) hypertension: Secondary | ICD-10-CM | POA: Diagnosis not present

## 2018-11-07 DIAGNOSIS — Z6829 Body mass index (BMI) 29.0-29.9, adult: Secondary | ICD-10-CM | POA: Diagnosis not present

## 2018-11-07 DIAGNOSIS — R1013 Epigastric pain: Secondary | ICD-10-CM | POA: Diagnosis not present

## 2018-11-16 DIAGNOSIS — H5789 Other specified disorders of eye and adnexa: Secondary | ICD-10-CM | POA: Diagnosis not present

## 2019-05-04 DIAGNOSIS — Z23 Encounter for immunization: Secondary | ICD-10-CM | POA: Diagnosis not present

## 2019-05-18 DIAGNOSIS — Z961 Presence of intraocular lens: Secondary | ICD-10-CM | POA: Diagnosis not present

## 2019-05-18 DIAGNOSIS — H353131 Nonexudative age-related macular degeneration, bilateral, early dry stage: Secondary | ICD-10-CM | POA: Diagnosis not present

## 2019-08-25 DIAGNOSIS — M810 Age-related osteoporosis without current pathological fracture: Secondary | ICD-10-CM | POA: Diagnosis not present

## 2019-08-25 DIAGNOSIS — E7849 Other hyperlipidemia: Secondary | ICD-10-CM | POA: Diagnosis not present

## 2019-08-25 LAB — LIPID PANEL
Cholesterol: 172 (ref 0–200)
HDL: 68 (ref 35–70)
LDL Cholesterol: 88
Triglycerides: 81 (ref 40–160)

## 2019-08-25 LAB — BASIC METABOLIC PANEL
BUN: 21 (ref 4–21)
CO2: 27 — AB (ref 13–22)
Chloride: 105 (ref 99–108)
Creatinine: 0.9 (ref 0.5–1.1)
Glucose: 84
Potassium: 4.7 (ref 3.4–5.3)
Sodium: 137 (ref 137–147)

## 2019-08-25 LAB — COMPREHENSIVE METABOLIC PANEL
Albumin: 3.4 — AB (ref 3.5–5.0)
Calcium: 9.4 (ref 8.7–10.7)
GFR calc Af Amer: 71.8
GFR calc non Af Amer: 59.4

## 2019-08-25 LAB — CBC: RBC: 4.5 (ref 3.87–5.11)

## 2019-08-25 LAB — HEPATIC FUNCTION PANEL
ALT: 15 (ref 7–35)
AST: 17 (ref 13–35)
Alkaline Phosphatase: 86 (ref 25–125)
Bilirubin, Total: 0.5

## 2019-08-25 LAB — CBC AND DIFFERENTIAL
HCT: 43 (ref 36–46)
Hemoglobin: 13.2 (ref 12.0–16.0)
Platelets: 264 (ref 150–399)
WBC: 6.8

## 2019-08-25 LAB — VITAMIN D 25 HYDROXY (VIT D DEFICIENCY, FRACTURES): Vit D, 25-Hydroxy: 46.1

## 2019-09-20 DIAGNOSIS — E7849 Other hyperlipidemia: Secondary | ICD-10-CM | POA: Diagnosis not present

## 2019-09-20 DIAGNOSIS — I10 Essential (primary) hypertension: Secondary | ICD-10-CM | POA: Diagnosis not present

## 2019-09-21 LAB — VITAMIN B12: Vitamin B-12: 729

## 2019-09-21 LAB — TSH: TSH: 2.66 (ref 0.41–5.90)

## 2019-09-26 ENCOUNTER — Ambulatory Visit: Payer: Medicare Other | Admitting: Cardiology

## 2019-09-27 ENCOUNTER — Other Ambulatory Visit: Payer: Self-pay

## 2019-09-27 ENCOUNTER — Ambulatory Visit (INDEPENDENT_AMBULATORY_CARE_PROVIDER_SITE_OTHER): Payer: Medicare Other | Admitting: Cardiology

## 2019-09-27 ENCOUNTER — Encounter: Payer: Self-pay | Admitting: Cardiology

## 2019-09-27 VITALS — BP 126/72 | HR 72 | Temp 93.2°F | Ht 67.0 in | Wt 183.2 lb

## 2019-09-27 DIAGNOSIS — R002 Palpitations: Secondary | ICD-10-CM | POA: Diagnosis not present

## 2019-09-27 NOTE — Progress Notes (Signed)
Primary Care Provider: Leanna Battles, MD Cardiologist: No primary care provider on file. Electrophysiologist:   Clinic Note: No chief complaint on file.  HPI:    Melissa Valenzuela is a 86 y.o. female with a PMH below who presents today for delayed follow-up for palpitations and prior history of near syncope at the request of Leanna Battles, MD.  Melissa Valenzuela was last seen on in October 2017 which at that time was also almost a 3-year follow-up.  Apparently she had had a near syncopal episode while in route to [place] back in September 2017 where she had likely syncopal event.  She has baseline now starting to have significant dementia and is accompanied by her husband.  Recent Hospitalizations:    She went to the emergency room in February 2020 with concerns of elevated blood pressure and some palpitations.  Relatively benign.  Reviewed  CV studies:    The following studies were reviewed today: (if available, images/films reviewed: From Epic Chart or Care Everywhere) . No new studies:   Interval History:   Melissa Valenzuela returns here with her husband today really with no complaints.  She not having any edema.  They really don't notice any symptoms of palpitations or irregular heartbeats.  She has not had any further syncopal events.  She is only on aspirin and has not had any major issues. After her February 2020 ER visit, they suggested may be that she have stress test or Holter monitor done, but she never was seen at that point, and at this point in time she is not having any further episodes so therefore I don't think it is reasonable to do any further testing.  She is minimally mobile and has significant dementia.  She is not having any symptoms that she her husband can think of.   CV Review of Symptoms (Summary) Cardiovascular ROS: no chest pain or dyspnea on exertion positive for - A little bit of an unsteady gait, poor balance.  Some  dizziness. negative for - edema, irregular heartbeat, orthopnea, palpitations, paroxysmal nocturnal dyspnea, rapid heart rate, shortness of breath or Syncope/near syncope or TIA/amaurosis fugax.  Claudication.  The patient does not have symptoms concerning for COVID-19 infection (fever, chills, cough, or new shortness of breath).  The patient is practicing social distancing & Masking.  A pretty much stay home and do not go out to be around other people.  REVIEWED OF SYSTEMS    Review of Systems  Constitutional: Negative for malaise/fatigue.  HENT: Negative for nosebleeds.   Respiratory: Negative for shortness of breath.   Gastrointestinal: Negative for blood in stool and melena.  Genitourinary: Negative for flank pain and hematuria.  Musculoskeletal: Positive for joint pain. Negative for falls.  Neurological: Positive for dizziness and weakness (Generalized). Negative for focal weakness and loss of consciousness.  Psychiatric/Behavioral: Positive for memory loss.     I have reviewed and (if needed) personally updated the patient's problem list, medications, allergies, past medical and surgical history, social and family history.   PAST MEDICAL HISTORY   Past Medical History:  Diagnosis Date  . Cataract   . Chest pain 02/16/2010   Myoview, negative for pharmacologic-stress induced ischemia, EF 74  . Complication of anesthesia 1954   hard to wake up  . Diverticulosis   . DUB (dysfunctional uterine bleeding)   . Dysrhythmia    h/o PVCs- none recently- no meds  . GERD (gastroesophageal reflux disease)   . Hypertension    no meds currently  .  Loss of consciousness ([place])   . Palpitation 02/02/13   A CardioNet MCOT (mobile cardiac outpatient telemetry ): NSR with PACs; sinus tachycardia with ventricular trigeminy. Brief atrial run. Facet cardiac; ventricular trigeminy and accelerated idioventricular rhythm. No significant tachyarrhythmias.  . Palpitations   . Postmenopausal bleeding  11/01/2014  . Varicose veins of both legs with pain      PAST SURGICAL HISTORY   Past Surgical History:  Procedure Laterality Date  . APPENDECTOMY    . CATARACT EXTRACTION     both eyes  . [place], [id number]   x 4  . DIAGNOSTIC LAPAROSCOPY    . FRACTURE SURGERY    . HIP SURGERY Right   . HYSTEROSCOPY WITH D & C N/A 11/02/2014   Procedure: DILATATION AND CURETTAGE /HYSTEROSCOPY;  Surgeon: Janyth Contes, MD;  Location: [facility];  Service: Gynecology;  Laterality: N/A;  . Lower Extremity Venous Dopplers  May 2014   No thrombus or thrombophlebitis. R Greater Saphenous Vein - enlarged without other insufficiency (6 mm); right and left Small Saphenous Vein: Patent no valve insufficiency.  . TONSILLECTOMY    . TONSILLECTOMY AND ADENOIDECTOMY    . TRANSTHORACIC ECHOCARDIOGRAM  May 2014   Normal LV size and function. EF 55-60%. Grade 1 diastolic dysfunction. Elevated LV filling pressures. No regional W. May.     MEDICATIONS/ALLERGIES   Current Meds  Medication Sig  . aspirin EC 81 MG tablet Take 81 mg by mouth once.    Allergies  Allergen Reactions  . Moxifloxacin Rash     SOCIAL HISTORY/FAMILY HISTORY   Social History   Tobacco Use  . Smoking status: Never Smoker  . Smokeless tobacco: Never Used  Substance Use Topics  . Alcohol use: Yes    Alcohol/week: 7.0 standard drinks    Types: 7 Standard drinks or equivalent per week    Comment: occasionally  . Drug use: No   Social History   Social History Narrative   She and her husband are relatively active, but have not been going to [employer] as much as they used to.  They have been traveling -- just returned from [place] where they did quite a bit of walking.      Family History family history includes Colon cancer (age of onset: 71) in her mother; Hypertension in her father and mother; Hypertension (age of onset: 29) in her son; Stroke in her father; Stroke (age of onset: 78) in her  mother.   OBJCTIVE -PE, EKG, labs   Wt Readings from Last 3 Encounters:  09/27/19 183 lb 3.2 oz (83.1 kg)  11/03/18 160 lb (72.6 kg)  07/20/17 179 lb (81.2 kg)    Physical Exam: BP 126/72   Pulse 72   Temp (!) 93.2 F (34 C)   Ht 5\' 7"  (1.702 m)   Wt 183 lb 3.2 oz (83.1 kg)   SpO2 99%   BMI 28.69 kg/m  Physical Exam  Constitutional: She is oriented to person, place, and time. She appears well-developed and well-nourished.  Healthy-appearing elderly woman.  No acute distress  HENT:  Head: Normocephalic and atraumatic.  Neck: Carotid bruit is not present.  Cardiovascular: Normal rate, regular rhythm, normal heart sounds and intact distal pulses.  No extrasystoles are present. PMI is displaced. Exam reveals no gallop and no friction rub.  No murmur heard. Pulmonary/Chest: Effort normal and breath sounds normal. No respiratory distress. She has no wheezes. She has no rales.  Musculoskeletal:  General: No edema. Normal range of motion.     Cervical back: Normal range of motion and neck supple.  Neurological: She is alert and oriented to person, place, and time.  Psychiatric: She has a normal mood and affect.  Somewhat confused.  Somewhat complicated also has a hard of hearing. Answer some questions, but will defer to her husband.  Vitals reviewed.   Adult ECG Report  Rate: 72 ;  Rhythm: normal sinus rhythm and Normal axis, intervals and durations.  Borderline low voltage.;   Narrative Interpretation: Normal EKG  Recent Labs: Checked in December 2020-TC 172, TG 81, HDL 60, LDL 88.  CR 0.9.  K+ 4.2.  Otherwise stable.   ASSESSMENT/PLAN   Ms. Wintjen is here today probably because of a phone call made from a callback list made long ago.  She and her husband are not sure of anything new.  No new findings are found.  Her blood pressure looks good.  She has some mild palpitations but nothing significant.  Has not had any further syncopal episodes.  She is only on  aspirin.    She is not having symptoms and has excellent lipids and blood pressure without any medications.  I would basically leave her alone I would not pursue any further testing as she is asymptomatic and clearly has relatively significant dementia and her advanced age.  Problem List Items Addressed This Visit    Palpitations - Primary (Chronic)   Relevant Orders   EKG 12-Lead      COVID-19 Education: The signs and symptoms of COVID-19 were discussed with the patient and how to seek care for testing (follow up with PCP or arrange E-visit).   The importance of social distancing was discussed today.  I spent a total of 10 minutes with the patient and chart review. >  50% of the time was spent in direct patient consultation.  Additional time spent with chart review (studies, outside notes, etc): 6 Total Time: 16 min   Current medicines are reviewed at length with the patient today.  (+/- concerns) n/a   Patient Instructions / Medication Changes & Studies & Tests Ordered   Patient Instructions  Medication Instructions:  No changes  Lab Work: Not needed  Testing/Procedures: Not needed  Follow-Up: At [facility], you and your health needs are our priority.  As part of our continuing mission to provide you with exceptional heart care, we have created designated Provider Care Teams.  These Care Teams include your primary Cardiologist (physician) and Advanced Practice Providers (APPs -  Physician Assistants and Nurse Practitioners) who all work together to provide you with the care you need, when you need it.  Your next appointment:   As needed    The format for your next appointment:   Either In Person or Virtual  Provider:   Glenetta Hew, MD      Studies Ordered:   Orders Placed This Encounter  Procedures  . EKG 12-Lead     Glenetta Hew, M.D., M.S. Interventional Cardiologist   Pager # [phone] Phone # [phone] [address] [place], [place] [address]   Thank you for choosing Heartcare at [facility]!!

## 2019-09-27 NOTE — Patient Instructions (Signed)
Medication Instructions:  No changes  Lab Work: Not needed  Testing/Procedures: Not needed  Follow-Up: At [facility], you and your health needs are our priority.  As part of our continuing mission to provide you with exceptional heart care, we have created designated Provider Care Teams.  These Care Teams include your primary Cardiologist (physician) and Advanced Practice Providers (APPs -  Physician Assistants and Nurse Practitioners) who all work together to provide you with the care you need, when you need it.  Your next appointment:   As needed    The format for your next appointment:   Either In Person or Virtual  Provider:   Glenetta Hew, MD

## 2019-09-29 ENCOUNTER — Encounter: Payer: Self-pay | Admitting: Cardiology

## 2019-10-03 ENCOUNTER — Ambulatory Visit: Payer: Medicare Other | Attending: Internal Medicine

## 2019-10-03 DIAGNOSIS — Z23 Encounter for immunization: Secondary | ICD-10-CM

## 2019-10-03 NOTE — Progress Notes (Signed)
   Covid-19 Vaccination Clinic  Name:  Melissa Valenzuela    MRN: [id number] DOB: 1931-04-18  10/03/2019  [name] was observed post Covid-19 immunization for 15 minutes without incidence. She was provided with Vaccine Information Sheet and instruction to access the V-Safe system.   [name] was instructed to call 911 with any severe reactions post vaccine: Marland Kitchen Difficulty breathing  . Swelling of your face and throat  . A fast heartbeat  . A bad rash all over your body  . Dizziness and weakness    Immunizations Administered    Name Date Dose VIS Date Route   Pfizer COVID-19 Vaccine 10/03/2019 10:16 AM 0.3 mL 08/25/2019 Intramuscular   Manufacturer: [employer], [employer]   Lot: [id number]   [place]: [id number]

## 2019-10-11 DIAGNOSIS — H10502 Unspecified blepharoconjunctivitis, left eye: Secondary | ICD-10-CM | POA: Diagnosis not present

## 2019-10-22 ENCOUNTER — Ambulatory Visit: Payer: Medicare Other | Attending: Internal Medicine

## 2019-10-22 DIAGNOSIS — Z23 Encounter for immunization: Secondary | ICD-10-CM | POA: Insufficient documentation

## 2019-10-22 NOTE — Progress Notes (Signed)
   Covid-19 Vaccination Clinic  Name:  Merrilynn Tieken    MRN: [id number] DOB: Aug 07, 1931  10/22/2019  Ms. Siple was observed post Covid-19 immunization for 15 minutes without incidence. She was provided with Vaccine Information Sheet and instruction to access the V-Safe system.   Ms. Dolsen was instructed to call 911 with any severe reactions post vaccine: Marland Kitchen Difficulty breathing  . Swelling of your face and throat  . A fast heartbeat  . A bad rash all over your body  . Dizziness and weakness    Immunizations Administered    Name Date Dose VIS Date Route   Pfizer COVID-19 Vaccine 10/22/2019  1:52 PM 0.3 mL 08/25/2019 Intramuscular   Manufacturer: [place]   Lot: [id number]   [place]: [id number]

## 2019-10-23 ENCOUNTER — Telehealth: Payer: Self-pay | Admitting: Neurology

## 2019-10-23 ENCOUNTER — Encounter: Payer: Self-pay | Admitting: Neurology

## 2019-10-23 ENCOUNTER — Ambulatory Visit (INDEPENDENT_AMBULATORY_CARE_PROVIDER_SITE_OTHER): Payer: Medicare Other | Admitting: Neurology

## 2019-10-23 ENCOUNTER — Other Ambulatory Visit: Payer: Self-pay

## 2019-10-23 VITALS — BP 160/88 | HR 84 | Temp 97.5°F | Ht 65.0 in | Wt 175.0 lb

## 2019-10-23 DIAGNOSIS — R413 Other amnesia: Secondary | ICD-10-CM | POA: Diagnosis not present

## 2019-10-23 DIAGNOSIS — R42 Dizziness and giddiness: Secondary | ICD-10-CM

## 2019-10-23 DIAGNOSIS — R55 Syncope and collapse: Secondary | ICD-10-CM

## 2019-10-23 DIAGNOSIS — F0390 Unspecified dementia without behavioral disturbance: Secondary | ICD-10-CM | POA: Insufficient documentation

## 2019-10-23 NOTE — Addendum Note (Signed)
Addended by: Larey Seat on: 10/23/2019 02:40 PM   Modules accepted: Orders

## 2019-10-23 NOTE — Progress Notes (Signed)
Provider:  Larey Valenzuela, M D  Referring Provider: Leanna Battles, MD Primary Care Physician:  Melissa Battles, MD  Chief Complaint  Patient presents with  . New Patient (Initial Visit)    pt with husband,rm 11. pt doesnt feel that she has any concerns. the husband states their kids have noticed that there is some short term memory. pt said couple times as we were rooming that "I dont know why I am here"    HPI:  Melissa Valenzuela is a 86 y.o. female Patient seen here as a referral from Dr. Philip Valenzuela for memory evaluation.  The patient has been seen wihtin the last 3 years by another neurologist in the community but her husband wanted to change practices.  Melissa Valenzuela introduced his concerns.  His couples adult children and he have noticed that Melissa Valenzuela often asks questions repeatedly or tries to confirm the absence of a conversation that has just taken place.  Over the last year they have not had a face-to-face visit with Dr. Sharlett Valenzuela and grew increasingly concerned.  This led to the referral to neurology.  The couple is married for over 37 years. The past medical history for this patient is positive for a herpes zoster infected neuralgia for which she was followed by Melissa Valenzuela, she has never been a smoker she may drink 3 or 4 alcoholic beverages a week, she drinks 1 cup of coffee usually in the morning, she is not a soda drinker.  She has a history of diverticulosis, osteoporosis age-related, clinical GERD, in June 2011 she had some atypical chest pain, she had left knee osteoarthritis, varicosis of veins in both lower extremities.  In 2017 she suffered a fall with extensive head lacerations.  She was traveling to [place] and the  wound required surgical sutures.   In January 2018 she developed a right upper extremity shingles- following the dermatome of the 6/7 cervical nerve into the right hand and had suffered postherpetic neuralgia.   Gabapentin was prescribed it led  to fluid retention weight gain and to confusion.  In December 2019 chronic forgetfulness was already addressed and a Mini-Mental status examination score of 22 out of 30 points was reached which would indicate at least an early stage of dementia.  She has been followed for hip pain by Melissa Valenzuela.    Family history - see below.  Father had dementia in his 73s. She is the eldest of 5 siblings, her brother has developed dementia.     Social history , married for 35 years, 5 adult children- Melissa Valenzuela is 10, Melissa Valenzuela 76, daughter 4, youngest son is 59.  College - 2 years, no degree.   Review of Systems: Out of a complete 14 system review, the patient complains of only the following symptoms, and all other reviewed systems are negative. significant memory loss- 23/ 30 MMSE. Nerve pain has resolved.  Gabapentin caused confusion.    Social History   Socioeconomic History  . Marital status: Married    Spouse name: Not on file  . Number of children: Not on file  . Years of education: Not on file  . Highest education level: Not on file  Occupational History  . Not on file  Tobacco Use  . Smoking status: Never Smoker  . Smokeless tobacco: Never Used  Substance and Sexual Activity  . Alcohol use: Yes    Alcohol/week: 7.0 standard drinks    Types: 7 Standard drinks or equivalent per week  Comment: occasionally  . Drug use: No  . Sexual activity: Not on file  Other Topics Concern  . Not on file  Social History Narrative   She and her husband are relatively active, but have not been going to [employer] as much as they used to.   They haven not  been traveling under Melissa Valenzuela.     Social Determinants of Health   Financial Resource Strain:   . Difficulty of Paying Living Expenses: Not on file  Food Insecurity:   . Worried About Charity fundraiser in the Last Year: Not on file  . Ran Out of Food in the Last Year: Not on file  Transportation Needs:   . Lack of Transportation  (Medical): Not on file  . Lack of Transportation (Non-Medical): Not on file  Physical Activity:   . Days of Exercise per Week: Not on file  . Minutes of Exercise per Session: Not on file  Stress:   . Feeling of Stress : Not on file  Social Connections:   . Frequency of Communication with Friends and Family: Not on file  . Frequency of Social Gatherings with Friends and Family: Not on file  . Attends Religious Services: Not on file  . Active Member of Clubs or Organizations: Not on file  . Attends Archivist Meetings: Not on file  . Marital Status: Not on file  Intimate Partner Violence:   . Fear of Current or Ex-Partner: Not on file  . Emotionally Abused: Not on file  . Physically Abused: Not on file  . Sexually Abused: Not on file    Family History  Problem Relation Age of Onset  . Stroke Mother 53  . Hypertension Mother   . Colon cancer Mother 89  . Hypertension Father   . Stroke Father        2  . Dementia Father   . Hypertension Son 73       Started on BP medicine age [age]    Past Medical History:  Diagnosis Date  . Cataract   . Chest pain 02/16/2010   Myoview, negative for pharmacologic-stress induced ischemia, EF 74  . Complication of anesthesia 1954   hard to wake up  . Diverticulosis   . DUB (dysfunctional uterine bleeding)   . Dysrhythmia    h/o PVCs- none recently- no meds  . GERD (gastroesophageal reflux disease)   . Hypertension    no meds currently  . Loss of consciousness ([place])   . Palpitation 02/02/13   A CardioNet MCOT (mobile cardiac outpatient telemetry ): NSR with PACs; sinus tachycardia with ventricular trigeminy. Brief atrial run. Facet cardiac; ventricular trigeminy and accelerated idioventricular rhythm. No significant tachyarrhythmias.  . Palpitations   . Postmenopausal bleeding 11/01/2014  . Varicose veins of both legs with pain     Past Surgical History:  Procedure Laterality Date  . APPENDECTOMY    . CATARACT EXTRACTION      both eyes  . [place], [id number]   x 4  . DIAGNOSTIC LAPAROSCOPY    . FRACTURE SURGERY    . HIP SURGERY Right   . HYSTEROSCOPY WITH D & C N/A 11/02/2014   Procedure: DILATATION AND CURETTAGE /HYSTEROSCOPY;  Surgeon: Janyth Contes, MD;  Location: [facility];  Service: Gynecology;  Laterality: N/A;  . Lower Extremity Venous Dopplers  May 2014   No thrombus or thrombophlebitis. R Greater Saphenous Vein - enlarged without other insufficiency (6 mm); right and left Small  Saphenous Vein: Patent no valve insufficiency.  . TONSILLECTOMY    . TONSILLECTOMY AND ADENOIDECTOMY    . TRANSTHORACIC ECHOCARDIOGRAM  May 2014   Normal LV size and function. EF 55-60%. Grade 1 diastolic dysfunction. Elevated LV filling pressures. No regional W. May.    Current Outpatient Medications  Medication Sig Dispense Refill  . aspirin EC 81 MG tablet Take 81 mg by mouth once.     No current facility-administered medications for this visit.   The couple has been given both Covid vaccine.   Allergies as of 10/23/2019 - Review Complete 10/23/2019  Allergen Reaction Noted  . Moxifloxacin Rash 04/28/2012    Vitals: There were no vitals taken for this visit. Last Weight:  Wt Readings from Last 1 Encounters:  09/27/19 183 lb 3.2 oz (83.1 kg)   Last Height:   Ht Readings from Last 1 Encounters:  09/27/19 5\' 7"  (1.702 m)    Physical exam:  General: The patient is awake, alert and appears not in acute distress. The patient is well groomed. Head: Normocephalic, atraumatic.  Neck is supple. Mallampati 3, neck circumference:15 Cardiovascular:  Regular rate and rhythm, without  murmurs or carotid bruit, and without distended neck veins. Respiratory: Lungs are clear to auscultation. Skin:  Without evidence of edema, or rash Trunk: BMI is  elevated - the patient has normal posture.  Neurologic exam : The patient is awake and alert, oriented to place and time.  Memory subjective described  as intact- she is unaware of her memory difficulties. There is a normal attention span & concentration ability. Speech is fluent , no  dysarthria, dysphonia or aphasia. Mood and affect are appropriate, she became defensive.  Cranial nerves: Pupils are equal and briskly reactive to light. Funduscopic exam was deferred. . Extraocular movements  in vertical and horizontal planes intact and without nystagmus.  Visual fields by finger perimetry are intact. Hearing to finger rub intact.  Facial sensation intact to fine touch. Facial motor strength is symmetric and tongue and uvula move midline. Tongue protrusion into either cheek is normal.   Shoulder shrug is normal.   Motor exam:   Normal tone ,muscle bulk and symmetric  strength in all extremities. Sensory:  Fine touch, pinprick and vibration were tested in all extremities. Proprioception was normal.  Coordination: Rapid alternating movements in the fingers/hands were normal. Finger-to-nose maneuver  normal without evidence of ataxia, dysmetria or tremor.  Gait and station: Patient walks without assistive device and is able unassisted to climb up to the exam table. Strength within normal limits. Stance is wide based . Her right leg is pointing outwards, she has a wide base.  Turns with 5 steps.  Romberg testing is deferred.   Deep tendon reflexes: in the  upper and lower extremities are symmetric and intact. Babinski maneuver deferred  Assessment:  After physical and neurologic examination, review of laboratory studies, imaging, neurophysiology testing and pre-existing records, assessment is that of :   There is ample evidence of dementia- not alone related to hearing loss- she is many times interrupting my dictation with questions we had just discussed. She balances the check book.   No trouble with spelling, loves crossword and puzzles.   No incontinence. No shuffling. No hallucinations.   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  MMSE was the same as in 2019 - stable.   I will order an MRI of the  brain.  I will suggest namenda, rather than Aricept for this patient with bradycardia.  CMET, CBC , was done at [facility]-       Asencion Partridge Naseer Hearn MD 10/23/2019

## 2019-10-23 NOTE — Addendum Note (Signed)
Addended by: Darleen Crocker on: 10/23/2019 02:37 PM   Modules accepted: Orders

## 2019-10-23 NOTE — Patient Instructions (Signed)

## 2019-10-23 NOTE — Telephone Encounter (Signed)
Medicare/bcbs supp order sent to GI. No auth they will reach out to the patient to schedule.  

## 2019-11-13 ENCOUNTER — Other Ambulatory Visit: Payer: Self-pay

## 2019-11-13 ENCOUNTER — Ambulatory Visit
Admission: RE | Admit: 2019-11-13 | Discharge: 2019-11-13 | Disposition: A | Discharge status: Home / Self Care | Payer: Medicare Other | Source: Ambulatory Visit | Attending: Neurology | Admitting: Neurology

## 2019-11-13 DIAGNOSIS — G3184 Mild cognitive impairment, so stated: Secondary | ICD-10-CM | POA: Diagnosis not present

## 2019-11-13 DIAGNOSIS — R413 Other amnesia: Secondary | ICD-10-CM

## 2019-11-13 DIAGNOSIS — R55 Syncope and collapse: Secondary | ICD-10-CM

## 2019-11-13 DIAGNOSIS — G311 Senile degeneration of brain, not elsewhere classified: Secondary | ICD-10-CM | POA: Diagnosis not present

## 2019-11-13 DIAGNOSIS — R42 Dizziness and giddiness: Secondary | ICD-10-CM

## 2019-11-13 DIAGNOSIS — F0390 Unspecified dementia without behavioral disturbance: Secondary | ICD-10-CM

## 2019-11-13 MED ORDER — GADOBENATE DIMEGLUMINE 529 MG/ML IV SOLN
16.0000 mL | Freq: Once | INTRAVENOUS | Status: AC | PRN
  Administered 2019-11-13: 16 mL via INTRAVENOUS

## 2019-12-12 ENCOUNTER — Telehealth: Payer: Self-pay | Admitting: Neurology

## 2019-12-12 NOTE — Telephone Encounter (Signed)
Called the patient and was able to review the MRI results with the patient's husband. I advised that there was small chronic small vessel changes that were noted but otherwise normal brain. Advised there was no concern of any new changes, strokes or tumors. Advised the patient the small chronic vessel changes can be seen overtime as we age and can be related to multiple factors. The patient's husband states that he definitely notices short term memory concerns. Informed the patient that there was mention in previous office visit of potentially starting a medication to help but she may had been waiting to complete the MRI first. Advised at the follow up visit we will be able to decide if starting a medication would be beneficial. Pt's husband verbalized understanding.      IMPRESSION: 1. No acute intracranial abnormality. 2. Mild age-related cerebral atrophy with chronic small vessel ischemic disease. Otherwise unremarkable brain MRI for age.

## 2019-12-12 NOTE — Telephone Encounter (Signed)
[place],James Pilar Plate) has called for the results to the MRI, please call when available.

## 2020-01-22 ENCOUNTER — Encounter: Payer: Self-pay | Admitting: Adult Health

## 2020-01-22 ENCOUNTER — Ambulatory Visit (INDEPENDENT_AMBULATORY_CARE_PROVIDER_SITE_OTHER): Payer: Medicare Other | Admitting: Adult Health

## 2020-01-22 ENCOUNTER — Other Ambulatory Visit: Payer: Self-pay

## 2020-01-22 VITALS — BP 140/78 | HR 76 | Temp 97.0°F | Ht 65.0 in | Wt 175.0 lb

## 2020-01-22 DIAGNOSIS — F0390 Unspecified dementia without behavioral disturbance: Secondary | ICD-10-CM | POA: Diagnosis not present

## 2020-01-22 MED ORDER — MEMANTINE HCL 5 MG PO TABS: ORAL_TABLET | ORAL | 5 refills | Status: DC

## 2020-01-22 NOTE — Progress Notes (Signed)
PATIENT: Melissa Valenzuela DOB: 1931-05-30  REASON FOR VISIT: follow up HISTORY FROM: patient  HISTORY OF PRESENT ILLNESS: Today 01/22/20: Melissa Valenzuela is an 86 year old female with a history of memory disturbance.  She returns today for follow-up.  She feels that Melissa memory has remained stable.  She is currently not on any medication.  She is able to complete all ADLs independently.  She no longer operates a motor vehicle.  Valenzuela states that there is been no significant change in Melissa mood or behavior.  He reports that they do disagree more but states this is due to Melissa denial of having memory trouble.  She is repetitive during the office visit today.  Valenzuela also notes that children have noticed Melissa repetitiveness.  She does have a family history of Alzheimer's disease.   HISTORY (Copied from Dr.Dohmeier's note) Mr. Sandberg introduced his concerns.  His couples adult children and he have noticed that Melissa Valenzuela often asks questions repeatedly or tries to confirm the absence of a conversation that has just taken place.  Over the last year they have not had a face-to-face visit with Dr. Sharlett Iles and grew increasingly concerned.  This led to the referral to neurology.  The couple is married for over 70 years. The past medical history for this patient is positive for a herpes zoster infected neuralgia for which she was followed by Dr. Delice Lesch, she has never been a smoker she may drink 3 or 4 alcoholic beverages a week, she drinks 1 cup of coffee usually in the morning, she is not a soda drinker.  She has a history of diverticulosis, osteoporosis age-related, clinical GERD, in June 2011 she had some atypical chest pain, she had left knee osteoarthritis, varicosis of veins in both lower extremities.  In 2017 she suffered a fall with extensive head lacerations.  She was traveling to [place] and the  wound required surgical sutures.   In January 2018 she developed a right upper extremity  shingles- following the dermatome of the 6/7 cervical nerve into the right hand and had suffered postherpetic neuralgia.   Gabapentin was prescribed it led to fluid retention weight gain and to confusion.  In December 2019 chronic forgetfulness was already addressed and a Mini-Mental status examination score of 22 out of 30 points was reached which would indicate at least an early stage of dementia.  She has been followed for hip pain by Dr. Hector Shade.   REVIEW OF SYSTEMS: Out of a complete 14 system review of symptoms, the patient complains only of the following symptoms, and all other reviewed systems are negative. See HPI  ALLERGIES: Allergies  Allergen Reactions  . Moxifloxacin Rash    HOME MEDICATIONS: Outpatient Medications Prior to Visit  Medication Sig Dispense Refill  . aspirin EC 81 MG tablet Take 81 mg by mouth once.     No facility-administered medications prior to visit.    PAST MEDICAL HISTORY: Past Medical History:  Diagnosis Date  . Cataract   . Chest pain 02/16/2010   Myoview, negative for pharmacologic-stress induced ischemia, EF 74  . Complication of anesthesia 1954   hard to wake up  . Diverticulosis   . DUB (dysfunctional uterine bleeding)   . Dysrhythmia    h/o PVCs- none recently- no meds  . GERD (gastroesophageal reflux disease)   . Hypertension    no meds currently  . Loss of consciousness ([place])   . Palpitation 02/02/13   A CardioNet MCOT (mobile cardiac outpatient telemetry ):  NSR with PACs; sinus tachycardia with ventricular trigeminy. Brief atrial run. Facet cardiac; ventricular trigeminy and accelerated idioventricular rhythm. No significant tachyarrhythmias.  . Palpitations   . Postmenopausal bleeding 11/01/2014  . Varicose veins of both legs with pain     PAST SURGICAL HISTORY: Past Surgical History:  Procedure Laterality Date  . APPENDECTOMY    . CATARACT EXTRACTION     both eyes  . [place], [id number]   x 4  .  DIAGNOSTIC LAPAROSCOPY    . FRACTURE SURGERY    . HIP SURGERY Right   . HYSTEROSCOPY WITH D & C N/A 11/02/2014   Procedure: DILATATION AND CURETTAGE /HYSTEROSCOPY;  Surgeon: Janyth Contes, MD;  Location: [facility];  Service: Gynecology;  Laterality: N/A;  . Lower Extremity Venous Dopplers  May 2014   No thrombus or thrombophlebitis. R Greater Saphenous Vein - enlarged without other insufficiency (6 mm); right and left Small Saphenous Vein: Patent no valve insufficiency.  . TONSILLECTOMY    . TONSILLECTOMY AND ADENOIDECTOMY    . TRANSTHORACIC ECHOCARDIOGRAM  May 2014   Normal LV size and function. EF 55-60%. Grade 1 diastolic dysfunction. Elevated LV filling pressures. No regional W. May.    FAMILY HISTORY: Family History  Problem Relation Age of Onset  . Stroke Mother 33  . Hypertension Mother   . Colon cancer Mother 79  . Hypertension Father   . Stroke Father        2  . Dementia Father   . Hypertension Son 23       Started on BP medicine age [age]    SOCIAL HISTORY: Social History   Socioeconomic History  . Marital status: Married    Spouse name: Not on file  . Number of children: Not on file  . Years of education: Not on file  . Highest education level: Not on file  Occupational History  . Not on file  Tobacco Use  . Smoking status: Never Smoker  . Smokeless tobacco: Never Used  Substance and Sexual Activity  . Alcohol use: Yes    Alcohol/week: 7.0 standard drinks    Types: 7 Standard drinks or equivalent per week    Comment: occasionally  . Drug use: No  . Sexual activity: Not on file  Other Topics Concern  . Not on file  Social History Narrative   She and Melissa Valenzuela are relatively active, but have not been going to [employer] as much as they used to.  They have been traveling -- just returned from [place] where they did quite a bit of walking.     Social Determinants of Health   Financial Resource Strain:   . Difficulty of Paying Living  Expenses:   Food Insecurity:   . Worried About Charity fundraiser in the Last Year:   . Arboriculturist in the Last Year:   Transportation Needs:   . Film/video editor (Medical):   Marland Kitchen Lack of Transportation (Non-Medical):   Physical Activity:   . Days of Exercise per Week:   . Minutes of Exercise per Session:   Stress:   . Feeling of Stress :   Social Connections:   . Frequency of Communication with Friends and Family:   . Frequency of Social Gatherings with Friends and Family:   . Attends Religious Services:   . Active Member of Clubs or Organizations:   . Attends Archivist Meetings:   Marland Kitchen Marital Status:   Intimate Production manager  Violence:   . Fear of Current or Ex-Partner:   . Emotionally Abused:   Marland Kitchen Physically Abused:   . Sexually Abused:       PHYSICAL EXAM  Vitals:   01/22/20 1336  BP: 140/78  Pulse: 76  Temp: (!) 97 F (36.1 C)  Weight: 175 lb (79.4 kg)  Height: 5\' 5"  (1.651 m)   Body mass index is 29.12 kg/m.    MMSE - Mini Mental State Exam 01/22/2020 10/23/2019  Orientation to time 1 3  Orientation to Place 5 3  Registration 3 3  Attention/ Calculation 1 5  Attention/Calculation-comments - couldnt do numbers  Recall 0 0  Language- name 2 objects 2 2  Language- repeat 1 1  Language- follow 3 step command 3 3  Language- read & follow direction 1 1  Write a sentence 1 1  Copy design 1 1  Copy design-comments 10 animals -  Total score 19 23     Generalized: Well developed, in no acute distress   Neurological examination  Mentation: Alert oriented to time, place, history taking. Follows all commands speech and language fluent Cranial nerve II-XII: Pupils were equal round reactive to light. Extraocular movements were full, visual field were full on confrontational test. Head turning and shoulder shrug  were normal and symmetric. Motor: The motor testing reveals 5 over 5 strength of all 4 extremities. Good symmetric motor tone is noted  throughout.  Sensory: Sensory testing is intact to soft touch on all 4 extremities. No evidence of extinction is noted.  Coordination: Cerebellar testing reveals good finger-nose-finger and heel-to-shin bilaterally.  Gait and station: Gait is normal.  Reflexes: Deep tendon reflexes are symmetric and normal bilaterally.   DIAGNOSTIC DATA (LABS, IMAGING, TESTING) - I reviewed patient records, labs, notes, testing and imaging myself where available.  Lab Results  Component Value Date   WBC 7.8 11/03/2018   HGB 13.9 11/03/2018   HCT 42.4 11/03/2018   MCV 91.6 11/03/2018   PLT 253 11/03/2018      Component Value Date/Time   NA 137 11/03/2018 1342   K 4.2 11/03/2018 1342   CL 104 11/03/2018 1342   CO2 20 (L) 11/03/2018 1342   GLUCOSE 106 (H) 11/03/2018 1342   BUN 21 11/03/2018 1342   CREATININE 1.15 (H) 11/03/2018 1342   CALCIUM 10.1 11/03/2018 1342   PROT 6.6 11/03/2018 1342   ALBUMIN 3.7 11/03/2018 1342   AST 24 11/03/2018 1342   ALT 21 11/03/2018 1342   ALKPHOS 79 11/03/2018 1342   BILITOT 0.7 11/03/2018 1342   GFRNONAA 43 (L) 11/03/2018 1342   GFRAA 50 (L) 11/03/2018 1342   Lab Results  Component Value Date   CHOL  02/15/2010    184        ATP III CLASSIFICATION:  <200     mg/dL   Desirable  200-239  mg/dL   Borderline High  >=240    mg/dL   High          HDL 66 02/15/2010   LDLCALC (H) 02/15/2010    109        Total Cholesterol/HDL:CHD Risk Coronary Heart Disease Risk Table                     Men   Women  1/2 Average Risk   3.4   3.3  Average Risk       5.0   4.4  2 X Average Risk   9.6  7.1  3 X Average Risk  23.4   11.0        Use the calculated Patient Ratio above and the CHD Risk Table to determine the patient's CHD Risk.        ATP III CLASSIFICATION (LDL):  <100     mg/dL   Optimal  100-129  mg/dL   Near or Above                    Optimal  130-159  mg/dL   Borderline  160-189  mg/dL   High  >190     mg/dL   Very High   TRIG 45 02/15/2010    CHOLHDL 2.8 02/15/2010       ASSESSMENT AND PLAN 86 y.o. year old female  has a past medical history of Cataract, Chest pain ([id number]), Complication of anesthesia (1954), Diverticulosis, DUB (dysfunctional uterine bleeding), Dysrhythmia, GERD (gastroesophageal reflux disease), Hypertension, Loss of consciousness ([place]), Palpitation (02/02/13), Palpitations, Postmenopausal bleeding (11/01/2014), and Varicose veins of both legs with pain. here with :  1.  Dementia without behavioral issues  -MMSE 19/30 previously 23/30 -We will start Namenda 5 mg nightly for 1 week then increasing to 5 mg twice a day -Reviewed medication and side effects with the patient and Melissa Valenzuela -Advised the Valenzuela to call in 2 to 3 weeks if she is tolerating medication well we will continue increasing -Advised if symptoms worsen or she develops new symptoms she should let us know -Follow-up in 6 months or sooner if needed   I spent 30 minutes of face-to-face and non-face-to-face time with patient.  This included previsit chart review, lab review, study review, order entry, electronic health record documentation, patient education.  Ward Givens, MSN, NP-C 01/22/2020, 1:51 PM [facility] [address] [facility] [place], [place] [address] [phone]

## 2020-01-22 NOTE — Patient Instructions (Signed)
Your Plan:  Start Namenda 5 mg:  Take 1 tablet at bedtime for 1 week then increase to twice a day thereafter Call in 2-3 weeks and we can further increase medication  If your symptoms worsen or you develop new symptoms please let us know.    Thank you for coming to see [place] at [facility]. I hope we have been able to provide you high quality care today.  You may receive a patient satisfaction survey over the next few weeks. We would appreciate your feedback and comments so that we may continue to improve ourselves and the health of our patients.  Memantine Tablets What is this medicine? MEMANTINE (MEM an teen) is used to treat dementia caused by Alzheimer's disease. This medicine may be used for other purposes; ask your health care provider or pharmacist if you have questions. COMMON BRAND NAME(S): Namenda What should I tell my health care provider before I take this medicine? They need to know if you have any of these conditions:  difficulty passing urine  kidney disease  liver disease  seizures  an unusual or allergic reaction to memantine, other medicines, foods, dyes, or preservatives  pregnant or trying to get pregnant  breast-feeding How should I use this medicine? Take this medicine by mouth with a glass of water. Follow the directions on the prescription label. You may take this medicine with or without food. Take your doses at regular intervals. Do not take your medicine more often than directed. Continue to take your medicine even if you feel better. Do not stop taking except on the advice of your doctor or health care professional. Talk to your pediatrician regarding the use of this medicine in children. Special care may be needed. Overdosage: If you think you have taken too much of this medicine contact a poison control center or emergency room at once. NOTE: This medicine is only for you. Do not share this medicine with others. What if I miss a  dose? If you miss a dose, take it as soon as you can. If it is almost time for your next dose, take only that dose. Do not take double or extra doses. If you do not take your medicine for several days, contact your health care provider. Your dose may need to be changed. What may interact with this medicine?  acetazolamide  amantadine  cimetidine  dextromethorphan  dofetilide  hydrochlorothiazide  ketamine  metformin  methazolamide  quinidine  ranitidine  sodium bicarbonate  triamterene This list may not describe all possible interactions. Give your health care provider a list of all the medicines, herbs, non-prescription drugs, or dietary supplements you use. Also tell them if you smoke, drink alcohol, or use illegal drugs. Some items may interact with your medicine. What should I watch for while using this medicine? Visit your doctor or health care professional for regular checks on your progress. Check with your doctor or health care professional if there is no improvement in your symptoms or if they get worse. You may get drowsy or dizzy. Do not drive, use machinery, or do anything that needs mental alertness until you know how this drug affects you. Do not stand or sit up quickly, especially if you are an older patient. This reduces the risk of dizzy or fainting spells. Alcohol can make you more drowsy and dizzy. Avoid alcoholic drinks. What side effects may I notice from receiving this medicine? Side effects that you should report to your doctor or health care  professional as soon as possible:  allergic reactions like skin rash, itching or hives, swelling of the face, lips, or tongue  agitation or a feeling of restlessness  depressed mood  dizziness  hallucinations  redness, blistering, peeling or loosening of the skin, including inside the mouth  seizures  vomiting Side effects that usually do not require medical attention (report to your doctor or health care  professional if they continue or are bothersome):  constipation  diarrhea  headache  nausea  trouble sleeping This list may not describe all possible side effects. Call your doctor for medical advice about side effects. You may report side effects to FDA at 1-800-FDA-1088. Where should I keep my medicine? Keep out of the reach of children. Store at room temperature between 15 degrees and 30 degrees C (59 degrees and 86 degrees F). Throw away any unused medicine after the expiration date. NOTE: This sheet is a summary. It may not cover all possible information. If you have questions about this medicine, talk to your doctor, pharmacist, or health care provider.  2020 Elsevier/Gold Standard (2013-06-19 14:10:42)

## 2020-05-15 DIAGNOSIS — R300 Dysuria: Secondary | ICD-10-CM | POA: Diagnosis not present

## 2020-06-03 IMAGING — MR MR HEAD WO/W CM
12 of 13 series · 43 of 48 positions shown · IV contrast (multihance)
Comparison: None available.

CLINICAL DATA: Initial evaluation for neuro cognitive decline,
history of subacute head trauma.

EXAM:
MRI HEAD WITHOUT AND WITH CONTRAST
TECHNIQUE: Multiplanar, multiecho pulse sequences of the brain and surrounding
structures were obtained without and with intravenous contrast.
CONTRAST:  16mL MULTIHANCE GADOBENATE DIMEGLUMINE 529 MG/ML IV SOLN

[Series 2: T1 · sagittal · 5.0mm · 0.45mm/px · 1 of 21 slices shown]
[im 1/21]
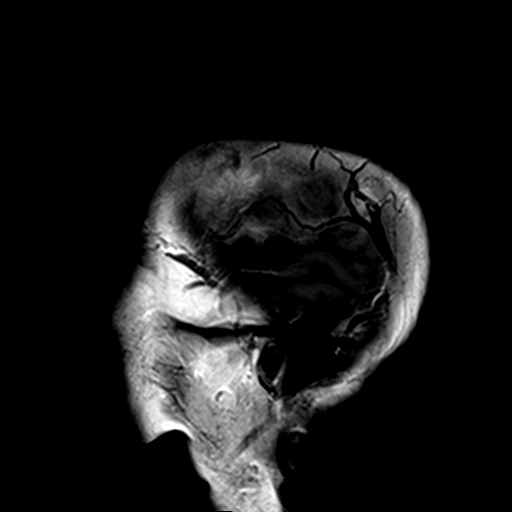

[Series 3: DWI · axial · 3.0mm · 1.80mm/px · z∈[-44,+111]mm · 6 of 105 slices shown (1 of 4)]
[im 1/105]
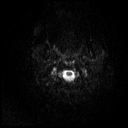
[im 21/105]
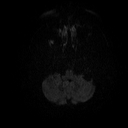
[im 42/105]
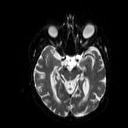
[im 63/105]
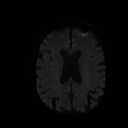
[im 84/105]
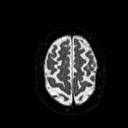
[im 105/105]
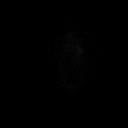

[Series 4: DWI · axial · 3.0mm · 1.80mm/px · z∈[-44,+111]mm · 3 of 52 slices shown (2 of 4)]
[im 1/52]
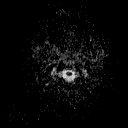
[im 26/52]
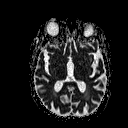
[im 52/52]
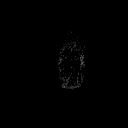

[Series 5: mip_images(sw) · axial · 16.0mm · 0.90mm/px · z∈[-37,+106]mm · 4 of 73 slices shown]
[im 1/73]
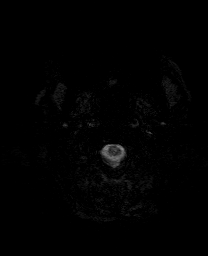
[im 25/73]
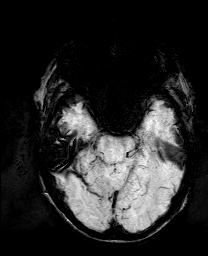
[im 49/73]
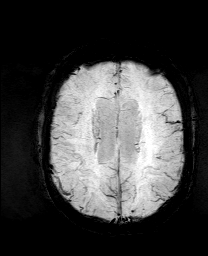
[im 73/73]
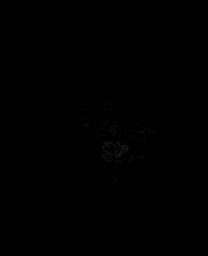

[Series 6: swi_images · axial · 2.0mm · 0.90mm/px · z∈[-44,+113]mm · 4 of 80 slices shown]
[im 1/80]
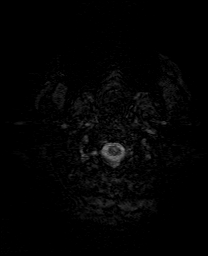
[im 27/80]
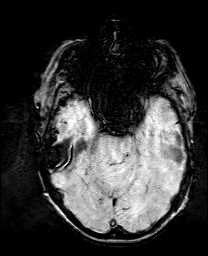
[im 53/80]
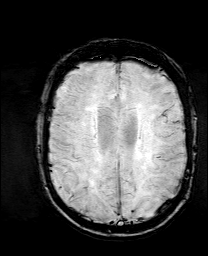
[im 80/80]
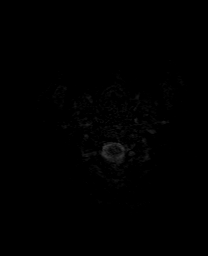

[Series 7: DWI · coronal · 5.0mm · 1.80mm/px · 4 of 66 slices shown (3 of 4)]
[im 1/66]
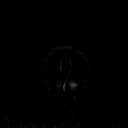
[im 22/66]
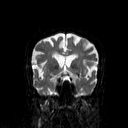
[im 44/66]
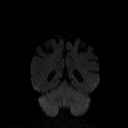
[im 66/66]
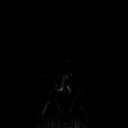

[Series 8: DWI · coronal · 5.0mm · 1.80mm/px · 2 of 34 slices shown (4 of 4)]
[im 1/34]
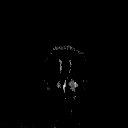
[im 34/34]
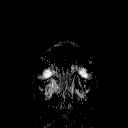

[Series 9: T2 · axial · 5.0mm · 0.60mm/px · z∈[-53,+122]mm · 2 of 27 slices shown (1 of 2)]
[im 1/27]
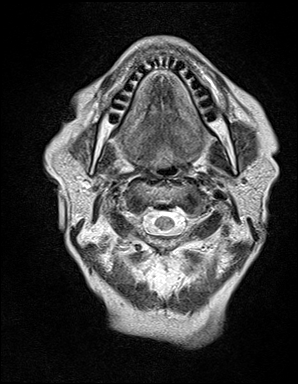
[im 27/27]
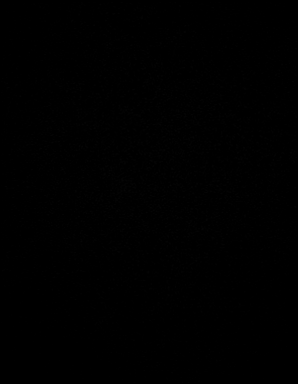

[Series 10: FLAIR · axial · 3.0mm · 0.45mm/px · z∈[-44,+111]mm · 2 of 27 slices shown]
[im 1/27]
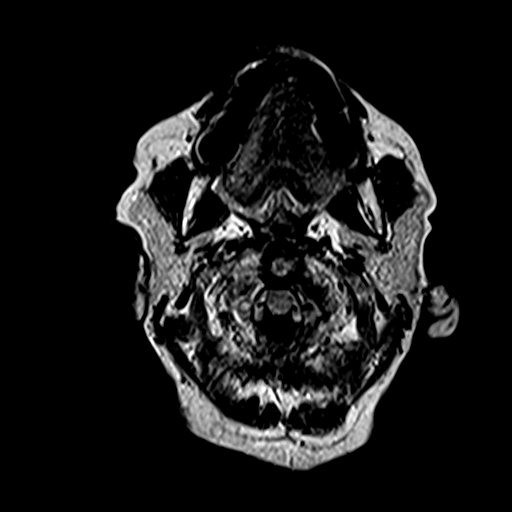
[im 27/27]
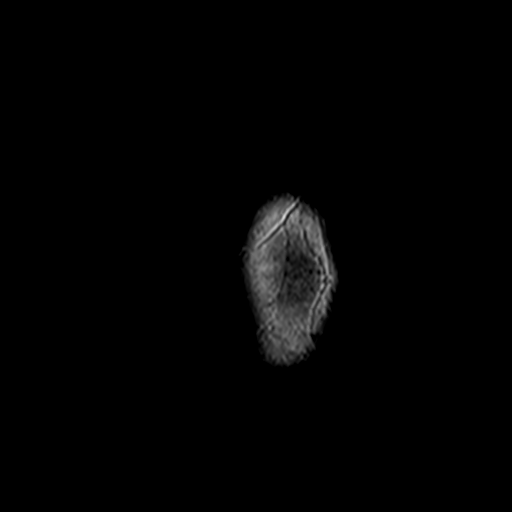

[Series 11: t1_mpr_tra copy center · axial · 1.0mm · 0.45mm/px · z∈[-45,+114]mm · 8 of 160 slices shown]
[im 1/160]
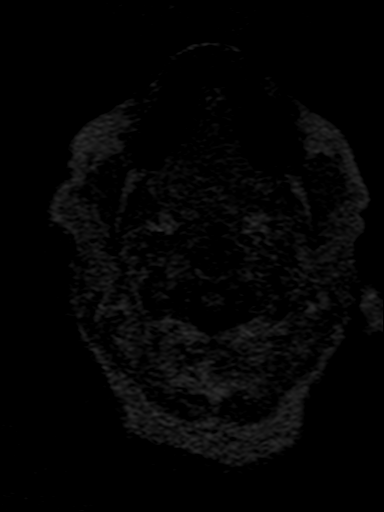
[im 20/160]
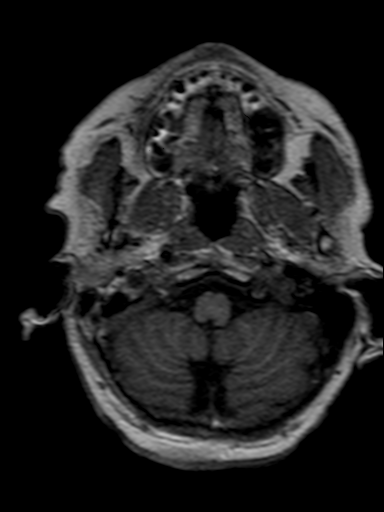
[im 40/160]
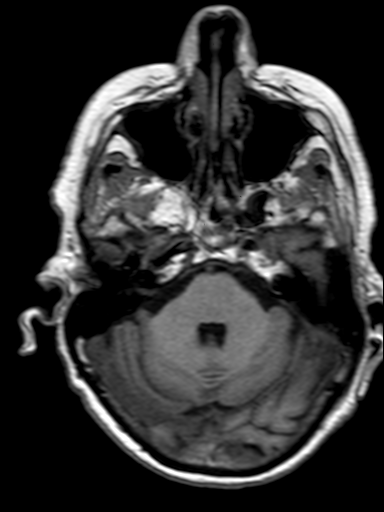
[im 60/160]
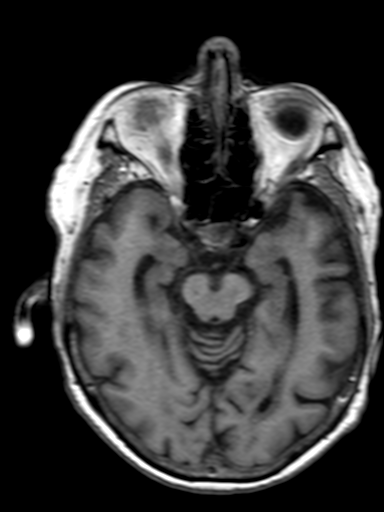
[im 100/160]
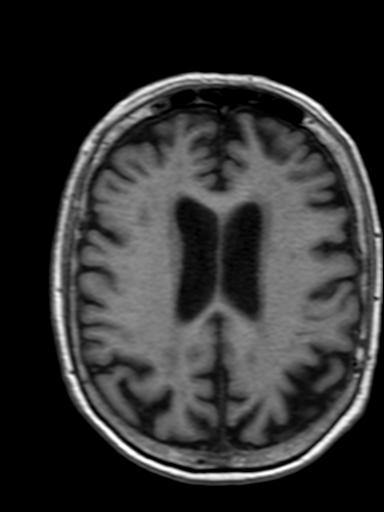
[im 120/160]
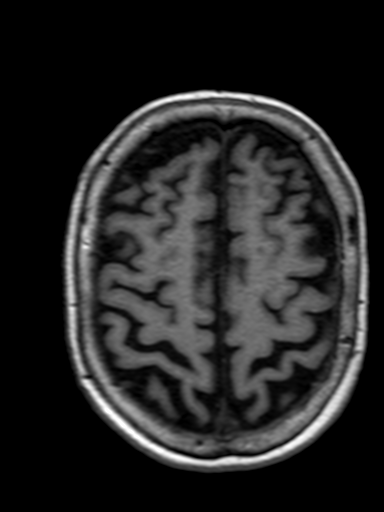
[im 140/160]
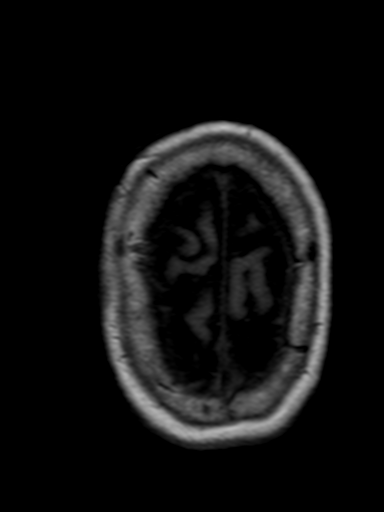
[im 160/160]
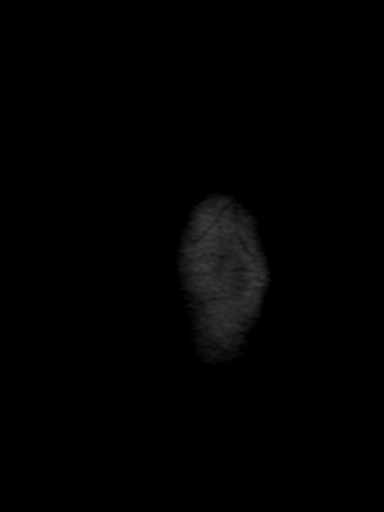

[Series 12: T2 · coronal · 5.0mm · 0.45mm/px · 1 of 26 slices shown (2 of 2)]
[im 1/26]
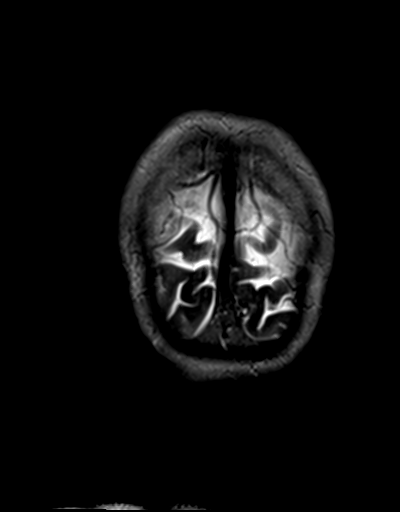

[Series 13: t1_mpr_tra · axial · 1.0mm · 0.45mm/px · z∈[-45,+74]mm · 6 of 160 slices shown]
[im 1/160]
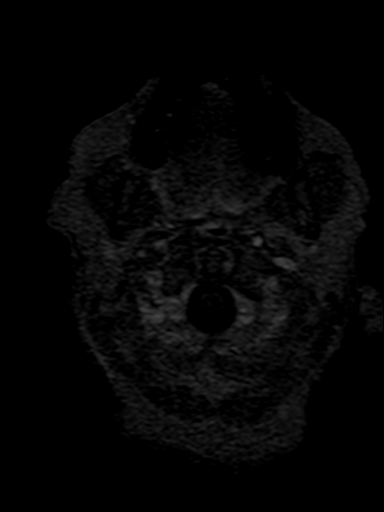
[im 20/160]
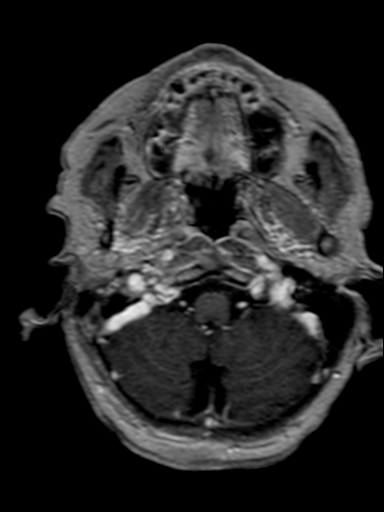
[im 40/160]
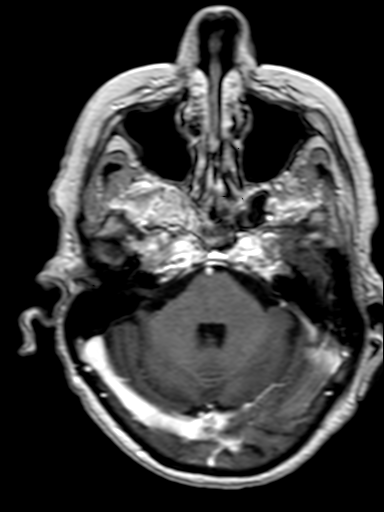
[im 60/160]
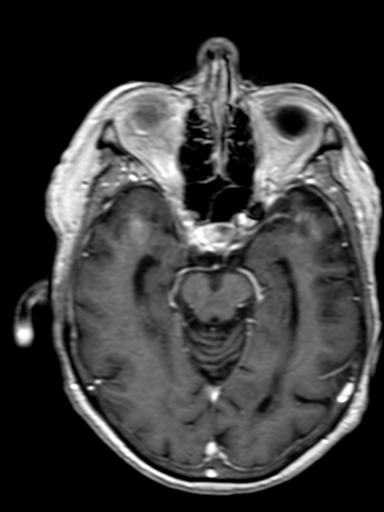
[im 100/160]
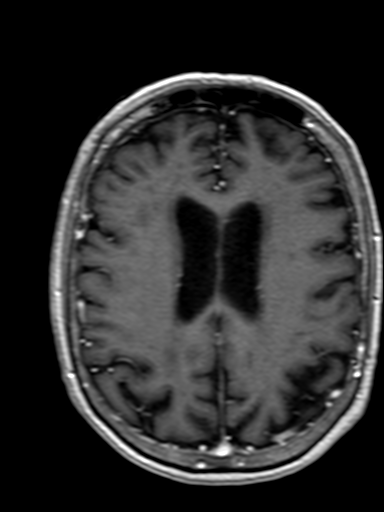
[im 120/160]
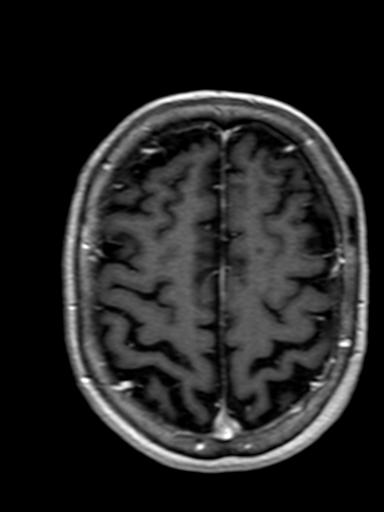

[43 of 48 positions shown; findings below may reference images not displayed]

FINDINGS: Brain: Diffuse prominence of the CSF containing spaces compatible
with generalized age-related cerebral atrophy. Patchy T2/FLAIR
hyperintensity within the periventricular deep white matter both
cerebral hemispheres most consistent with chronic small vessel
ischemic disease, mild for age.

No abnormal foci of restricted diffusion to suggest acute or
subacute ischemia. Gray-white matter differentiation maintained. No
encephalomalacia to suggest chronic cortical infarction. No foci of
susceptibility artifact to suggest acute or chronic intracranial
hemorrhage.

No mass lesion, midline shift or mass effect. Ventricles normal size
without hydrocephalus. No extra-axial fluid collection. Pituitary
gland suprasellar region normal. Midline structures intact.

No abnormal enhancement following contrast administration.

Vascular: Major intracranial vascular flow voids are maintained.

Skull and upper cervical spine: Craniocervical junction within
normal limits. Visualized upper cervical spine normal. Bone marrow
signal intensity within normal limits. No scalp soft tissue
abnormality.

Sinuses/Orbits: Patient status post bilateral ocular lens
replacement. Globes and orbital soft tissues demonstrate no acute
finding. Scattered mucosal thickening noted within the ethmoidal air
cells and maxillary sinuses. Paranasal sinuses are otherwise clear.
Trace bilateral mastoid effusions, of doubtful significance. Inner
ear structures grossly normal.

Other: None.
IMPRESSION: 1. No acute intracranial abnormality.
2. Mild age-related cerebral atrophy with chronic small vessel
ischemic disease. Otherwise unremarkable brain MRI for age.

## 2020-06-15 DIAGNOSIS — Z23 Encounter for immunization: Secondary | ICD-10-CM | POA: Diagnosis not present

## 2020-06-20 DIAGNOSIS — Z23 Encounter for immunization: Secondary | ICD-10-CM | POA: Diagnosis not present

## 2020-07-25 ENCOUNTER — Ambulatory Visit (INDEPENDENT_AMBULATORY_CARE_PROVIDER_SITE_OTHER): Payer: Medicare Other | Admitting: Adult Health

## 2020-07-25 ENCOUNTER — Encounter: Payer: Self-pay | Admitting: Adult Health

## 2020-07-25 VITALS — BP 128/66 | HR 84 | Ht 65.0 in | Wt 175.0 lb

## 2020-07-25 DIAGNOSIS — F0390 Unspecified dementia without behavioral disturbance: Secondary | ICD-10-CM | POA: Diagnosis not present

## 2020-07-25 NOTE — Patient Instructions (Signed)
Your Plan:  Continue Namenda 10 mg twice a day Memory score is stable If your symptoms worsen or you develop new symptoms please let us know.   Thank you for coming to see us at [facility]. I hope we have been able to provide you high quality care today.  You may receive a patient satisfaction survey over the next few weeks. We would appreciate your feedback and comments so that we may continue to improve ourselves and the health of our patients.  

## 2020-07-25 NOTE — Progress Notes (Signed)
PATIENT: Melissa Valenzuela DOB: 1931-07-31  REASON FOR VISIT: follow up HISTORY FROM: patient  HISTORY OF PRESENT ILLNESS: Today 07/25/20: Melissa Valenzuela is an 86 year old female with a history of memory disturbance.  She returns today for follow-up.  She is here today with her husband.  Overall she feels that things have remained stable.  She is able to complete all ADLs independently.  Her husband does all the driving.  Her husband helps her with her medications and appointments.  He is now managing all the finances.  She denies any trouble with her sleep.  No change in her mood or behavior.  She returns today for an evaluation.   HISTORY 01/22/20: Melissa Valenzuela is an 86 year old female with a history of memory disturbance.  She returns today for follow-up.  She feels that her memory has remained stable.  She is currently not on any medication.  She is able to complete all ADLs independently.  She no longer operates a motor vehicle.  Husband states that there is been no significant change in her mood or behavior.  He reports that they do disagree more but states this is due to her denial of having memory trouble.  She is repetitive during the office visit today.  Husband also notes that children have noticed her repetitiveness.  She does have a family history of Alzheimer's disease.  REVIEW OF SYSTEMS: Out of a complete 14 system review of symptoms, the patient complains only of the following symptoms, and all other reviewed systems are negative.  See HPI  ALLERGIES: Allergies  Allergen Reactions  . Moxifloxacin Rash    HOME MEDICATIONS: Outpatient Medications Prior to Visit  Medication Sig Dispense Refill  . aspirin EC 81 MG tablet Take 81 mg by mouth once.    . memantine (NAMENDA) 5 MG tablet Take 1 tablet at bedtime daily for 1 week, then increase to twice a day thereafter 60 tablet 5   No facility-administered medications prior to visit.    PAST MEDICAL HISTORY: Past  Medical History:  Diagnosis Date  . Cataract   . Chest pain 02/16/2010   Myoview, negative for pharmacologic-stress induced ischemia, EF 74  . Complication of anesthesia 1954   hard to wake up  . Diverticulosis   . DUB (dysfunctional uterine bleeding)   . Dysrhythmia    h/o PVCs- none recently- no meds  . GERD (gastroesophageal reflux disease)   . Hypertension    no meds currently  . Loss of consciousness ([place])   . Palpitation 02/02/13   A CardioNet MCOT (mobile cardiac outpatient telemetry ): NSR with PACs; sinus tachycardia with ventricular trigeminy. Brief atrial run. Facet cardiac; ventricular trigeminy and accelerated idioventricular rhythm. No significant tachyarrhythmias.  . Palpitations   . Postmenopausal bleeding 11/01/2014  . Varicose veins of both legs with pain     PAST SURGICAL HISTORY: Past Surgical History:  Procedure Laterality Date  . APPENDECTOMY    . CATARACT EXTRACTION     both eyes  . [place], [id number]   x 4  . DIAGNOSTIC LAPAROSCOPY    . FRACTURE SURGERY    . HIP SURGERY Right   . HYSTEROSCOPY WITH D & C N/A 11/02/2014   Procedure: DILATATION AND CURETTAGE /HYSTEROSCOPY;  Surgeon: Janyth Contes, MD;  Location: [facility];  Service: Gynecology;  Laterality: N/A;  . Lower Extremity Venous Dopplers  May 2014   No thrombus or thrombophlebitis. R Greater Saphenous Vein - enlarged without other insufficiency (6 mm);  right and left Small Saphenous Vein: Patent no valve insufficiency.  . TONSILLECTOMY    . TONSILLECTOMY AND ADENOIDECTOMY    . TRANSTHORACIC ECHOCARDIOGRAM  May 2014   Normal LV size and function. EF 55-60%. Grade 1 diastolic dysfunction. Elevated LV filling pressures. No regional W. May.    FAMILY HISTORY: Family History  Problem Relation Age of Onset  . Stroke Mother 22  . Hypertension Mother   . Colon cancer Mother 45  . Hypertension Father   . Stroke Father        2  . Dementia Father   . Hypertension Son 105        Started on BP medicine age 5/24    SOCIAL HISTORY: Social History   Socioeconomic History  . Marital status: Married    Spouse name: Not on file  . Number of children: Not on file  . Years of education: Not on file  . Highest education level: Not on file  Occupational History  . Not on file  Tobacco Use  . Smoking status: Never Smoker  . Smokeless tobacco: Never Used  Vaping Use  . Vaping Use: Never used  Substance and Sexual Activity  . Alcohol use: Yes    Alcohol/week: 7.0 standard drinks    Types: 7 Standard drinks or equivalent per week    Comment: occasionally  . Drug use: No  . Sexual activity: Not on file  Other Topics Concern  . Not on file  Social History Narrative   She and her husband are relatively active, but have not been going to [employer] as much as they used to.  They have been traveling -- just returned from [place] where they did quite a bit of walking.     Social Determinants of Health   Financial Resource Strain:   . Difficulty of Paying Living Expenses: Not on file  Food Insecurity:   . Worried About Charity fundraiser in the Last Year: Not on file  . Ran Out of Food in the Last Year: Not on file  Transportation Needs:   . Lack of Transportation (Medical): Not on file  . Lack of Transportation (Non-Medical): Not on file  Physical Activity:   . Days of Exercise per Week: Not on file  . Minutes of Exercise per Session: Not on file  Stress:   . Feeling of Stress : Not on file  Social Connections:   . Frequency of Communication with Friends and Family: Not on file  . Frequency of Social Gatherings with Friends and Family: Not on file  . Attends Religious Services: Not on file  . Active Member of Clubs or Organizations: Not on file  . Attends Archivist Meetings: Not on file  . Marital Status: Not on file  Intimate Partner Violence:   . Fear of Current or Ex-Partner: Not on file  . Emotionally Abused: Not on file  .  Physically Abused: Not on file  . Sexually Abused: Not on file      PHYSICAL EXAM  Vitals:   07/25/20 1321  BP: 128/66  Pulse: 84  Weight: 175 lb (79.4 kg)  Height: 5\' 5"  (1.651 m)   Body mass index is 29.12 kg/m.   MMSE - Mini Mental State Exam 07/25/2020 01/22/2020 10/23/2019  Orientation to time 2 1 3   Orientation to Place 5 5 3   Registration 3 3 3   Attention/ Calculation 3 1 5   Attention/Calculation-comments - - couldnt do numbers  Recall 0 0  0  Language- name 2 objects 2 2 2   Language- repeat 1 1 1   Language- follow 3 step command 3 3 3   Language- read & follow direction 1 1 1   Write a sentence 1 1 1   Copy design 1 1 1   Copy design-comments - 10 animals -  Total score 22 19 23      Generalized: Well developed, in no acute distress   Neurological examination  Mentation: Alert oriented to time, place, history taking. Follows all commands speech and language fluent Cranial nerve II-XII: Pupils were equal round reactive to light. Extraocular movements were full, visual field were full on confrontational test.  Head turning and shoulder shrug  were normal and symmetric. Motor: The motor testing reveals 5 over 5 strength of all 4 extremities. Good symmetric motor tone is noted throughout.  Sensory: Sensory testing is intact to soft touch on all 4 extremities. No evidence of extinction is noted.  Coordination: Cerebellar testing reveals good finger-nose-finger and heel-to-shin bilaterally.  Gait and station: Gait is normal.  Reflexes: Deep tendon reflexes are symmetric and normal bilaterally.   DIAGNOSTIC DATA (LABS, IMAGING, TESTING) - I reviewed patient records, labs, notes, testing and imaging myself where available.  Lab Results  Component Value Date   WBC 7.8 11/03/2018   HGB 13.9 11/03/2018   HCT 42.4 11/03/2018   MCV 91.6 11/03/2018   PLT 253 11/03/2018      Component Value Date/Time   NA 137 11/03/2018 1342   K 4.2 11/03/2018 1342   CL 104 11/03/2018  1342   CO2 20 (L) 11/03/2018 1342   GLUCOSE 106 (H) 11/03/2018 1342   BUN 21 11/03/2018 1342   CREATININE 1.15 (H) 11/03/2018 1342   CALCIUM 10.1 11/03/2018 1342   PROT 6.6 11/03/2018 1342   ALBUMIN 3.7 11/03/2018 1342   AST 24 11/03/2018 1342   ALT 21 11/03/2018 1342   ALKPHOS 79 11/03/2018 1342   BILITOT 0.7 11/03/2018 1342   GFRNONAA 43 (L) 11/03/2018 1342   GFRAA 50 (L) 11/03/2018 1342   Lab Results  Component Value Date   CHOL  02/15/2010    184        ATP III CLASSIFICATION:  <200     mg/dL   Desirable  200-239  mg/dL   Borderline High  >=240    mg/dL   High          HDL 66 02/15/2010   LDLCALC (H) 02/15/2010    109        Total Cholesterol/HDL:CHD Risk Coronary Heart Disease Risk Table                     Men   Women  1/2 Average Risk   3.4   3.3  Average Risk       5.0   4.4  2 X Average Risk   9.6   7.1  3 X Average Risk  23.4   11.0        Use the calculated Patient Ratio above and the CHD Risk Table to determine the patient's CHD Risk.        ATP III CLASSIFICATION (LDL):  <100     mg/dL   Optimal  100-129  mg/dL   Near or Above                    Optimal  130-159  mg/dL   Borderline  160-189  mg/dL   High  >190  mg/dL   Very High   TRIG 45 02/15/2010   CHOLHDL 2.8 02/15/2010       ASSESSMENT AND PLAN 86 y.o. year old female  has a past medical history of Cataract, Chest pain (12/17/3644), Complication of anesthesia (1954), Diverticulosis, DUB (dysfunctional uterine bleeding), Dysrhythmia, GERD (gastroesophageal reflux disease), Hypertension, Loss of consciousness ([place]), Palpitation (02/02/13), Palpitations, Postmenopausal bleeding (11/01/2014), and Varicose veins of both legs with pain. here with:  1. Memory disturbance  -Memory score stable MMSE 22 out of 30 previously 19 out of 30 -Continue Namenda 10 mg twice a day -Advised if symptoms worsen or she develops new symptoms they should let us know -Follow-up in 6 months or sooner if needed  I  spent 25  minutes of face-to-face and non-face-to-face time with patient.  This included previsit chart review, lab review, study review, order entry, electronic health record documentation, patient education.  Ward Givens, MSN, NP-C 07/25/2020, 9:25 AM [facility] [address] [place], [place] [address] [phone]

## 2020-08-12 ENCOUNTER — Other Ambulatory Visit: Payer: Self-pay | Admitting: Adult Health

## 2020-08-27 DIAGNOSIS — E785 Hyperlipidemia, unspecified: Secondary | ICD-10-CM | POA: Diagnosis not present

## 2020-08-27 DIAGNOSIS — M810 Age-related osteoporosis without current pathological fracture: Secondary | ICD-10-CM | POA: Diagnosis not present

## 2020-08-27 LAB — LIPID PANEL
Cholesterol: 156 (ref 0–200)
HDL: 69 (ref 35–70)
LDL Cholesterol: 75
LDl/HDL Ratio: 1.1
Triglycerides: 62 (ref 40–160)

## 2020-08-27 LAB — CBC AND DIFFERENTIAL
HCT: 46 (ref 36–46)
Hemoglobin: 15 (ref 12.0–16.0)
Neutrophils Absolute: 8.6
Platelets: 279 (ref 150–399)
WBC: 10.9

## 2020-08-27 LAB — BASIC METABOLIC PANEL
BUN: 14 (ref 4–21)
CO2: 23 — AB (ref 13–22)
Chloride: 104 (ref 99–108)
Creatinine: 0.8 (ref 0.5–1.1)
Glucose: 90
Potassium: 4.5 (ref 3.4–5.3)
Sodium: 139 (ref 137–147)

## 2020-08-27 LAB — HEPATIC FUNCTION PANEL
ALT: 15 (ref 7–35)
AST: 18 (ref 13–35)
Alkaline Phosphatase: 106 (ref 25–125)
Bilirubin, Total: 0.6

## 2020-08-27 LAB — VITAMIN D 25 HYDROXY (VIT D DEFICIENCY, FRACTURES): Vit D, 25-Hydroxy: 50.9

## 2020-08-27 LAB — COMPREHENSIVE METABOLIC PANEL
Albumin: 3.9 (ref 3.5–5.0)
Calcium: 9.8 (ref 8.7–10.7)
GFR calc Af Amer: 82.1
GFR calc non Af Amer: 67.9

## 2020-09-03 DIAGNOSIS — R82998 Other abnormal findings in urine: Secondary | ICD-10-CM | POA: Diagnosis not present

## 2020-09-03 DIAGNOSIS — M25552 Pain in left hip: Secondary | ICD-10-CM | POA: Diagnosis not present

## 2020-09-03 DIAGNOSIS — I10 Essential (primary) hypertension: Secondary | ICD-10-CM | POA: Diagnosis not present

## 2020-09-03 DIAGNOSIS — F0390 Unspecified dementia without behavioral disturbance: Secondary | ICD-10-CM | POA: Diagnosis not present

## 2020-09-03 DIAGNOSIS — K219 Gastro-esophageal reflux disease without esophagitis: Secondary | ICD-10-CM | POA: Diagnosis not present

## 2020-09-03 LAB — MICROALBUMIN, URINE: Microalb, Ur: 26

## 2020-09-27 DIAGNOSIS — M1612 Unilateral primary osteoarthritis, left hip: Secondary | ICD-10-CM | POA: Diagnosis not present

## 2020-09-27 DIAGNOSIS — R4789 Other speech disturbances: Secondary | ICD-10-CM | POA: Diagnosis not present

## 2020-09-27 DIAGNOSIS — F0390 Unspecified dementia without behavioral disturbance: Secondary | ICD-10-CM | POA: Diagnosis not present

## 2020-09-27 DIAGNOSIS — R2681 Unsteadiness on feet: Secondary | ICD-10-CM | POA: Diagnosis not present

## 2020-09-27 DIAGNOSIS — G309 Alzheimer's disease, unspecified: Secondary | ICD-10-CM | POA: Diagnosis not present

## 2020-09-27 DIAGNOSIS — M6281 Muscle weakness (generalized): Secondary | ICD-10-CM | POA: Diagnosis not present

## 2020-09-30 DIAGNOSIS — G309 Alzheimer's disease, unspecified: Secondary | ICD-10-CM | POA: Diagnosis not present

## 2020-09-30 DIAGNOSIS — R4789 Other speech disturbances: Secondary | ICD-10-CM | POA: Diagnosis not present

## 2020-09-30 DIAGNOSIS — M1612 Unilateral primary osteoarthritis, left hip: Secondary | ICD-10-CM | POA: Diagnosis not present

## 2020-09-30 DIAGNOSIS — R2681 Unsteadiness on feet: Secondary | ICD-10-CM | POA: Diagnosis not present

## 2020-09-30 DIAGNOSIS — F0390 Unspecified dementia without behavioral disturbance: Secondary | ICD-10-CM | POA: Diagnosis not present

## 2020-09-30 DIAGNOSIS — M6281 Muscle weakness (generalized): Secondary | ICD-10-CM | POA: Diagnosis not present

## 2020-10-02 DIAGNOSIS — R4789 Other speech disturbances: Secondary | ICD-10-CM | POA: Diagnosis not present

## 2020-10-02 DIAGNOSIS — F0390 Unspecified dementia without behavioral disturbance: Secondary | ICD-10-CM | POA: Diagnosis not present

## 2020-10-02 DIAGNOSIS — R2681 Unsteadiness on feet: Secondary | ICD-10-CM | POA: Diagnosis not present

## 2020-10-02 DIAGNOSIS — M6281 Muscle weakness (generalized): Secondary | ICD-10-CM | POA: Diagnosis not present

## 2020-10-02 DIAGNOSIS — G309 Alzheimer's disease, unspecified: Secondary | ICD-10-CM | POA: Diagnosis not present

## 2020-10-02 DIAGNOSIS — M1612 Unilateral primary osteoarthritis, left hip: Secondary | ICD-10-CM | POA: Diagnosis not present

## 2020-10-03 DIAGNOSIS — R4789 Other speech disturbances: Secondary | ICD-10-CM | POA: Diagnosis not present

## 2020-10-03 DIAGNOSIS — R2681 Unsteadiness on feet: Secondary | ICD-10-CM | POA: Diagnosis not present

## 2020-10-03 DIAGNOSIS — G309 Alzheimer's disease, unspecified: Secondary | ICD-10-CM | POA: Diagnosis not present

## 2020-10-03 DIAGNOSIS — F0390 Unspecified dementia without behavioral disturbance: Secondary | ICD-10-CM | POA: Diagnosis not present

## 2020-10-03 DIAGNOSIS — M1612 Unilateral primary osteoarthritis, left hip: Secondary | ICD-10-CM | POA: Diagnosis not present

## 2020-10-03 DIAGNOSIS — M6281 Muscle weakness (generalized): Secondary | ICD-10-CM | POA: Diagnosis not present

## 2020-10-04 DIAGNOSIS — M1612 Unilateral primary osteoarthritis, left hip: Secondary | ICD-10-CM | POA: Diagnosis not present

## 2020-10-04 DIAGNOSIS — R4789 Other speech disturbances: Secondary | ICD-10-CM | POA: Diagnosis not present

## 2020-10-04 DIAGNOSIS — R2681 Unsteadiness on feet: Secondary | ICD-10-CM | POA: Diagnosis not present

## 2020-10-04 DIAGNOSIS — M6281 Muscle weakness (generalized): Secondary | ICD-10-CM | POA: Diagnosis not present

## 2020-10-04 DIAGNOSIS — F0390 Unspecified dementia without behavioral disturbance: Secondary | ICD-10-CM | POA: Diagnosis not present

## 2020-10-04 DIAGNOSIS — G309 Alzheimer's disease, unspecified: Secondary | ICD-10-CM | POA: Diagnosis not present

## 2020-10-06 DIAGNOSIS — F0390 Unspecified dementia without behavioral disturbance: Secondary | ICD-10-CM | POA: Diagnosis not present

## 2020-10-06 DIAGNOSIS — M1612 Unilateral primary osteoarthritis, left hip: Secondary | ICD-10-CM | POA: Diagnosis not present

## 2020-10-06 DIAGNOSIS — R4789 Other speech disturbances: Secondary | ICD-10-CM | POA: Diagnosis not present

## 2020-10-06 DIAGNOSIS — R2681 Unsteadiness on feet: Secondary | ICD-10-CM | POA: Diagnosis not present

## 2020-10-06 DIAGNOSIS — M6281 Muscle weakness (generalized): Secondary | ICD-10-CM | POA: Diagnosis not present

## 2020-10-06 DIAGNOSIS — G309 Alzheimer's disease, unspecified: Secondary | ICD-10-CM | POA: Diagnosis not present

## 2020-10-07 DIAGNOSIS — M6281 Muscle weakness (generalized): Secondary | ICD-10-CM | POA: Diagnosis not present

## 2020-10-07 DIAGNOSIS — R4789 Other speech disturbances: Secondary | ICD-10-CM | POA: Diagnosis not present

## 2020-10-07 DIAGNOSIS — F0390 Unspecified dementia without behavioral disturbance: Secondary | ICD-10-CM | POA: Diagnosis not present

## 2020-10-07 DIAGNOSIS — R2681 Unsteadiness on feet: Secondary | ICD-10-CM | POA: Diagnosis not present

## 2020-10-07 DIAGNOSIS — M1612 Unilateral primary osteoarthritis, left hip: Secondary | ICD-10-CM | POA: Diagnosis not present

## 2020-10-07 DIAGNOSIS — G309 Alzheimer's disease, unspecified: Secondary | ICD-10-CM | POA: Diagnosis not present

## 2020-10-09 DIAGNOSIS — G309 Alzheimer's disease, unspecified: Secondary | ICD-10-CM | POA: Diagnosis not present

## 2020-10-09 DIAGNOSIS — R2681 Unsteadiness on feet: Secondary | ICD-10-CM | POA: Diagnosis not present

## 2020-10-09 DIAGNOSIS — R4789 Other speech disturbances: Secondary | ICD-10-CM | POA: Diagnosis not present

## 2020-10-09 DIAGNOSIS — M1612 Unilateral primary osteoarthritis, left hip: Secondary | ICD-10-CM | POA: Diagnosis not present

## 2020-10-09 DIAGNOSIS — M6281 Muscle weakness (generalized): Secondary | ICD-10-CM | POA: Diagnosis not present

## 2020-10-09 DIAGNOSIS — F0390 Unspecified dementia without behavioral disturbance: Secondary | ICD-10-CM | POA: Diagnosis not present

## 2020-10-10 DIAGNOSIS — R2681 Unsteadiness on feet: Secondary | ICD-10-CM | POA: Diagnosis not present

## 2020-10-10 DIAGNOSIS — M6281 Muscle weakness (generalized): Secondary | ICD-10-CM | POA: Diagnosis not present

## 2020-10-10 DIAGNOSIS — M1612 Unilateral primary osteoarthritis, left hip: Secondary | ICD-10-CM | POA: Diagnosis not present

## 2020-10-10 DIAGNOSIS — G309 Alzheimer's disease, unspecified: Secondary | ICD-10-CM | POA: Diagnosis not present

## 2020-10-10 DIAGNOSIS — F0390 Unspecified dementia without behavioral disturbance: Secondary | ICD-10-CM | POA: Diagnosis not present

## 2020-10-10 DIAGNOSIS — R4789 Other speech disturbances: Secondary | ICD-10-CM | POA: Diagnosis not present

## 2020-10-11 DIAGNOSIS — R2681 Unsteadiness on feet: Secondary | ICD-10-CM | POA: Diagnosis not present

## 2020-10-11 DIAGNOSIS — G309 Alzheimer's disease, unspecified: Secondary | ICD-10-CM | POA: Diagnosis not present

## 2020-10-11 DIAGNOSIS — M1612 Unilateral primary osteoarthritis, left hip: Secondary | ICD-10-CM | POA: Diagnosis not present

## 2020-10-11 DIAGNOSIS — M6281 Muscle weakness (generalized): Secondary | ICD-10-CM | POA: Diagnosis not present

## 2020-10-11 DIAGNOSIS — R4789 Other speech disturbances: Secondary | ICD-10-CM | POA: Diagnosis not present

## 2020-10-11 DIAGNOSIS — F0390 Unspecified dementia without behavioral disturbance: Secondary | ICD-10-CM | POA: Diagnosis not present

## 2020-10-14 DIAGNOSIS — R2681 Unsteadiness on feet: Secondary | ICD-10-CM | POA: Diagnosis not present

## 2020-10-14 DIAGNOSIS — M1612 Unilateral primary osteoarthritis, left hip: Secondary | ICD-10-CM | POA: Diagnosis not present

## 2020-10-14 DIAGNOSIS — F0390 Unspecified dementia without behavioral disturbance: Secondary | ICD-10-CM | POA: Diagnosis not present

## 2020-10-14 DIAGNOSIS — M6281 Muscle weakness (generalized): Secondary | ICD-10-CM | POA: Diagnosis not present

## 2020-10-14 DIAGNOSIS — G309 Alzheimer's disease, unspecified: Secondary | ICD-10-CM | POA: Diagnosis not present

## 2020-10-14 DIAGNOSIS — R4789 Other speech disturbances: Secondary | ICD-10-CM | POA: Diagnosis not present

## 2020-10-15 DIAGNOSIS — M1612 Unilateral primary osteoarthritis, left hip: Secondary | ICD-10-CM | POA: Diagnosis not present

## 2020-10-15 DIAGNOSIS — G309 Alzheimer's disease, unspecified: Secondary | ICD-10-CM | POA: Diagnosis not present

## 2020-10-15 DIAGNOSIS — M6281 Muscle weakness (generalized): Secondary | ICD-10-CM | POA: Diagnosis not present

## 2020-10-15 DIAGNOSIS — F0390 Unspecified dementia without behavioral disturbance: Secondary | ICD-10-CM | POA: Diagnosis not present

## 2020-10-15 DIAGNOSIS — R2681 Unsteadiness on feet: Secondary | ICD-10-CM | POA: Diagnosis not present

## 2020-10-15 DIAGNOSIS — R4789 Other speech disturbances: Secondary | ICD-10-CM | POA: Diagnosis not present

## 2020-10-16 ENCOUNTER — Other Ambulatory Visit: Payer: Self-pay

## 2020-10-16 ENCOUNTER — Ambulatory Visit: Payer: Medicare Other | Admitting: Internal Medicine

## 2020-10-16 DIAGNOSIS — F0390 Unspecified dementia without behavioral disturbance: Secondary | ICD-10-CM | POA: Diagnosis not present

## 2020-10-16 DIAGNOSIS — R4789 Other speech disturbances: Secondary | ICD-10-CM | POA: Diagnosis not present

## 2020-10-16 DIAGNOSIS — M6281 Muscle weakness (generalized): Secondary | ICD-10-CM | POA: Diagnosis not present

## 2020-10-16 DIAGNOSIS — R2681 Unsteadiness on feet: Secondary | ICD-10-CM | POA: Diagnosis not present

## 2020-10-16 DIAGNOSIS — G309 Alzheimer's disease, unspecified: Secondary | ICD-10-CM | POA: Diagnosis not present

## 2020-10-16 DIAGNOSIS — M1612 Unilateral primary osteoarthritis, left hip: Secondary | ICD-10-CM | POA: Diagnosis not present

## 2020-10-17 DIAGNOSIS — G309 Alzheimer's disease, unspecified: Secondary | ICD-10-CM | POA: Diagnosis not present

## 2020-10-17 DIAGNOSIS — R4789 Other speech disturbances: Secondary | ICD-10-CM | POA: Diagnosis not present

## 2020-10-17 DIAGNOSIS — M1612 Unilateral primary osteoarthritis, left hip: Secondary | ICD-10-CM | POA: Diagnosis not present

## 2020-10-17 DIAGNOSIS — F0390 Unspecified dementia without behavioral disturbance: Secondary | ICD-10-CM | POA: Diagnosis not present

## 2020-10-17 DIAGNOSIS — R2681 Unsteadiness on feet: Secondary | ICD-10-CM | POA: Diagnosis not present

## 2020-10-17 DIAGNOSIS — M6281 Muscle weakness (generalized): Secondary | ICD-10-CM | POA: Diagnosis not present

## 2020-10-18 DIAGNOSIS — M1612 Unilateral primary osteoarthritis, left hip: Secondary | ICD-10-CM | POA: Diagnosis not present

## 2020-10-18 DIAGNOSIS — R4789 Other speech disturbances: Secondary | ICD-10-CM | POA: Diagnosis not present

## 2020-10-18 DIAGNOSIS — R2681 Unsteadiness on feet: Secondary | ICD-10-CM | POA: Diagnosis not present

## 2020-10-18 DIAGNOSIS — G309 Alzheimer's disease, unspecified: Secondary | ICD-10-CM | POA: Diagnosis not present

## 2020-10-18 DIAGNOSIS — F0390 Unspecified dementia without behavioral disturbance: Secondary | ICD-10-CM | POA: Diagnosis not present

## 2020-10-18 DIAGNOSIS — M6281 Muscle weakness (generalized): Secondary | ICD-10-CM | POA: Diagnosis not present

## 2020-10-21 DIAGNOSIS — M6281 Muscle weakness (generalized): Secondary | ICD-10-CM | POA: Diagnosis not present

## 2020-10-21 DIAGNOSIS — M1612 Unilateral primary osteoarthritis, left hip: Secondary | ICD-10-CM | POA: Diagnosis not present

## 2020-10-21 DIAGNOSIS — F0390 Unspecified dementia without behavioral disturbance: Secondary | ICD-10-CM | POA: Diagnosis not present

## 2020-10-21 DIAGNOSIS — R2681 Unsteadiness on feet: Secondary | ICD-10-CM | POA: Diagnosis not present

## 2020-10-21 DIAGNOSIS — G309 Alzheimer's disease, unspecified: Secondary | ICD-10-CM | POA: Diagnosis not present

## 2020-10-21 DIAGNOSIS — R4789 Other speech disturbances: Secondary | ICD-10-CM | POA: Diagnosis not present

## 2020-10-22 DIAGNOSIS — R2681 Unsteadiness on feet: Secondary | ICD-10-CM | POA: Diagnosis not present

## 2020-10-22 DIAGNOSIS — F0390 Unspecified dementia without behavioral disturbance: Secondary | ICD-10-CM | POA: Diagnosis not present

## 2020-10-22 DIAGNOSIS — M6281 Muscle weakness (generalized): Secondary | ICD-10-CM | POA: Diagnosis not present

## 2020-10-22 DIAGNOSIS — R4789 Other speech disturbances: Secondary | ICD-10-CM | POA: Diagnosis not present

## 2020-10-22 DIAGNOSIS — G309 Alzheimer's disease, unspecified: Secondary | ICD-10-CM | POA: Diagnosis not present

## 2020-10-22 DIAGNOSIS — M1612 Unilateral primary osteoarthritis, left hip: Secondary | ICD-10-CM | POA: Diagnosis not present

## 2020-10-23 DIAGNOSIS — M6281 Muscle weakness (generalized): Secondary | ICD-10-CM | POA: Diagnosis not present

## 2020-10-23 DIAGNOSIS — F0390 Unspecified dementia without behavioral disturbance: Secondary | ICD-10-CM | POA: Diagnosis not present

## 2020-10-23 DIAGNOSIS — R2681 Unsteadiness on feet: Secondary | ICD-10-CM | POA: Diagnosis not present

## 2020-10-23 DIAGNOSIS — R4789 Other speech disturbances: Secondary | ICD-10-CM | POA: Diagnosis not present

## 2020-10-23 DIAGNOSIS — M1612 Unilateral primary osteoarthritis, left hip: Secondary | ICD-10-CM | POA: Diagnosis not present

## 2020-10-23 DIAGNOSIS — G309 Alzheimer's disease, unspecified: Secondary | ICD-10-CM | POA: Diagnosis not present

## 2020-10-24 ENCOUNTER — Non-Acute Institutional Stay: Payer: Medicare Other | Admitting: Internal Medicine

## 2020-10-24 ENCOUNTER — Encounter: Payer: Self-pay | Admitting: Internal Medicine

## 2020-10-24 DIAGNOSIS — R4789 Other speech disturbances: Secondary | ICD-10-CM | POA: Diagnosis not present

## 2020-10-24 DIAGNOSIS — M1612 Unilateral primary osteoarthritis, left hip: Secondary | ICD-10-CM | POA: Diagnosis not present

## 2020-10-24 DIAGNOSIS — F0280 Dementia in other diseases classified elsewhere without behavioral disturbance: Secondary | ICD-10-CM

## 2020-10-24 DIAGNOSIS — R2681 Unsteadiness on feet: Secondary | ICD-10-CM | POA: Diagnosis not present

## 2020-10-24 DIAGNOSIS — I10 Essential (primary) hypertension: Secondary | ICD-10-CM

## 2020-10-24 DIAGNOSIS — G309 Alzheimer's disease, unspecified: Secondary | ICD-10-CM | POA: Diagnosis not present

## 2020-10-24 DIAGNOSIS — J309 Allergic rhinitis, unspecified: Secondary | ICD-10-CM

## 2020-10-24 DIAGNOSIS — G301 Alzheimer's disease with late onset: Secondary | ICD-10-CM

## 2020-10-24 DIAGNOSIS — M6281 Muscle weakness (generalized): Secondary | ICD-10-CM | POA: Diagnosis not present

## 2020-10-24 DIAGNOSIS — F0390 Unspecified dementia without behavioral disturbance: Secondary | ICD-10-CM | POA: Diagnosis not present

## 2020-10-24 NOTE — Progress Notes (Signed)
Location:    [place] Room Number: 6 Place of Service:  ALF [phone]) Provider:  Veleta Miners MD  Virgie Dad, MD  Patient Care Team: Virgie Dad, MD as PCP - General (Internal Medicine) Pa, [facility] (Ophthalmology) Dermatology, [facility] (Dermatology)  Extended Emergency Contact Information Primary Emergency Contact: Arelia Sneddon [place] of [place] Phone: [phone] Mobile Phone: [phone] Relation: Daughter  Code Status:  Full Code Goals of care: Advanced Directive information Advanced Directives 11/03/2018  Does Patient Have a Medical Advance Directive? No  Type of Advance Directive -  Does patient want to make changes to medical advance directive? -  Copy of [place] in Chart? -  Would patient like information on creating a medical advance directive? No - Patient declined     Chief Complaint  Patient presents with  . Acute Visit    HPI:  Pt is a 87 y.o. female seen today for an acute visit for New Admit to AL  Has h/o Dementia, HLD,Hypertension,  Diverticulitis and GERD  Patient is a new admit to AL. Her husband died suddenly in August 25, 2020. He was the main caregiver for her and she has early dementia. After his passing away the daughter decided that it was not safe for  patient to stay by herself.And moved her to AL  Patient did not have any acute complaints today. She states she does not know why she is here and wants to eventually go home.   Past Medical History:  Diagnosis Date  . Cataract   . Chest pain 02/16/2010   Myoview, negative for pharmacologic-stress induced ischemia, EF 74  . Complication of anesthesia 1954   hard to wake up  . Diverticulosis   . DUB (dysfunctional uterine bleeding)   . Dysrhythmia    h/o PVCs- none recently- no meds  . GERD (gastroesophageal reflux disease)   . Hearing reduced   . Hypertension    no meds currently  . Loss of consciousness ([place])   .  Memory loss   . Osteoarthritis of left hip   . Palpitation 02/02/13   A CardioNet MCOT (mobile cardiac outpatient telemetry ): NSR with PACs; sinus tachycardia with ventricular trigeminy. Brief atrial run. Facet cardiac; ventricular trigeminy and accelerated idioventricular rhythm. No significant tachyarrhythmias.  . Palpitations   . Postmenopausal bleeding 11/01/2014  . Unsteady gait   . Varicose veins of both legs with pain    Past Surgical History:  Procedure Laterality Date  . APPENDECTOMY    . CATARACT EXTRACTION     both eyes  . [place], [id number]   x 4  . DIAGNOSTIC LAPAROSCOPY    . [place]  . FRACTURE SURGERY    . HIP SURGERY Right   . HYSTEROSCOPY WITH D & C N/A 11/02/2014   Procedure: DILATATION AND CURETTAGE /HYSTEROSCOPY;  Surgeon: Janyth Contes, MD;  Location: [facility];  Service: Gynecology;  Laterality: N/A;  . Lower Extremity Venous Dopplers  May 2014   No thrombus or thrombophlebitis. R Greater Saphenous Vein - enlarged without other insufficiency (6 mm); right and left Small Saphenous Vein: Patent no valve insufficiency.  . TONSILLECTOMY    . TONSILLECTOMY AND ADENOIDECTOMY    . TRANSTHORACIC ECHOCARDIOGRAM  May 2014   Normal LV size and function. EF 55-60%. Grade 1 diastolic dysfunction. Elevated LV filling pressures. No regional W. May.    Allergies  Allergen Reactions  . Evista [  Raloxifene]   . Fosamax [Alendronate]   . Penicillins   . Polyoxyethylene Lauryl Ether [Sorbitan]   . Moxifloxacin Rash    Allergies as of 10/24/2020      Reactions   Evista [raloxifene]    Fosamax [alendronate]    Penicillins    Polyoxyethylene Lauryl Ether [sorbitan]    Moxifloxacin Rash      Medication List       Accurate as of October 24, 2020  3:23 PM. If you have any questions, ask your nurse or doctor.        STOP taking these medications   acetaminophen 325 MG tablet Commonly known as: TYLENOL Stopped by:  Virgie Dad, MD   calcium carbonate 1500 (600 Ca) MG Tabs tablet Commonly known as: OSCAL Stopped by: Virgie Dad, MD     TAKE these medications   calcium carbonate 500 MG chewable tablet Commonly known as: TUMS - dosed in mg elemental calcium Chew 1 tablet by mouth 2 (two) times daily as needed for indigestion or heartburn.   fluticasone 50 MCG/ACT nasal spray Commonly known as: FLONASE Place 2 sprays into both nostrils daily.   memantine 10 MG tablet Commonly known as: NAMENDA Take 10 mg by mouth 2 (two) times daily. What changed: Another medication with the same name was removed. Continue taking this medication, and follow the directions you see here. Changed by: Virgie Dad, MD   MULTIPLE VITAMIN PO Take by mouth daily.   olopatadine 0.1 % ophthalmic solution Commonly known as: PATANOL Place 1 drop into both eyes 2 (two) times daily as needed for allergies.       Review of Systems  Review of Systems  Constitutional: Negative for activity change, appetite change, chills, diaphoresis, fatigue and fever.  HENT: Negative for mouth sores, postnasal drip, rhinorrhea, sinus pain and sore throat.   Respiratory: Negative for apnea, cough, chest tightness, shortness of breath and wheezing.   Cardiovascular: Negative for chest pain, palpitations and leg swelling.  Gastrointestinal: Negative for abdominal distention, abdominal pain, constipation, diarrhea, nausea and vomiting.  Genitourinary: Negative for dysuria and frequency.  Musculoskeletal: Negative for arthralgias, joint swelling and myalgias.  Skin: Negative for rash.  Neurological: Negative for dizziness, syncope, weakness, light-headedness and numbness.  Psychiatric/Behavioral: Negative for behavioral problems, confusion and sleep disturbance.     Immunization History  Administered Date(s) Administered  . Influenza-Unspecified 09/15/2019  . Moderna Sars-Covid-2 Vaccination 09/15/2019  . PFIZER(Purple  Top)SARS-COV-2 Vaccination 10/03/2019, 10/22/2019  . Tdap 05/26/2016   Pertinent  Health Maintenance Due  Topic Date Due  . DEXA SCAN  Never done  . PNA vac Low Risk Adult (1 of 2 - PCV13) Never done  . INFLUENZA VACCINE  Completed   Fall Risk  10/23/2019 07/20/2017 03/30/2017 11/26/2016  Falls in the past year? 0 No No Yes  Number falls in past yr: - - - 1  Injury with Fall? - - - Yes   Functional Status Survey:    Vitals:   10/24/20 1008  BP: (!) 149/76  Pulse: 71  Resp: 20  Temp: 97.7 F (36.5 C)  SpO2: 99%  Weight: 163 lb 3.2 oz (74 kg)  Height: 5\' 5"  (1.651 m)   Body mass index is 27.16 kg/m. Physical Exam  Constitutional: . Well-developed and well-nourished.  HENT:  Head: Normocephalic.  Mouth/Throat: Oropharynx is clear and moist.  Eyes: Pupils are equal, round, and reactive to light.  Neck: Neck supple.  Cardiovascular: Normal rate and normal heart sounds.  No murmur heard. Pulmonary/Chest: Effort normal and breath sounds normal. No respiratory distress. No wheezes. She has no rales.  Abdominal: Soft. Bowel sounds are normal. No distension. There is no tenderness. There is no rebound.  Musculoskeletal: No edema.  Lymphadenopathy: none Neurological:No Focal Deficits Skin: Skin is warm and dry.  Psychiatric: Normal mood and affect. Behavior is normal. Thought content normal.    Labs reviewed: No results for input(s): NA, K, CL, CO2, GLUCOSE, BUN, CREATININE, CALCIUM, MG, PHOS in the last 8760 hours. No results for input(s): AST, ALT, ALKPHOS, BILITOT, PROT, ALBUMIN in the last 8760 hours. No results for input(s): WBC, NEUTROABS, HGB, HCT, MCV, PLT in the last 8760 hours.  No results found for: HGBA1C Lab Results  Component Value Date   CHOL  02/15/2010    184        ATP III CLASSIFICATION:  <200     mg/dL   Desirable  200-239  mg/dL   Borderline High  >=240    mg/dL   High          HDL 66 02/15/2010   LDLCALC (H) 02/15/2010    109        Total  Cholesterol/HDL:CHD Risk Coronary Heart Disease Risk Table                     Men   Women  1/2 Average Risk   3.4   3.3  Average Risk       5.0   4.4  2 X Average Risk   9.6   7.1  3 X Average Risk  23.4   11.0        Use the calculated Patient Ratio above and the CHD Risk Table to determine the patient's CHD Risk.        ATP III CLASSIFICATION (LDL):  <100     mg/dL   Optimal  100-129  mg/dL   Near or Above                    Optimal  130-159  mg/dL   Borderline  160-189  mg/dL   High  >190     mg/dL   Very High   TRIG 45 02/15/2010   CHOLHDL 2.8 02/15/2010    Significant Diagnostic Results in last 30 days:  No results found.  Assessment/Plan Late onset Alzheimer's dementia without behavioral disturbance (HCC) Last MMSE 22/30 MRI showed Chronic aAtropy  Essential hypertension - labile Not on Any Meds Allergic rhinitis,  On FLonase   Family/ staff Communication:   Labs/tests ordered:  CBC,CMP,LDL Total time spent in this patient care encounter was  45_  minutes; greater than 50% of the visit spent counseling patient and staff, reviewing records , Labs and coordinating care for problems addressed at this encounter.

## 2020-10-25 DIAGNOSIS — F0390 Unspecified dementia without behavioral disturbance: Secondary | ICD-10-CM | POA: Diagnosis not present

## 2020-10-25 DIAGNOSIS — G309 Alzheimer's disease, unspecified: Secondary | ICD-10-CM | POA: Diagnosis not present

## 2020-10-25 DIAGNOSIS — M1612 Unilateral primary osteoarthritis, left hip: Secondary | ICD-10-CM | POA: Diagnosis not present

## 2020-10-25 DIAGNOSIS — R2681 Unsteadiness on feet: Secondary | ICD-10-CM | POA: Diagnosis not present

## 2020-10-25 DIAGNOSIS — M6281 Muscle weakness (generalized): Secondary | ICD-10-CM | POA: Diagnosis not present

## 2020-10-25 DIAGNOSIS — R4789 Other speech disturbances: Secondary | ICD-10-CM | POA: Diagnosis not present

## 2020-10-28 DIAGNOSIS — F0390 Unspecified dementia without behavioral disturbance: Secondary | ICD-10-CM | POA: Diagnosis not present

## 2020-10-28 DIAGNOSIS — E039 Hypothyroidism, unspecified: Secondary | ICD-10-CM | POA: Diagnosis not present

## 2020-10-28 DIAGNOSIS — D649 Anemia, unspecified: Secondary | ICD-10-CM | POA: Diagnosis not present

## 2020-10-28 DIAGNOSIS — R4789 Other speech disturbances: Secondary | ICD-10-CM | POA: Diagnosis not present

## 2020-10-28 DIAGNOSIS — R2681 Unsteadiness on feet: Secondary | ICD-10-CM | POA: Diagnosis not present

## 2020-10-28 DIAGNOSIS — G309 Alzheimer's disease, unspecified: Secondary | ICD-10-CM | POA: Diagnosis not present

## 2020-10-28 DIAGNOSIS — M6281 Muscle weakness (generalized): Secondary | ICD-10-CM | POA: Diagnosis not present

## 2020-10-28 DIAGNOSIS — M1612 Unilateral primary osteoarthritis, left hip: Secondary | ICD-10-CM | POA: Diagnosis not present

## 2020-10-28 LAB — HEPATIC FUNCTION PANEL
ALT: 11 (ref 7–35)
AST: 13 (ref 13–35)
Alkaline Phosphatase: 94 (ref 25–125)
Bilirubin, Total: 0.4

## 2020-10-28 LAB — COMPREHENSIVE METABOLIC PANEL
Albumin: 3.4 — AB (ref 3.5–5.0)
Calcium: 9.1 (ref 8.7–10.7)
Globulin: 1.9

## 2020-10-28 LAB — BASIC METABOLIC PANEL
BUN: 15 (ref 4–21)
CO2: 23 — AB (ref 13–22)
Chloride: 106 (ref 99–108)
Creatinine: 0.9 (ref 0.5–1.1)
Glucose: 76
Potassium: 4.5 (ref 3.4–5.3)
Sodium: 138 (ref 137–147)

## 2020-10-28 LAB — CBC AND DIFFERENTIAL
HCT: 38 (ref 36–46)
Hemoglobin: 12.9 (ref 12.0–16.0)
Platelets: 268 (ref 150–399)
WBC: 5.3

## 2020-10-28 LAB — TSH: TSH: 2.88 (ref 0.41–5.90)

## 2020-10-28 LAB — CBC: RBC: 4.22 (ref 3.87–5.11)

## 2020-10-29 DIAGNOSIS — G309 Alzheimer's disease, unspecified: Secondary | ICD-10-CM | POA: Diagnosis not present

## 2020-10-29 DIAGNOSIS — M6281 Muscle weakness (generalized): Secondary | ICD-10-CM | POA: Diagnosis not present

## 2020-10-29 DIAGNOSIS — R4789 Other speech disturbances: Secondary | ICD-10-CM | POA: Diagnosis not present

## 2020-10-29 DIAGNOSIS — R2681 Unsteadiness on feet: Secondary | ICD-10-CM | POA: Diagnosis not present

## 2020-10-29 DIAGNOSIS — M1612 Unilateral primary osteoarthritis, left hip: Secondary | ICD-10-CM | POA: Diagnosis not present

## 2020-10-29 DIAGNOSIS — F0390 Unspecified dementia without behavioral disturbance: Secondary | ICD-10-CM | POA: Diagnosis not present

## 2020-10-30 DIAGNOSIS — G309 Alzheimer's disease, unspecified: Secondary | ICD-10-CM | POA: Diagnosis not present

## 2020-10-30 DIAGNOSIS — M1612 Unilateral primary osteoarthritis, left hip: Secondary | ICD-10-CM | POA: Diagnosis not present

## 2020-10-30 DIAGNOSIS — M6281 Muscle weakness (generalized): Secondary | ICD-10-CM | POA: Diagnosis not present

## 2020-10-30 DIAGNOSIS — F0390 Unspecified dementia without behavioral disturbance: Secondary | ICD-10-CM | POA: Diagnosis not present

## 2020-10-30 DIAGNOSIS — R2681 Unsteadiness on feet: Secondary | ICD-10-CM | POA: Diagnosis not present

## 2020-10-30 DIAGNOSIS — R4789 Other speech disturbances: Secondary | ICD-10-CM | POA: Diagnosis not present

## 2020-10-31 ENCOUNTER — Non-Acute Institutional Stay: Payer: Medicare Other | Admitting: Internal Medicine

## 2020-10-31 ENCOUNTER — Encounter: Payer: Self-pay | Admitting: Internal Medicine

## 2020-10-31 DIAGNOSIS — F0390 Unspecified dementia without behavioral disturbance: Secondary | ICD-10-CM | POA: Diagnosis not present

## 2020-10-31 DIAGNOSIS — R4789 Other speech disturbances: Secondary | ICD-10-CM | POA: Diagnosis not present

## 2020-10-31 DIAGNOSIS — R2681 Unsteadiness on feet: Secondary | ICD-10-CM | POA: Diagnosis not present

## 2020-10-31 DIAGNOSIS — G309 Alzheimer's disease, unspecified: Secondary | ICD-10-CM | POA: Diagnosis not present

## 2020-10-31 DIAGNOSIS — G301 Alzheimer's disease with late onset: Secondary | ICD-10-CM

## 2020-10-31 DIAGNOSIS — F0280 Dementia in other diseases classified elsewhere without behavioral disturbance: Secondary | ICD-10-CM | POA: Diagnosis not present

## 2020-10-31 DIAGNOSIS — R42 Dizziness and giddiness: Secondary | ICD-10-CM | POA: Diagnosis not present

## 2020-10-31 DIAGNOSIS — M1612 Unilateral primary osteoarthritis, left hip: Secondary | ICD-10-CM | POA: Diagnosis not present

## 2020-10-31 DIAGNOSIS — M6281 Muscle weakness (generalized): Secondary | ICD-10-CM | POA: Diagnosis not present

## 2020-10-31 NOTE — Progress Notes (Signed)
Location:    [place] Room Number: 6 Place of Service:  ALF [phone]) Provider:  Veleta Miners MD  Virgie Dad, MD  Patient Care Team: Virgie Dad, MD as PCP - General (Internal Medicine) Pa, [facility] (Ophthalmology) Dermatology, [facility] (Dermatology)  Extended Emergency Contact Information Primary Emergency Contact: Arelia Sneddon States of [place] Phone: [phone] Mobile Phone: [phone] Relation: Daughter  Code Status:  Full Code Goals of care: Advanced Directive information Advanced Directives 11/03/2018  Does Patient Have a Medical Advance Directive? No  Type of Advance Directive -  Does patient want to make changes to medical advance directive? -  Copy of [place] in Chart? -  Would patient like information on creating a medical advance directive? No - Patient declined     Chief Complaint  Patient presents with  . Acute Visit    Dizziness    HPI:  Pt is a 87 y.o. female seen today for an acute visit for Dizziness while doing Therapy  Has h/o Dementia, HLD,Hypertension,  Diverticulitis and GERD  Patient is a new admit to AL. Her husband died suddenly in 2020/08/11. He was the main caregiver for her and she has early dementia. After his passing away the daughter decided that it was not safe for  patient to stay by herself.And moved her to AL  Patient was seen today as per therapy at the end of the session yesterday patient complained of dizziness and would not walk back to her room.  She had to sit down a few minutes.  Today patient denies any dizziness she said that she just got tired.  She also said 'I think I was hungry' She is hard  to give more detailed history from her.   Denies any vertigo or lightheadedness No headaches no history of syncope.  Patient does have a history of postural dizziness in the past.  Past Medical History:  Diagnosis Date  . Cataract   . Chest pain 02/16/2010    Myoview, negative for pharmacologic-stress induced ischemia, EF 74  . Complication of anesthesia 1954   hard to wake up  . Diverticulosis   . DUB (dysfunctional uterine bleeding)   . Dysrhythmia    h/o PVCs- none recently- no meds  . GERD (gastroesophageal reflux disease)   . Hearing reduced   . Hypertension    no meds currently  . Loss of consciousness ([place])   . Memory loss   . Osteoarthritis of left hip   . Palpitation 02/02/13   A CardioNet MCOT (mobile cardiac outpatient telemetry ): NSR with PACs; sinus tachycardia with ventricular trigeminy. Brief atrial run. Facet cardiac; ventricular trigeminy and accelerated idioventricular rhythm. No significant tachyarrhythmias.  . Palpitations   . Postmenopausal bleeding 11/01/2014  . Unsteady gait   . Varicose veins of both legs with pain    Past Surgical History:  Procedure Laterality Date  . APPENDECTOMY    . CATARACT EXTRACTION     both eyes  . [place], [id number]   x 4  . DIAGNOSTIC LAPAROSCOPY    . [place]  . FRACTURE SURGERY    . HIP SURGERY Right   . HYSTEROSCOPY WITH D & C N/A 11/02/2014   Procedure: DILATATION AND CURETTAGE /HYSTEROSCOPY;  Surgeon: Janyth Contes, MD;  Location: [facility];  Service: Gynecology;  Laterality: N/A;  . Lower Extremity Venous Dopplers  May 2014   No thrombus or  thrombophlebitis. R Greater Saphenous Vein - enlarged without other insufficiency (6 mm); right and left Small Saphenous Vein: Patent no valve insufficiency.  . TONSILLECTOMY    . TONSILLECTOMY AND ADENOIDECTOMY    . TRANSTHORACIC ECHOCARDIOGRAM  May 2014   Normal LV size and function. EF 55-60%. Grade 1 diastolic dysfunction. Elevated LV filling pressures. No regional W. May.    Allergies  Allergen Reactions  . Evista [Raloxifene]   . Fosamax [Alendronate]   . Penicillins   . Polyoxyethylene Lauryl Ether [Sorbitan]   . Moxifloxacin Rash    Allergies as of 10/31/2020       Reactions   Evista [raloxifene]    Fosamax [alendronate]    Penicillins    Polyoxyethylene Lauryl Ether [sorbitan]    Moxifloxacin Rash      Medication List       Accurate as of October 31, 2020  1:38 PM. If you have any questions, ask your nurse or doctor.        calcium carbonate 500 MG chewable tablet Commonly known as: TUMS - dosed in mg elemental calcium Chew 1 tablet by mouth 2 (two) times daily as needed for indigestion or heartburn.   fluticasone 50 MCG/ACT nasal spray Commonly known as: FLONASE Place 2 sprays into both nostrils daily.   memantine 10 MG tablet Commonly known as: NAMENDA Take 10 mg by mouth 2 (two) times daily.   MULTIPLE VITAMIN PO Take by mouth daily.   olopatadine 0.1 % ophthalmic solution Commonly known as: PATANOL Place 1 drop into both eyes 2 (two) times daily as needed for allergies.       Review of Systems  Review of Systems  Constitutional: Negative for activity change, appetite change, chills, diaphoresis, fatigue and fever.  HENT: Negative for mouth sores, postnasal drip, rhinorrhea, sinus pain and sore throat.   Respiratory: Negative for apnea, cough, chest tightness, shortness of breath and wheezing.   Cardiovascular: Negative for chest pain, palpitations and leg swelling.  Gastrointestinal: Negative for abdominal distention, abdominal pain, constipation, diarrhea, nausea and vomiting.  Genitourinary: Negative for dysuria and frequency.  Musculoskeletal: Negative for arthralgias, joint swelling and myalgias.  Skin: Negative for rash.  Neurological: Negative for  syncope, weakness, light-headedness and numbness.  Psychiatric/Behavioral: Negative for behavioral problems, confusion and sleep disturbance.     Immunization History  Administered Date(s) Administered  . Influenza-Unspecified 09/15/2019  . Moderna Sars-Covid-2 Vaccination 09/15/2019  . PFIZER(Purple Top)SARS-COV-2 Vaccination 10/03/2019, 10/22/2019  . Tdap  05/26/2016   Pertinent  Health Maintenance Due  Topic Date Due  . DEXA SCAN  Never done  . PNA vac Low Risk Adult (1 of 2 - PCV13) Never done  . INFLUENZA VACCINE  Completed   Fall Risk  10/23/2019 07/20/2017 03/30/2017 11/26/2016  Falls in the past year? 0 No No Yes  Number falls in past yr: - - - 1  Injury with Fall? - - - Yes   Functional Status Survey:    Vitals:   10/31/20 1331  BP: (!) 158/73  Pulse: 66  Resp: 16  Temp: (!) 96.6 F (35.9 C)  SpO2: 98%  Weight: 163 lb 3.2 oz (74 kg)  Height: 5\' 5"  (1.651 m)   Body mass index is 27.16 kg/m. Physical Exam  Constitutional: . Well-developed and well-nourished.  HENT:  Head: Normocephalic.  Mouth/Throat: Oropharynx is clear and moist.  Eyes: Pupils are equal, round, and reactive to light.  Neck: Neck supple.  Cardiovascular: Normal rate and normal heart sounds.  No murmur  heard. Pulmonary/Chest: Effort normal and breath sounds normal. No respiratory distress. No wheezes. She has no rales.  Abdominal: Soft. Bowel sounds are normal. No distension. There is no tenderness. There is no rebound.  Musculoskeletal: No edema.  Lymphadenopathy: none Neurological: No Nystagmus No Signs of dizziness with Moving her in different position She stood up and was able to walk  with her walker  Skin: Skin is warm and dry.  Psychiatric: Normal mood and affect. Behavior is normal. Thought content normal.    Labs reviewed: Recent Labs    10/28/20 0000  NA 138  K 4.5  CL 106  CO2 23*  BUN 15  CREATININE 0.9  CALCIUM 9.1   Recent Labs    10/28/20 0000  AST 13  ALT 11  ALKPHOS 94  ALBUMIN 3.4*   Recent Labs    10/28/20 0000  WBC 5.3  HGB 12.9  HCT 38  PLT 268   Lab Results  Component Value Date   TSH 2.88 10/28/2020   No results found for: HGBA1C Lab Results  Component Value Date   CHOL  02/15/2010    184        ATP III CLASSIFICATION:  <200     mg/dL   Desirable  200-239  mg/dL   Borderline High  >=240     mg/dL   High          HDL 66 02/15/2010   LDLCALC (H) 02/15/2010    109        Total Cholesterol/HDL:CHD Risk Coronary Heart Disease Risk Table                     Men   Women  1/2 Average Risk   3.4   3.3  Average Risk       5.0   4.4  2 X Average Risk   9.6   7.1  3 X Average Risk  23.4   11.0        Use the calculated Patient Ratio above and the CHD Risk Table to determine the patient's CHD Risk.        ATP III CLASSIFICATION (LDL):  <100     mg/dL   Optimal  100-129  mg/dL   Near or Above                    Optimal  130-159  mg/dL   Borderline  160-189  mg/dL   High  >190     mg/dL   Very High   TRIG 45 02/15/2010   CHOLHDL 2.8 02/15/2010    Significant Diagnostic Results in last 30 days:  No results found.  Assessment/Plan  Dizziness ? Etiology Is asymptomatic right now Check For Orthostatic Blood Pressure when doing therapy Encourage PO fluids Check for Vestibular dysfunction   Late onset Alzheimer's dementia without behavioral disturbance ([place]) On [place]   Family/ staff Communication:   Labs/tests ordered:

## 2020-11-01 DIAGNOSIS — M1612 Unilateral primary osteoarthritis, left hip: Secondary | ICD-10-CM | POA: Diagnosis not present

## 2020-11-01 DIAGNOSIS — F0390 Unspecified dementia without behavioral disturbance: Secondary | ICD-10-CM | POA: Diagnosis not present

## 2020-11-01 DIAGNOSIS — R4789 Other speech disturbances: Secondary | ICD-10-CM | POA: Diagnosis not present

## 2020-11-01 DIAGNOSIS — G309 Alzheimer's disease, unspecified: Secondary | ICD-10-CM | POA: Diagnosis not present

## 2020-11-01 DIAGNOSIS — M6281 Muscle weakness (generalized): Secondary | ICD-10-CM | POA: Diagnosis not present

## 2020-11-01 DIAGNOSIS — R2681 Unsteadiness on feet: Secondary | ICD-10-CM | POA: Diagnosis not present

## 2020-11-04 DIAGNOSIS — R4789 Other speech disturbances: Secondary | ICD-10-CM | POA: Diagnosis not present

## 2020-11-04 DIAGNOSIS — F0390 Unspecified dementia without behavioral disturbance: Secondary | ICD-10-CM | POA: Diagnosis not present

## 2020-11-04 DIAGNOSIS — M1612 Unilateral primary osteoarthritis, left hip: Secondary | ICD-10-CM | POA: Diagnosis not present

## 2020-11-04 DIAGNOSIS — G309 Alzheimer's disease, unspecified: Secondary | ICD-10-CM | POA: Diagnosis not present

## 2020-11-04 DIAGNOSIS — R2681 Unsteadiness on feet: Secondary | ICD-10-CM | POA: Diagnosis not present

## 2020-11-04 DIAGNOSIS — M6281 Muscle weakness (generalized): Secondary | ICD-10-CM | POA: Diagnosis not present

## 2020-11-05 DIAGNOSIS — M6281 Muscle weakness (generalized): Secondary | ICD-10-CM | POA: Diagnosis not present

## 2020-11-05 DIAGNOSIS — F0390 Unspecified dementia without behavioral disturbance: Secondary | ICD-10-CM | POA: Diagnosis not present

## 2020-11-05 DIAGNOSIS — R2681 Unsteadiness on feet: Secondary | ICD-10-CM | POA: Diagnosis not present

## 2020-11-05 DIAGNOSIS — R4789 Other speech disturbances: Secondary | ICD-10-CM | POA: Diagnosis not present

## 2020-11-05 DIAGNOSIS — G309 Alzheimer's disease, unspecified: Secondary | ICD-10-CM | POA: Diagnosis not present

## 2020-11-05 DIAGNOSIS — M1612 Unilateral primary osteoarthritis, left hip: Secondary | ICD-10-CM | POA: Diagnosis not present

## 2020-11-06 DIAGNOSIS — R4789 Other speech disturbances: Secondary | ICD-10-CM | POA: Diagnosis not present

## 2020-11-06 DIAGNOSIS — G309 Alzheimer's disease, unspecified: Secondary | ICD-10-CM | POA: Diagnosis not present

## 2020-11-06 DIAGNOSIS — R2681 Unsteadiness on feet: Secondary | ICD-10-CM | POA: Diagnosis not present

## 2020-11-06 DIAGNOSIS — M6281 Muscle weakness (generalized): Secondary | ICD-10-CM | POA: Diagnosis not present

## 2020-11-06 DIAGNOSIS — F0390 Unspecified dementia without behavioral disturbance: Secondary | ICD-10-CM | POA: Diagnosis not present

## 2020-11-06 DIAGNOSIS — M1612 Unilateral primary osteoarthritis, left hip: Secondary | ICD-10-CM | POA: Diagnosis not present

## 2020-11-07 DIAGNOSIS — M1612 Unilateral primary osteoarthritis, left hip: Secondary | ICD-10-CM | POA: Diagnosis not present

## 2020-11-07 DIAGNOSIS — F0390 Unspecified dementia without behavioral disturbance: Secondary | ICD-10-CM | POA: Diagnosis not present

## 2020-11-07 DIAGNOSIS — M6281 Muscle weakness (generalized): Secondary | ICD-10-CM | POA: Diagnosis not present

## 2020-11-07 DIAGNOSIS — R4789 Other speech disturbances: Secondary | ICD-10-CM | POA: Diagnosis not present

## 2020-11-07 DIAGNOSIS — R2681 Unsteadiness on feet: Secondary | ICD-10-CM | POA: Diagnosis not present

## 2020-11-07 DIAGNOSIS — G309 Alzheimer's disease, unspecified: Secondary | ICD-10-CM | POA: Diagnosis not present

## 2020-11-08 DIAGNOSIS — R4789 Other speech disturbances: Secondary | ICD-10-CM | POA: Diagnosis not present

## 2020-11-08 DIAGNOSIS — M1612 Unilateral primary osteoarthritis, left hip: Secondary | ICD-10-CM | POA: Diagnosis not present

## 2020-11-08 DIAGNOSIS — G309 Alzheimer's disease, unspecified: Secondary | ICD-10-CM | POA: Diagnosis not present

## 2020-11-08 DIAGNOSIS — R2681 Unsteadiness on feet: Secondary | ICD-10-CM | POA: Diagnosis not present

## 2020-11-08 DIAGNOSIS — F0390 Unspecified dementia without behavioral disturbance: Secondary | ICD-10-CM | POA: Diagnosis not present

## 2020-11-08 DIAGNOSIS — M6281 Muscle weakness (generalized): Secondary | ICD-10-CM | POA: Diagnosis not present

## 2020-11-11 DIAGNOSIS — M1612 Unilateral primary osteoarthritis, left hip: Secondary | ICD-10-CM | POA: Diagnosis not present

## 2020-11-11 DIAGNOSIS — M6281 Muscle weakness (generalized): Secondary | ICD-10-CM | POA: Diagnosis not present

## 2020-11-11 DIAGNOSIS — G309 Alzheimer's disease, unspecified: Secondary | ICD-10-CM | POA: Diagnosis not present

## 2020-11-11 DIAGNOSIS — F0390 Unspecified dementia without behavioral disturbance: Secondary | ICD-10-CM | POA: Diagnosis not present

## 2020-11-11 DIAGNOSIS — R2681 Unsteadiness on feet: Secondary | ICD-10-CM | POA: Diagnosis not present

## 2020-11-11 DIAGNOSIS — R4789 Other speech disturbances: Secondary | ICD-10-CM | POA: Diagnosis not present

## 2020-11-13 DIAGNOSIS — G309 Alzheimer's disease, unspecified: Secondary | ICD-10-CM | POA: Diagnosis not present

## 2020-11-13 DIAGNOSIS — R2681 Unsteadiness on feet: Secondary | ICD-10-CM | POA: Diagnosis not present

## 2020-11-13 DIAGNOSIS — M6281 Muscle weakness (generalized): Secondary | ICD-10-CM | POA: Diagnosis not present

## 2020-11-13 DIAGNOSIS — M1612 Unilateral primary osteoarthritis, left hip: Secondary | ICD-10-CM | POA: Diagnosis not present

## 2020-11-13 DIAGNOSIS — R4789 Other speech disturbances: Secondary | ICD-10-CM | POA: Diagnosis not present

## 2020-11-13 DIAGNOSIS — F0390 Unspecified dementia without behavioral disturbance: Secondary | ICD-10-CM | POA: Diagnosis not present

## 2020-11-15 DIAGNOSIS — M6281 Muscle weakness (generalized): Secondary | ICD-10-CM | POA: Diagnosis not present

## 2020-11-15 DIAGNOSIS — R2681 Unsteadiness on feet: Secondary | ICD-10-CM | POA: Diagnosis not present

## 2020-11-15 DIAGNOSIS — G309 Alzheimer's disease, unspecified: Secondary | ICD-10-CM | POA: Diagnosis not present

## 2020-11-15 DIAGNOSIS — F0390 Unspecified dementia without behavioral disturbance: Secondary | ICD-10-CM | POA: Diagnosis not present

## 2020-11-15 DIAGNOSIS — R4789 Other speech disturbances: Secondary | ICD-10-CM | POA: Diagnosis not present

## 2020-11-15 DIAGNOSIS — M1612 Unilateral primary osteoarthritis, left hip: Secondary | ICD-10-CM | POA: Diagnosis not present

## 2020-11-18 DIAGNOSIS — R4789 Other speech disturbances: Secondary | ICD-10-CM | POA: Diagnosis not present

## 2020-11-18 DIAGNOSIS — G309 Alzheimer's disease, unspecified: Secondary | ICD-10-CM | POA: Diagnosis not present

## 2020-11-18 DIAGNOSIS — M6281 Muscle weakness (generalized): Secondary | ICD-10-CM | POA: Diagnosis not present

## 2020-11-18 DIAGNOSIS — R2681 Unsteadiness on feet: Secondary | ICD-10-CM | POA: Diagnosis not present

## 2020-11-18 DIAGNOSIS — M1612 Unilateral primary osteoarthritis, left hip: Secondary | ICD-10-CM | POA: Diagnosis not present

## 2020-11-18 DIAGNOSIS — F0390 Unspecified dementia without behavioral disturbance: Secondary | ICD-10-CM | POA: Diagnosis not present

## 2020-11-19 DIAGNOSIS — R2681 Unsteadiness on feet: Secondary | ICD-10-CM | POA: Diagnosis not present

## 2020-11-19 DIAGNOSIS — M1612 Unilateral primary osteoarthritis, left hip: Secondary | ICD-10-CM | POA: Diagnosis not present

## 2020-11-19 DIAGNOSIS — M6281 Muscle weakness (generalized): Secondary | ICD-10-CM | POA: Diagnosis not present

## 2020-11-19 DIAGNOSIS — G309 Alzheimer's disease, unspecified: Secondary | ICD-10-CM | POA: Diagnosis not present

## 2020-11-19 DIAGNOSIS — R4789 Other speech disturbances: Secondary | ICD-10-CM | POA: Diagnosis not present

## 2020-11-19 DIAGNOSIS — F0390 Unspecified dementia without behavioral disturbance: Secondary | ICD-10-CM | POA: Diagnosis not present

## 2020-11-20 DIAGNOSIS — M6281 Muscle weakness (generalized): Secondary | ICD-10-CM | POA: Diagnosis not present

## 2020-11-20 DIAGNOSIS — R4789 Other speech disturbances: Secondary | ICD-10-CM | POA: Diagnosis not present

## 2020-11-20 DIAGNOSIS — G309 Alzheimer's disease, unspecified: Secondary | ICD-10-CM | POA: Diagnosis not present

## 2020-11-20 DIAGNOSIS — M1612 Unilateral primary osteoarthritis, left hip: Secondary | ICD-10-CM | POA: Diagnosis not present

## 2020-11-20 DIAGNOSIS — R2681 Unsteadiness on feet: Secondary | ICD-10-CM | POA: Diagnosis not present

## 2020-11-20 DIAGNOSIS — F0390 Unspecified dementia without behavioral disturbance: Secondary | ICD-10-CM | POA: Diagnosis not present

## 2020-11-21 DIAGNOSIS — F0390 Unspecified dementia without behavioral disturbance: Secondary | ICD-10-CM | POA: Diagnosis not present

## 2020-11-21 DIAGNOSIS — M6281 Muscle weakness (generalized): Secondary | ICD-10-CM | POA: Diagnosis not present

## 2020-11-21 DIAGNOSIS — M1612 Unilateral primary osteoarthritis, left hip: Secondary | ICD-10-CM | POA: Diagnosis not present

## 2020-11-21 DIAGNOSIS — G309 Alzheimer's disease, unspecified: Secondary | ICD-10-CM | POA: Diagnosis not present

## 2020-11-21 DIAGNOSIS — R4789 Other speech disturbances: Secondary | ICD-10-CM | POA: Diagnosis not present

## 2020-11-21 DIAGNOSIS — R2681 Unsteadiness on feet: Secondary | ICD-10-CM | POA: Diagnosis not present

## 2020-11-22 DIAGNOSIS — M1612 Unilateral primary osteoarthritis, left hip: Secondary | ICD-10-CM | POA: Diagnosis not present

## 2020-11-22 DIAGNOSIS — R2681 Unsteadiness on feet: Secondary | ICD-10-CM | POA: Diagnosis not present

## 2020-11-22 DIAGNOSIS — M6281 Muscle weakness (generalized): Secondary | ICD-10-CM | POA: Diagnosis not present

## 2020-11-22 DIAGNOSIS — R4789 Other speech disturbances: Secondary | ICD-10-CM | POA: Diagnosis not present

## 2020-11-22 DIAGNOSIS — F0390 Unspecified dementia without behavioral disturbance: Secondary | ICD-10-CM | POA: Diagnosis not present

## 2020-11-22 DIAGNOSIS — G309 Alzheimer's disease, unspecified: Secondary | ICD-10-CM | POA: Diagnosis not present

## 2020-11-25 DIAGNOSIS — R2681 Unsteadiness on feet: Secondary | ICD-10-CM | POA: Diagnosis not present

## 2020-11-25 DIAGNOSIS — R4789 Other speech disturbances: Secondary | ICD-10-CM | POA: Diagnosis not present

## 2020-11-25 DIAGNOSIS — F0390 Unspecified dementia without behavioral disturbance: Secondary | ICD-10-CM | POA: Diagnosis not present

## 2020-11-25 DIAGNOSIS — G309 Alzheimer's disease, unspecified: Secondary | ICD-10-CM | POA: Diagnosis not present

## 2020-11-25 DIAGNOSIS — M6281 Muscle weakness (generalized): Secondary | ICD-10-CM | POA: Diagnosis not present

## 2020-11-25 DIAGNOSIS — M1612 Unilateral primary osteoarthritis, left hip: Secondary | ICD-10-CM | POA: Diagnosis not present

## 2020-11-26 ENCOUNTER — Encounter: Payer: Self-pay | Admitting: Nurse Practitioner

## 2020-11-26 ENCOUNTER — Non-Acute Institutional Stay: Payer: Medicare Other | Admitting: Nurse Practitioner

## 2020-11-26 DIAGNOSIS — I10 Essential (primary) hypertension: Secondary | ICD-10-CM | POA: Diagnosis not present

## 2020-11-26 DIAGNOSIS — F0150 Vascular dementia without behavioral disturbance: Secondary | ICD-10-CM | POA: Diagnosis not present

## 2020-11-26 DIAGNOSIS — K219 Gastro-esophageal reflux disease without esophagitis: Secondary | ICD-10-CM

## 2020-11-26 DIAGNOSIS — E782 Mixed hyperlipidemia: Secondary | ICD-10-CM

## 2020-11-26 DIAGNOSIS — J069 Acute upper respiratory infection, unspecified: Secondary | ICD-10-CM | POA: Insufficient documentation

## 2020-11-26 DIAGNOSIS — J3089 Other allergic rhinitis: Secondary | ICD-10-CM

## 2020-11-26 NOTE — Assessment & Plan Note (Signed)
Will start Mucinex bid/Claritin qd x 3 days, obtain CXR ap/laterla to r/o PNA. Covid Test. Observe.

## 2020-11-26 NOTE — Progress Notes (Unsigned)
Location:    [place] Room Number: 6 Place of Service:  ALF [phone]) Provider: Marlana Latus NP  Virgie Dad, MD  Patient Care Team: Virgie Dad, MD as PCP - General (Internal Medicine) Pa, [facility] (Ophthalmology) Dermatology, [facility] (Dermatology)  Extended Emergency Contact Information Primary Emergency Contact: Arelia Sneddon [place] Phone: [phone] Mobile Phone: [phone] Relation: Daughter  Code Status:  DNR Goals of care: Advanced Directive information Advanced Directives 11/03/2018  Does Patient Have a Medical Advance Directive? No  Type of Advance Directive -  Does patient want to make changes to medical advance directive? -  Copy of [place] in Chart? -  Would patient like information on creating a medical advance directive? No - Patient declined     Chief Complaint  Patient presents with  . Acute Visit    Running nose    HPI:  Pt is a 87 y.o. female seen today for an acute visit for cough, sore throat,  running nose x 1 day. Denied chest pain, phlegm production, or fever.   Dementia, no behavioral issue, resides in [place] [facility], takes Memantine. TSH 2.88 10/28/20  HLD LDL 109 2011  HTN controlled  GERD stable  Allergic rhinitis takes Flonase   Past Medical History:  Diagnosis Date  . Cataract   . Chest pain 02/16/2010   Myoview, negative for pharmacologic-stress induced ischemia, EF 74  . Complication of anesthesia 1954   hard to wake up  . Diverticulosis   . DUB (dysfunctional uterine bleeding)   . Dysrhythmia    h/o PVCs- none recently- no meds  . GERD (gastroesophageal reflux disease)   . Hearing reduced   . Hypertension    no meds currently  . Loss of consciousness ([place])   . Memory loss   . Osteoarthritis of left hip   . Palpitation 02/02/13   A CardioNet MCOT (mobile cardiac outpatient telemetry ): NSR with PACs; sinus tachycardia with ventricular trigeminy. Brief  atrial run. Facet cardiac; ventricular trigeminy and accelerated idioventricular rhythm. No significant tachyarrhythmias.  . Palpitations   . Postmenopausal bleeding 11/01/2014  . Unsteady gait   . Varicose veins of both legs with pain    Past Surgical History:  Procedure Laterality Date  . APPENDECTOMY    . CATARACT EXTRACTION     both eyes  . [place], [id number]   x 4  . DIAGNOSTIC LAPAROSCOPY    . [place]  . FRACTURE SURGERY    . HIP SURGERY Right   . HYSTEROSCOPY WITH D & C N/A 11/02/2014   Procedure: DILATATION AND CURETTAGE /HYSTEROSCOPY;  Surgeon: Janyth Contes, MD;  Location: [facility];  Service: Gynecology;  Laterality: N/A;  . Lower Extremity Venous Dopplers  May 2014   No thrombus or thrombophlebitis. R Greater Saphenous Vein - enlarged without other insufficiency (6 mm); right and left Small Saphenous Vein: Patent no valve insufficiency.  . TONSILLECTOMY    . TONSILLECTOMY AND ADENOIDECTOMY    . TRANSTHORACIC ECHOCARDIOGRAM  May 2014   Normal LV size and function. EF 55-60%. Grade 1 diastolic dysfunction. Elevated LV filling pressures. No regional W. May.    Allergies  Allergen Reactions  . Evista [Raloxifene]   . Fosamax [Alendronate]   . Penicillins   . Polyoxyethylene Lauryl Ether [Sorbitan]   . Moxifloxacin Rash    Allergies as of 11/26/2020      Reactions   Evista [  raloxifene]    Fosamax [alendronate]    Penicillins    Polyoxyethylene Lauryl Ether [sorbitan]    Moxifloxacin Rash      Medication List       Accurate as of November 26, 2020 11:59 PM. If you have any questions, ask your nurse or doctor.        calcium carbonate 500 MG chewable tablet Commonly known as: TUMS - dosed in mg elemental calcium Chew 1 tablet by mouth 2 (two) times daily as needed for indigestion or heartburn.   fluticasone 50 MCG/ACT nasal spray Commonly known as: FLONASE Place 2 sprays into both nostrils daily.   memantine  10 MG tablet Commonly known as: NAMENDA Take 10 mg by mouth 2 (two) times daily.   MULTIPLE VITAMIN PO Take by mouth daily.   olopatadine 0.1 % ophthalmic solution Commonly known as: PATANOL Place 1 drop into both eyes 2 (two) times daily as needed for allergies.       Review of Systems  Constitutional: Negative for activity change, appetite change, chills, diaphoresis, fatigue and fever.  HENT: Positive for congestion, hearing loss and rhinorrhea. Negative for facial swelling, mouth sores, postnasal drip, sinus pressure, sinus pain, sneezing, sore throat, trouble swallowing and voice change.   Respiratory: Positive for cough. Negative for chest tightness, shortness of breath and wheezing.   Cardiovascular: Negative for chest pain, palpitations and leg swelling.  Gastrointestinal: Negative for abdominal pain, constipation, nausea and vomiting.  Genitourinary: Negative for dysuria and urgency.  Musculoskeletal: Negative for gait problem.  Skin: Negative for color change.  Neurological: Negative for dizziness, speech difficulty, weakness and headaches.       Dementia  Psychiatric/Behavioral: Negative for behavioral problems, hallucinations and sleep disturbance. The patient is not nervous/anxious.     Immunization History  Administered Date(s) Administered  . Influenza-Unspecified 09/15/2019  . Moderna Sars-Covid-2 Vaccination 09/15/2019  . PFIZER(Purple Top)SARS-COV-2 Vaccination 10/03/2019, 10/22/2019  . Tdap 05/26/2016   Pertinent  Health Maintenance Due  Topic Date Due  . DEXA SCAN  Never done  . PNA vac Low Risk Adult (1 of 2 - PCV13) Never done  . INFLUENZA VACCINE  Completed   Fall Risk  10/23/2019 07/20/2017 03/30/2017 11/26/2016  Falls in the past year? 0 No No Yes  Number falls in past yr: - - - 1  Injury with Fall? - - - Yes   Functional Status Survey:    Vitals:   11/26/20 1108  BP: (!) 141/90  Pulse: 78  Resp: (!) 22  Temp: (!) 97.5 F (36.4 C)  SpO2: 96%   Weight: 160 lb 3.2 oz (72.7 kg)  Height: 5\' 5"  (1.651 m)   Body mass index is 26.66 kg/m. Physical Exam Vitals and nursing note reviewed.  Constitutional:      Appearance: Normal appearance.  HENT:     Head: Normocephalic and atraumatic.     Nose: Congestion and rhinorrhea present.     Mouth/Throat:     Mouth: Mucous membranes are moist.     Pharynx: Posterior oropharyngeal erythema present. No oropharyngeal exudate.     Comments: Mild erythema in throat.  Eyes:     Extraocular Movements: Extraocular movements intact.     Conjunctiva/sclera: Conjunctivae normal.     Pupils: Pupils are equal, round, and reactive to light.  Cardiovascular:     Rate and Rhythm: Normal rate and regular rhythm.     Heart sounds: No murmur heard.   Pulmonary:     Effort: Pulmonary effort is  normal.     Breath sounds: Rhonchi present. No wheezing or rales.     Comments: Scattered mild expiratory rhonchi posterior mid lungs.  Abdominal:     General: Bowel sounds are normal.     Palpations: Abdomen is soft.     Tenderness: There is no abdominal tenderness.  Musculoskeletal:     Cervical back: Normal range of motion and neck supple.     Right lower leg: No edema.     Left lower leg: No edema.  Skin:    General: Skin is warm and dry.  Neurological:     General: No focal deficit present.     Mental Status: She is alert. Mental status is at baseline.     Motor: No weakness.     Coordination: Coordination normal.     Gait: Gait normal.     Comments: Oriented to person, place.   Psychiatric:        Mood and Affect: Mood normal.        Behavior: Behavior normal.        Thought Content: Thought content normal.        Judgment: Judgment normal.     Labs reviewed: Recent Labs    10/28/20 0000  NA 138  K 4.5  CL 106  CO2 23*  BUN 15  CREATININE 0.9  CALCIUM 9.1   Recent Labs    10/28/20 0000  AST 13  ALT 11  ALKPHOS 94  ALBUMIN 3.4*   Recent Labs    10/28/20 0000  WBC 5.3   HGB 12.9  HCT 38  PLT 268   Lab Results  Component Value Date   TSH 2.88 10/28/2020   No results found for: HGBA1C Lab Results  Component Value Date   CHOL  02/15/2010    184        ATP III CLASSIFICATION:  <200     mg/dL   Desirable  200-239  mg/dL   Borderline High  >=240    mg/dL   High          HDL 66 02/15/2010   LDLCALC (H) 02/15/2010    109        Total Cholesterol/HDL:CHD Risk Coronary Heart Disease Risk Table                     Men   Women  1/2 Average Risk   3.4   3.3  Average Risk       5.0   4.4  2 X Average Risk   9.6   7.1  3 X Average Risk  23.4   11.0        Use the calculated Patient Ratio above and the CHD Risk Table to determine the patient's CHD Risk.        ATP III CLASSIFICATION (LDL):  <100     mg/dL   Optimal  100-129  mg/dL   Near or Above                    Optimal  130-159  mg/dL   Borderline  160-189  mg/dL   High  >190     mg/dL   Very High   TRIG 45 02/15/2010   CHOLHDL 2.8 02/15/2010    Significant Diagnostic Results in last 30 days:  No results found.  Assessment/Plan Upper respiratory infection Will start Mucinex bid/Claritin qd x 3 days, obtain CXR ap/laterla to r/o PNA. Covid Test. Observe.  Essential hypertension - labile At high end of normal, denied chest pain/pressure, palpitation, HA, change of vision, will observe .  Dementia without behavioral disturbance (HCC) Dementia, no behavioral issue, resides in [facility], takes Memantine. TSH 2.88 10/28/20   HLD (hyperlipidemia) LDL 109 2011, not taking statin.   GERD (gastroesophageal reflux disease) Stable, diet controlled.   Allergic rhinitis Managed on Flonase.     Family/ staff Communication: plan of care reviewed with the patient and charge nurse.   Labs/tests ordered: CXR ap/lateral, Covid test.   Time spend 40 minutes.

## 2020-11-27 ENCOUNTER — Encounter: Payer: Self-pay | Admitting: Nurse Practitioner

## 2020-11-27 DIAGNOSIS — M6281 Muscle weakness (generalized): Secondary | ICD-10-CM | POA: Diagnosis not present

## 2020-11-27 DIAGNOSIS — J309 Allergic rhinitis, unspecified: Secondary | ICD-10-CM | POA: Insufficient documentation

## 2020-11-27 DIAGNOSIS — M1612 Unilateral primary osteoarthritis, left hip: Secondary | ICD-10-CM | POA: Diagnosis not present

## 2020-11-27 DIAGNOSIS — E785 Hyperlipidemia, unspecified: Secondary | ICD-10-CM | POA: Insufficient documentation

## 2020-11-27 DIAGNOSIS — G309 Alzheimer's disease, unspecified: Secondary | ICD-10-CM | POA: Diagnosis not present

## 2020-11-27 DIAGNOSIS — R2681 Unsteadiness on feet: Secondary | ICD-10-CM | POA: Diagnosis not present

## 2020-11-27 DIAGNOSIS — F0390 Unspecified dementia without behavioral disturbance: Secondary | ICD-10-CM | POA: Diagnosis not present

## 2020-11-27 DIAGNOSIS — R059 Cough, unspecified: Secondary | ICD-10-CM | POA: Diagnosis not present

## 2020-11-27 DIAGNOSIS — R4789 Other speech disturbances: Secondary | ICD-10-CM | POA: Diagnosis not present

## 2020-11-27 DIAGNOSIS — K219 Gastro-esophageal reflux disease without esophagitis: Secondary | ICD-10-CM | POA: Insufficient documentation

## 2020-11-27 NOTE — Assessment & Plan Note (Signed)
Dementia, no behavioral issue, resides in [place] [facility], takes Memantine. TSH 2.88 10/28/20

## 2020-11-27 NOTE — Assessment & Plan Note (Signed)
At high end of normal, denied chest pain/pressure, palpitation, HA, change of vision, will observe .

## 2020-11-27 NOTE — Assessment & Plan Note (Signed)
Managed on Flonase.

## 2020-11-27 NOTE — Assessment & Plan Note (Signed)
LDL 109 2011, not taking statin.

## 2020-11-27 NOTE — Assessment & Plan Note (Signed)
Stable, diet controlled  

## 2020-11-29 DIAGNOSIS — F0390 Unspecified dementia without behavioral disturbance: Secondary | ICD-10-CM | POA: Diagnosis not present

## 2020-11-29 DIAGNOSIS — M6281 Muscle weakness (generalized): Secondary | ICD-10-CM | POA: Diagnosis not present

## 2020-11-29 DIAGNOSIS — G309 Alzheimer's disease, unspecified: Secondary | ICD-10-CM | POA: Diagnosis not present

## 2020-11-29 DIAGNOSIS — L602 Onychogryphosis: Secondary | ICD-10-CM | POA: Diagnosis not present

## 2020-11-29 DIAGNOSIS — M1612 Unilateral primary osteoarthritis, left hip: Secondary | ICD-10-CM | POA: Diagnosis not present

## 2020-11-29 DIAGNOSIS — R2681 Unsteadiness on feet: Secondary | ICD-10-CM | POA: Diagnosis not present

## 2020-11-29 DIAGNOSIS — L84 Corns and callosities: Secondary | ICD-10-CM | POA: Diagnosis not present

## 2020-11-29 DIAGNOSIS — R4789 Other speech disturbances: Secondary | ICD-10-CM | POA: Diagnosis not present

## 2020-11-29 DIAGNOSIS — Q6689 Other  specified congenital deformities of feet: Secondary | ICD-10-CM | POA: Diagnosis not present

## 2020-12-02 DIAGNOSIS — R2681 Unsteadiness on feet: Secondary | ICD-10-CM | POA: Diagnosis not present

## 2020-12-02 DIAGNOSIS — M1612 Unilateral primary osteoarthritis, left hip: Secondary | ICD-10-CM | POA: Diagnosis not present

## 2020-12-02 DIAGNOSIS — F0390 Unspecified dementia without behavioral disturbance: Secondary | ICD-10-CM | POA: Diagnosis not present

## 2020-12-02 DIAGNOSIS — M6281 Muscle weakness (generalized): Secondary | ICD-10-CM | POA: Diagnosis not present

## 2020-12-02 DIAGNOSIS — R4789 Other speech disturbances: Secondary | ICD-10-CM | POA: Diagnosis not present

## 2020-12-02 DIAGNOSIS — G309 Alzheimer's disease, unspecified: Secondary | ICD-10-CM | POA: Diagnosis not present

## 2020-12-03 DIAGNOSIS — R4789 Other speech disturbances: Secondary | ICD-10-CM | POA: Diagnosis not present

## 2020-12-03 DIAGNOSIS — M1612 Unilateral primary osteoarthritis, left hip: Secondary | ICD-10-CM | POA: Diagnosis not present

## 2020-12-03 DIAGNOSIS — F0390 Unspecified dementia without behavioral disturbance: Secondary | ICD-10-CM | POA: Diagnosis not present

## 2020-12-03 DIAGNOSIS — R2681 Unsteadiness on feet: Secondary | ICD-10-CM | POA: Diagnosis not present

## 2020-12-03 DIAGNOSIS — M6281 Muscle weakness (generalized): Secondary | ICD-10-CM | POA: Diagnosis not present

## 2020-12-03 DIAGNOSIS — G309 Alzheimer's disease, unspecified: Secondary | ICD-10-CM | POA: Diagnosis not present

## 2020-12-04 DIAGNOSIS — M6281 Muscle weakness (generalized): Secondary | ICD-10-CM | POA: Diagnosis not present

## 2020-12-04 DIAGNOSIS — R4789 Other speech disturbances: Secondary | ICD-10-CM | POA: Diagnosis not present

## 2020-12-04 DIAGNOSIS — F0390 Unspecified dementia without behavioral disturbance: Secondary | ICD-10-CM | POA: Diagnosis not present

## 2020-12-04 DIAGNOSIS — R2681 Unsteadiness on feet: Secondary | ICD-10-CM | POA: Diagnosis not present

## 2020-12-04 DIAGNOSIS — G309 Alzheimer's disease, unspecified: Secondary | ICD-10-CM | POA: Diagnosis not present

## 2020-12-04 DIAGNOSIS — M1612 Unilateral primary osteoarthritis, left hip: Secondary | ICD-10-CM | POA: Diagnosis not present

## 2020-12-06 DIAGNOSIS — F0390 Unspecified dementia without behavioral disturbance: Secondary | ICD-10-CM | POA: Diagnosis not present

## 2020-12-06 DIAGNOSIS — G309 Alzheimer's disease, unspecified: Secondary | ICD-10-CM | POA: Diagnosis not present

## 2020-12-06 DIAGNOSIS — R2681 Unsteadiness on feet: Secondary | ICD-10-CM | POA: Diagnosis not present

## 2020-12-06 DIAGNOSIS — R4789 Other speech disturbances: Secondary | ICD-10-CM | POA: Diagnosis not present

## 2020-12-06 DIAGNOSIS — M6281 Muscle weakness (generalized): Secondary | ICD-10-CM | POA: Diagnosis not present

## 2020-12-06 DIAGNOSIS — M1612 Unilateral primary osteoarthritis, left hip: Secondary | ICD-10-CM | POA: Diagnosis not present

## 2020-12-10 DIAGNOSIS — R4789 Other speech disturbances: Secondary | ICD-10-CM | POA: Diagnosis not present

## 2020-12-10 DIAGNOSIS — M1612 Unilateral primary osteoarthritis, left hip: Secondary | ICD-10-CM | POA: Diagnosis not present

## 2020-12-10 DIAGNOSIS — R2681 Unsteadiness on feet: Secondary | ICD-10-CM | POA: Diagnosis not present

## 2020-12-10 DIAGNOSIS — M6281 Muscle weakness (generalized): Secondary | ICD-10-CM | POA: Diagnosis not present

## 2020-12-10 DIAGNOSIS — F0390 Unspecified dementia without behavioral disturbance: Secondary | ICD-10-CM | POA: Diagnosis not present

## 2020-12-10 DIAGNOSIS — G309 Alzheimer's disease, unspecified: Secondary | ICD-10-CM | POA: Diagnosis not present

## 2020-12-11 DIAGNOSIS — R2681 Unsteadiness on feet: Secondary | ICD-10-CM | POA: Diagnosis not present

## 2020-12-11 DIAGNOSIS — F0390 Unspecified dementia without behavioral disturbance: Secondary | ICD-10-CM | POA: Diagnosis not present

## 2020-12-11 DIAGNOSIS — R4789 Other speech disturbances: Secondary | ICD-10-CM | POA: Diagnosis not present

## 2020-12-11 DIAGNOSIS — M6281 Muscle weakness (generalized): Secondary | ICD-10-CM | POA: Diagnosis not present

## 2020-12-11 DIAGNOSIS — G309 Alzheimer's disease, unspecified: Secondary | ICD-10-CM | POA: Diagnosis not present

## 2020-12-11 DIAGNOSIS — M1612 Unilateral primary osteoarthritis, left hip: Secondary | ICD-10-CM | POA: Diagnosis not present

## 2020-12-13 DIAGNOSIS — F0390 Unspecified dementia without behavioral disturbance: Secondary | ICD-10-CM | POA: Diagnosis not present

## 2020-12-13 DIAGNOSIS — R4789 Other speech disturbances: Secondary | ICD-10-CM | POA: Diagnosis not present

## 2020-12-17 DIAGNOSIS — F0390 Unspecified dementia without behavioral disturbance: Secondary | ICD-10-CM | POA: Diagnosis not present

## 2020-12-17 DIAGNOSIS — R4789 Other speech disturbances: Secondary | ICD-10-CM | POA: Diagnosis not present

## 2020-12-18 DIAGNOSIS — R4789 Other speech disturbances: Secondary | ICD-10-CM | POA: Diagnosis not present

## 2020-12-18 DIAGNOSIS — F0390 Unspecified dementia without behavioral disturbance: Secondary | ICD-10-CM | POA: Diagnosis not present

## 2020-12-20 DIAGNOSIS — R4789 Other speech disturbances: Secondary | ICD-10-CM | POA: Diagnosis not present

## 2020-12-20 DIAGNOSIS — F0390 Unspecified dementia without behavioral disturbance: Secondary | ICD-10-CM | POA: Diagnosis not present

## 2020-12-24 DIAGNOSIS — R4789 Other speech disturbances: Secondary | ICD-10-CM | POA: Diagnosis not present

## 2020-12-24 DIAGNOSIS — F0390 Unspecified dementia without behavioral disturbance: Secondary | ICD-10-CM | POA: Diagnosis not present

## 2020-12-25 ENCOUNTER — Encounter: Payer: Self-pay | Admitting: Orthopedic Surgery

## 2020-12-25 ENCOUNTER — Non-Acute Institutional Stay: Payer: Medicare Other | Admitting: Orthopedic Surgery

## 2020-12-25 DIAGNOSIS — G8929 Other chronic pain: Secondary | ICD-10-CM | POA: Diagnosis not present

## 2020-12-25 DIAGNOSIS — F0390 Unspecified dementia without behavioral disturbance: Secondary | ICD-10-CM | POA: Diagnosis not present

## 2020-12-25 DIAGNOSIS — M546 Pain in thoracic spine: Secondary | ICD-10-CM

## 2020-12-25 DIAGNOSIS — R4789 Other speech disturbances: Secondary | ICD-10-CM | POA: Diagnosis not present

## 2020-12-25 NOTE — Progress Notes (Signed)
Location:  [place] Room Number: 6 Place of Service:  ALF [phone]) Provider:  Windell Moulding, AGNP-C  Virgie Dad, MD  Patient Care Team: Virgie Dad, MD as PCP - General (Internal Medicine) Pa, [facility] (Ophthalmology) Dermatology, [facility] (Dermatology)  Extended Emergency Contact Information Primary Emergency Contact: Arelia Sneddon [place] of [place] Phone: [phone] Mobile Phone: [phone] Relation: Daughter   Goals of care: Advanced Directive information Advanced Directives 11/03/2018  Does Patient Have a Medical Advance Directive? No  Type of Advance Directive -  Does patient want to make changes to medical advance directive? -  Copy of [place] in Chart? -  Would patient like information on creating a medical advance directive? No - Patient declined     Chief Complaint  Patient presents with  . Acute Visit    Back pain    HPI:  Pt is a 87 y.o. female seen today for acute visit for back pain.   Nursing staff reports she has been requesting more prn tylenol for back pain. She has had intermittent back pain for most of her life. She has been told she has a slight curvature of her spine in thoracic region. She believe this is the main cause for her ongoing pain. Today, she complains of back pain near her bra line. Pain rated 4/10. Pain stimulated by movement. Pain sometimes worse in AM. Tylenol 650 mg po prn helps reduce pain. No recent falls or injuries. Asking for other interventions today.    Past Medical History:  Diagnosis Date  . Cataract   . Chest pain 02/16/2010   Myoview, negative for pharmacologic-stress induced ischemia, EF 74  . Complication of anesthesia 1954   hard to wake up  . Diverticulosis   . DUB (dysfunctional uterine bleeding)   . Dysrhythmia    h/o PVCs- none recently- no meds  . GERD (gastroesophageal reflux disease)   . Hearing reduced   . Hypertension    no meds  currently  . Loss of consciousness ([place])   . Memory loss   . Osteoarthritis of left hip   . Palpitation 02/02/13   A CardioNet MCOT (mobile cardiac outpatient telemetry ): NSR with PACs; sinus tachycardia with ventricular trigeminy. Brief atrial run. Facet cardiac; ventricular trigeminy and accelerated idioventricular rhythm. No significant tachyarrhythmias.  . Palpitations   . Postmenopausal bleeding 11/01/2014  . Unsteady gait   . Varicose veins of both legs with pain    Past Surgical History:  Procedure Laterality Date  . APPENDECTOMY    . CATARACT EXTRACTION     both eyes  . [place], [id number]   x 4  . DIAGNOSTIC LAPAROSCOPY    . [place]  . FRACTURE SURGERY    . HIP SURGERY Right   . HYSTEROSCOPY WITH D & C N/A 11/02/2014   Procedure: DILATATION AND CURETTAGE /HYSTEROSCOPY;  Surgeon: Janyth Contes, MD;  Location: [place] [facility];  Service: Gynecology;  Laterality: N/A;  . Lower Extremity Venous Dopplers  May 2014   No thrombus or thrombophlebitis. R Greater Saphenous Vein - enlarged without other insufficiency (6 mm); right and left Small Saphenous Vein: Patent no valve insufficiency.  . TONSILLECTOMY    . TONSILLECTOMY AND ADENOIDECTOMY    . TRANSTHORACIC ECHOCARDIOGRAM  May 2014   Normal LV size and function. EF 55-60%. Grade 1 diastolic dysfunction. Elevated LV filling pressures. No regional W. May.    Allergies  Allergen Reactions  . Evista [Raloxifene]   . Fosamax [Alendronate]   . Penicillins   . Polyoxyethylene Lauryl Ether [Sorbitan]   . Moxifloxacin Rash    Outpatient Encounter Medications as of 12/25/2020  Medication Sig  . calcium carbonate (TUMS - DOSED IN MG ELEMENTAL CALCIUM) 500 MG chewable tablet Chew 1 tablet by mouth 2 (two) times daily as needed for indigestion or heartburn.  . fluticasone (FLONASE) 50 MCG/ACT nasal spray Place 2 sprays into both nostrils daily.  . memantine (NAMENDA) 10 MG tablet Take 10 mg by  mouth 2 (two) times daily.  . MULTIPLE VITAMIN PO Take by mouth daily.  [place] Kitchen olopatadine (PATANOL) 0.1 % ophthalmic solution Place 1 drop into both eyes 2 (two) times daily as needed for allergies.   No facility-administered encounter medications on file as of 12/25/2020.    Review of Systems  Constitutional: Negative for activity change, appetite change and fatigue.  Respiratory: Negative for cough, shortness of breath and wheezing.   Cardiovascular: Negative for chest pain and leg swelling.  Musculoskeletal: Positive for arthralgias, back pain and myalgias. Negative for gait problem and neck pain.  Skin: Negative.   Psychiatric/Behavioral: Negative for dysphoric mood. The patient is not nervous/anxious.     Immunization History  Administered Date(s) Administered  . Influenza-Unspecified 09/15/2019  . Moderna Sars-Covid-2 Vaccination 09/15/2019  . PFIZER(Purple Top)SARS-COV-2 Vaccination 10/03/2019, 10/22/2019  . Tdap 05/26/2016   Pertinent  Health Maintenance Due  Topic Date Due  . DEXA SCAN  Never done  . PNA vac Low Risk Adult (1 of 2 - PCV13) Never done  . INFLUENZA VACCINE  04/14/2021   Fall Risk  10/23/2019 07/20/2017 03/30/2017 11/26/2016  Falls in the past year? 0 No No Yes  Number falls in past yr: - - - 1  Injury with Fall? - - - Yes   Functional Status Survey:    Vitals:   12/25/20 1337  BP: 134/74  Pulse: 70  Resp: 20  Temp: (!) 97 F (36.1 C)  SpO2: 95%  Weight: 161 lb 6.4 oz (73.2 kg)   Body mass index is 26.86 kg/m. Physical Exam Vitals reviewed.  Constitutional:      General: She is not in acute distress. Cardiovascular:     Rate and Rhythm: Normal rate and regular rhythm.     Pulses: Normal pulses.     Heart sounds: Normal heart sounds. No murmur heard.   Pulmonary:     Effort: Pulmonary effort is normal. No respiratory distress.     Breath sounds: Normal breath sounds. No wheezing.  Musculoskeletal:     Cervical back: Normal.     Thoracic  back: Tenderness present. No swelling or deformity. No scoliosis.     Lumbar back: Normal.       Back:     Right lower leg: No edema.     Left lower leg: Edema present.  Skin:    General: Skin is warm and dry.  Neurological:     General: No focal deficit present.     Mental Status: She is alert and oriented to person, place, and time.  Psychiatric:        Mood and Affect: Mood normal.        Behavior: Behavior normal.     Labs reviewed: Recent Labs    10/28/20 0000  NA 138  K 4.5  CL 106  CO2 23*  BUN 15  CREATININE 0.9  CALCIUM 9.1   Recent Labs    10/28/20  0000  AST 13  ALT 11  ALKPHOS 94  ALBUMIN 3.4*   Recent Labs    10/28/20 0000  WBC 5.3  HGB 12.9  HCT 38  PLT 268   Lab Results  Component Value Date   TSH 2.88 10/28/2020   No results found for: HGBA1C Lab Results  Component Value Date   CHOL  02/15/2010    184        ATP III CLASSIFICATION:  <200     mg/dL   Desirable  200-239  mg/dL   Borderline High  >=240    mg/dL   High          HDL 66 02/15/2010   LDLCALC (H) 02/15/2010    109        Total Cholesterol/HDL:CHD Risk Coronary Heart Disease Risk Table                     Men   Women  1/2 Average Risk   3.4   3.3  Average Risk       5.0   4.4  2 X Average Risk   9.6   7.1  3 X Average Risk  23.4   11.0        Use the calculated Patient Ratio above and the CHD Risk Table to determine the patient's CHD Risk.        ATP III CLASSIFICATION (LDL):  <100     mg/dL   Optimal  100-129  mg/dL   Near or Above                    Optimal  130-159  mg/dL   Borderline  160-189  mg/dL   High  >190     mg/dL   Very High   TRIG 45 02/15/2010   CHOLHDL 2.8 02/15/2010    Significant Diagnostic Results in last 30 days:  No results found.  Assessment/Plan 1. Chronic midline thoracic back pain - no observed curvature of spine, reports pain near bra line with certain movement - start tylenol 1000 mg po tid prn - start voltaren gel- apply to  mid back tid prn for pain    Family/ staff Communication: plan discussed with patient and nurse  Labs/tests ordered: none

## 2020-12-26 NOTE — Addendum Note (Signed)
Addended byWindell Moulding E on: 12/26/2020 10:11 AM   Modules accepted: Level of Service

## 2020-12-27 DIAGNOSIS — R4789 Other speech disturbances: Secondary | ICD-10-CM | POA: Diagnosis not present

## 2020-12-27 DIAGNOSIS — F0390 Unspecified dementia without behavioral disturbance: Secondary | ICD-10-CM | POA: Diagnosis not present

## 2020-12-31 DIAGNOSIS — R4789 Other speech disturbances: Secondary | ICD-10-CM | POA: Diagnosis not present

## 2020-12-31 DIAGNOSIS — F0390 Unspecified dementia without behavioral disturbance: Secondary | ICD-10-CM | POA: Diagnosis not present

## 2021-01-01 DIAGNOSIS — R4789 Other speech disturbances: Secondary | ICD-10-CM | POA: Diagnosis not present

## 2021-01-01 DIAGNOSIS — F0390 Unspecified dementia without behavioral disturbance: Secondary | ICD-10-CM | POA: Diagnosis not present

## 2021-01-03 DIAGNOSIS — Z23 Encounter for immunization: Secondary | ICD-10-CM | POA: Diagnosis not present

## 2021-01-22 ENCOUNTER — Ambulatory Visit: Payer: Medicare Other | Admitting: Adult Health

## 2021-03-14 DIAGNOSIS — Z20822 Contact with and (suspected) exposure to covid-19: Secondary | ICD-10-CM | POA: Diagnosis not present

## 2021-04-16 ENCOUNTER — Encounter: Payer: Self-pay | Admitting: Orthopedic Surgery

## 2021-04-16 ENCOUNTER — Non-Acute Institutional Stay: Payer: Medicare Other | Admitting: Orthopedic Surgery

## 2021-04-16 DIAGNOSIS — M810 Age-related osteoporosis without current pathological fracture: Secondary | ICD-10-CM | POA: Diagnosis not present

## 2021-04-16 DIAGNOSIS — G8929 Other chronic pain: Secondary | ICD-10-CM

## 2021-04-16 DIAGNOSIS — F0150 Vascular dementia without behavioral disturbance: Secondary | ICD-10-CM

## 2021-04-16 DIAGNOSIS — I10 Essential (primary) hypertension: Secondary | ICD-10-CM

## 2021-04-16 DIAGNOSIS — M546 Pain in thoracic spine: Secondary | ICD-10-CM | POA: Diagnosis not present

## 2021-04-16 DIAGNOSIS — J3089 Other allergic rhinitis: Secondary | ICD-10-CM | POA: Diagnosis not present

## 2021-04-16 DIAGNOSIS — K219 Gastro-esophageal reflux disease without esophagitis: Secondary | ICD-10-CM

## 2021-04-16 NOTE — Progress Notes (Signed)
Location:   [place] Room Number: [place] of Service:  ALF [phone]) Provider:  Windell Moulding, NP    Patient Care Team: Virgie Dad, MD as PCP - General (Internal Medicine) Pa, [facility] (Ophthalmology) Dermatology, [facility] (Dermatology)  Extended Emergency Contact Information Primary Emergency Contact: Arelia Sneddon [place] of [place] Phone: [phone] Mobile Phone: [phone] Relation: Daughter  Code Status:  DNR Goals of care: Advanced Directive information Advanced Directives 04/16/2021  Does Patient Have a Medical Advance Directive? No  Type of Advance Directive -  Does patient want to make changes to medical advance directive? -  Copy of [place] in Chart? -  Would patient like information on creating a medical advance directive? No - Patient declined     Chief Complaint  Patient presents with   Medical Management of Chronic Issues    Routine follow up    Health Maintenance    Discuss need for shingles vaccine and dexa scan    HPI:  Pt is a 87 y.o. female seen today for medical management of chronic diseases.    She currently resides on the assisted living unit at [facility]. Past medical history includes: HTN, venous insufficiency, GERD, diverticulitis, dementia, and HLD.   Dementia- MMSE 22/30 10/2020, MRI brain 2021 revealed cerebral atrophy with chronic small vessel ischemic disease, no recent behavioral outbursts, continues to have reminder signs in room about meal times and activities, ambulates and feeds self, namenda 10 mg bid HTN- BUN/creat 15/0.9 10/28/2020, remains off medication, regular diet Chronic back pain- reports issues with back since childhood, tylenol 1000 mg po tid, Voltaren gel tid GERD- hgb 12.9 10/28/2020, stable with prn Tums  Allergies- stable with Flonase daily Osteoporosis- last DEXA unknown, daughter reports osteoporosis, Dr. Sharlett Iles mentions osteoporosis in  08/2020 note, daughter reports she could not tolerate fosamax, requesting she be placed on Caltrate, does not want DEXA done in future  No recent falls or injuries.   Recent blood pressures:  07/27- 131/64  07/20- 126/81  07/13- 105/57  Recent weights:  0803- 153.2 lbs  04/01- 161.4 lbs  Nurse does not report any concerns, vital stable.   Past Medical History:  Diagnosis Date   Anxiety    Cataract    Chest pain 02/16/2010   Myoview, negative for pharmacologic-stress induced ischemia, EF 74   Complication of anesthesia 1954   hard to wake up   Diverticulosis    DUB (dysfunctional uterine bleeding)    Dysrhythmia    h/o PVCs- none recently- no meds   Fall    GERD (gastroesophageal reflux disease)    Hearing reduced    Hypertension    no meds currently   Loss of consciousness ([place])    Memory loss    Osteoarthritis of left hip    Osteoporosis    Palpitation 02/02/2013   A CardioNet MCOT (mobile cardiac outpatient telemetry ): NSR with PACs; sinus tachycardia with ventricular trigeminy. Brief atrial run. Facet cardiac; ventricular trigeminy and accelerated idioventricular rhythm. No significant tachyarrhythmias.   Palpitations    Postmenopausal bleeding 11/01/2014   Unsteady gait    Varicose veins of both legs with pain    Past Surgical History:  Procedure Laterality Date   APPENDECTOMY     CATARACT EXTRACTION     both eyes   CESAREAN SECTION  1962, 1963, [id number]   x 4   COLONOSCOPY  01/14/2017   DIAGNOSTIC LAPAROSCOPY     ECTOPIC  PREGNANCY SURGERY  1966   FRACTURE SURGERY     HIP SURGERY Right    HYSTEROSCOPY WITH D & C N/A 11/02/2014   Procedure: DILATATION AND CURETTAGE /HYSTEROSCOPY;  Surgeon: Janyth Contes, MD;  Location: [facility];  Service: Gynecology;  Laterality: N/A;   Lower Extremity Venous Dopplers  01/2013   No thrombus or thrombophlebitis. R Greater Saphenous Vein - enlarged without other insufficiency (6 mm); right and left Small Saphenous  Vein: Patent no valve insufficiency.   TONSILLECTOMY     TONSILLECTOMY AND ADENOIDECTOMY     TRANSTHORACIC ECHOCARDIOGRAM  01/2013   Normal LV size and function. EF 55-60%. Grade 1 diastolic dysfunction. Elevated LV filling pressures. No regional W. May.    Allergies  Allergen Reactions   Evista [Raloxifene]    Fosamax [Alendronate]    Penicillins    Polyoxyethylene Lauryl Ether [Sorbitan]    Moxifloxacin Rash    Allergies as of 04/16/2021       Reactions   Evista [raloxifene]    Fosamax [alendronate]    Penicillins    Polyoxyethylene Lauryl Ether [sorbitan]    Moxifloxacin Rash        Medication List        Accurate as of April 16, 2021 10:42 AM. If you have any questions, ask your nurse or doctor.          STOP taking these medications    olopatadine 0.1 % ophthalmic solution Commonly known as: PATANOL Stopped by: Yvonna Alanis, NP       TAKE these medications    acetaminophen 500 MG tablet Commonly known as: TYLENOL Take 1,000 mg by mouth 3 (three) times daily.   calcium carbonate 500 MG chewable tablet Commonly known as: TUMS - dosed in mg elemental calcium Chew 1 tablet by mouth 2 (two) times daily as needed for indigestion or heartburn.   fluticasone 50 MCG/ACT nasal spray Commonly known as: FLONASE Place 2 sprays into both nostrils daily.   memantine 10 MG tablet Commonly known as: NAMENDA Take 10 mg by mouth 2 (two) times daily.   MULTIPLE VITAMIN PO Take by mouth daily.   Voltaren 1 % Gel Generic drug: diclofenac Sodium Apply 1 g topically 3 (three) times daily.        Review of Systems  Unable to perform ROS: Dementia   Immunization History  Administered Date(s) Administered   Influenza, Quadrivalent, Recombinant, Inj, Pf 06/18/2018, 05/04/2019, 06/15/2020   Influenza-Unspecified 09/15/2019   Moderna Sars-Covid-2 Vaccination 09/15/2019   PFIZER(Purple Top)SARS-COV-2 Vaccination 10/03/2019, 10/22/2019, 09/03/2020   Pneumococcal  Conjugate-13 05/28/2014   Pneumococcal Polysaccharide-23 07/15/1998, 06/15/2005   Tdap 05/26/2016   Zoster, Live 05/18/2013, 05/28/2014   Pertinent  Health Maintenance Due  Topic Date Due   DEXA SCAN  Never done   INFLUENZA VACCINE  04/14/2021   PNA vac Low Risk Adult  Completed   Fall Risk  10/23/2019 07/20/2017 03/30/2017 11/26/2016  Falls in the past year? 0 No No Yes  Number falls in past yr: - - - 1  Injury with Fall? - - - Yes   Functional Status Survey:    Vitals:   04/16/21 1036  BP: 131/64  Pulse: 69  Resp: 19  Temp: (!) 97 F (36.1 C)  SpO2: 96%  Weight: 161 lb 6.4 oz (73.2 kg)  Height: '5\' 5"'$  (1.651 m)   Body mass index is 26.86 kg/m. Physical Exam Vitals reviewed.  Constitutional:      General: She is not in acute distress.  HENT:     Head: Normocephalic.     Right Ear: There is no impacted cerumen.     Left Ear: There is no impacted cerumen.     Nose: Nose normal.     Mouth/Throat:     Mouth: Mucous membranes are moist.  Eyes:     General:        Right eye: No discharge.        Left eye: No discharge.  Neck:     Vascular: No carotid bruit.  Cardiovascular:     Rate and Rhythm: Normal rate and regular rhythm.     Pulses: Normal pulses.     Heart sounds: Normal heart sounds. No murmur heard. Pulmonary:     Effort: Pulmonary effort is normal. No respiratory distress.     Breath sounds: Normal breath sounds. No wheezing.  Abdominal:     General: Bowel sounds are normal. There is no distension.     Palpations: Abdomen is soft.     Tenderness: There is no abdominal tenderness.  Musculoskeletal:     Cervical back: Normal range of motion.     Right lower leg: No edema.     Left lower leg: No edema.     Comments: Mild kyphosis observed  Lymphadenopathy:     Cervical: No cervical adenopathy.  Skin:    General: Skin is warm and dry.     Capillary Refill: Capillary refill takes less than 2 seconds.  Neurological:     General: No focal deficit present.      Mental Status: She is alert. Mental status is at baseline.     Motor: Weakness present.     Gait: Gait abnormal.  Psychiatric:        Mood and Affect: Mood normal.        Behavior: Behavior normal.        Cognition and Memory: Memory is impaired.    Labs reviewed: Recent Labs    08/27/20 0000 10/28/20 0000  NA 139 138  K 4.5 4.5  CL 104 106  CO2 23* 23*  BUN 14 15  CREATININE 0.8 0.9  CALCIUM 9.8 9.1   Recent Labs    08/27/20 0000 10/28/20 0000  AST 18 13  ALT 15 11  ALKPHOS 106 94  ALBUMIN 3.9 3.4*   Recent Labs    08/27/20 0000 10/28/20 0000  WBC 10.9 5.3  NEUTROABS 8.60  --   HGB 15.0 12.9  HCT 46 38  PLT 279 268   Lab Results  Component Value Date   TSH 2.88 10/28/2020   No results found for: HGBA1C Lab Results  Component Value Date   CHOL 156 08/27/2020   HDL 69 08/27/2020   LDLCALC 75 08/27/2020   TRIG 62 08/27/2020   CHOLHDL 2.8 02/15/2010    Significant Diagnostic Results in last 30 days:  No results found.  Assessment/Plan 1. Vascular dementia without behavioral disturbance ([place]) - MMSE 20/30 11/2020 - MRI brain 2021 revealed cerebral atrophy with chronic small vessel ischemic disease - no recent behavioral outbursts - cont namenda 10 mg bid  2. Essential hypertension - labile - controlled without medication - cbc/diff - cmp  3. Gastroesophageal reflux disease without esophagitis - hgb 12.9 10/28/2020 - cont tums prn  4. Non-seasonal allergic rhinitis due to other allergic trigger - stable with flonase daily  5. Chronic midline thoracic back pain - mild kyphosis noted - cont tylenol 1000 mg tid -cont voltaren gel 1% tid  6.  Age-related osteoporosis without current pathological fracture - last DEXA scan unknown, daughter does not wish for future studies - Previous PCP noted osteoporosis in past notes, t-score unknown - daughter reports she did not like fosamax in past - will start Caltrate 600 mg/800 iu po daily for  bone health    Family/ staff Communication: plan discussed with patient, daughter and nurse  Labs/tests ordered:  cbc/diff, cmp

## 2021-04-17 DIAGNOSIS — F0390 Unspecified dementia without behavioral disturbance: Secondary | ICD-10-CM | POA: Diagnosis not present

## 2021-04-17 DIAGNOSIS — D649 Anemia, unspecified: Secondary | ICD-10-CM | POA: Diagnosis not present

## 2021-04-17 LAB — COMPREHENSIVE METABOLIC PANEL
Albumin: 5.3 — AB (ref 3.5–5.0)
Calcium: 8.9 (ref 8.7–10.7)
Globulin: 1.9

## 2021-04-17 LAB — HEPATIC FUNCTION PANEL
ALT: 10 (ref 7–35)
AST: 13 (ref 13–35)
Alkaline Phosphatase: 82 (ref 25–125)
Bilirubin, Total: 0.5

## 2021-04-17 LAB — BASIC METABOLIC PANEL
BUN: 21 (ref 4–21)
CO2: 24 — AB (ref 13–22)
Chloride: 109 — AB (ref 99–108)
Creatinine: 0.9 (ref 0.5–1.1)
Glucose: 77
Potassium: 4.2 (ref 3.4–5.3)
Sodium: 140 (ref 137–147)

## 2021-04-17 LAB — CBC: RBC: 4.07 (ref 3.87–5.11)

## 2021-04-17 LAB — CBC AND DIFFERENTIAL
HCT: 37 (ref 36–46)
Hemoglobin: 12.6 (ref 12.0–16.0)
Neutrophils Absolute: 2277
Platelets: 230 (ref 150–399)
WBC: 4.6

## 2021-05-14 ENCOUNTER — Non-Acute Institutional Stay (INDEPENDENT_AMBULATORY_CARE_PROVIDER_SITE_OTHER): Payer: Medicare Other | Admitting: Orthopedic Surgery

## 2021-05-14 ENCOUNTER — Encounter: Payer: Self-pay | Admitting: Orthopedic Surgery

## 2021-05-14 DIAGNOSIS — Z0000 Encounter for general adult medical examination without abnormal findings: Secondary | ICD-10-CM

## 2021-05-14 NOTE — Patient Instructions (Signed)
  Melissa Valenzuela , Thank you for taking time to come for your Medicare Wellness Visit. I appreciate your ongoing commitment to your health goals. Please review the following plan we discussed and let me know if I can assist you in the future.   These are the goals we discussed:  Goals      Maintain Mobility and Function     Evidence-based guidance:  Emphasize the importance of physical activity and aerobic exercise as included in treatment plan; assess barriers to adherence; consider patient's abilities and preferences.  Encourage gradual increase in activity or exercise instead of stopping if pain occurs.  Reinforce individual therapy exercise prescription, such as strengthening, stabilization and stretching programs.  Promote optimal body mechanics to stabilize the spine with lifting and functional activity.  Encourage activity and mobility modifications to facilitate optimal function, such as using a log roll for bed mobility or dressing from a seated position.  Reinforce individual adaptive equipment recommendations to limit excessive spinal movements, such as a Systems analyst.  Assess adequacy of sleep; encourage use of sleep hygiene techniques, such as bedtime routine; use of white noise; dark, cool bedroom; avoiding daytime naps, heavy meals or exercise before bedtime.  Promote positions and modification to optimize sleep and sexual activity; consider pillows or positioning devices to assist in maintaining neutral spine.  Explore options for applying ergonomic principles at work and home, such as frequent position changes, using ergonomically designed equipment and working at optimal height.  Promote modifications to increase comfort with driving such as lumbar support, optimizing seat and steering wheel position, using cruise control and taking frequent rest stops to stretch and walk.   Notes:         This is a list of the screening recommended for you and due dates:  Health  Maintenance  Topic Date Due   Zoster (Shingles) Vaccine (1 of 2) Never done   Flu Shot  04/14/2021   DEXA scan (bone density measurement)  04/16/2022*   Tetanus Vaccine  05/26/2026   COVID-19 Vaccine  Completed   Pneumonia vaccines  Completed   HPV Vaccine  Aged Out  *Topic was postponed. The date shown is not the original due date.

## 2021-05-14 NOTE — Progress Notes (Signed)
Location:  [place] Room Number: [place] of Service:  SNF (31) Provider:   Patient Care Team: Virgie Dad, MD as PCP - General (Internal Medicine) Pa, [facility] (Ophthalmology) Dermatology, [facility] (Dermatology)  Extended Emergency Contact Information Primary Emergency Contact: Arelia Sneddon States of [place] Phone: [phone] Mobile Phone: [phone] Relation: Daughter  Code Status: DNR Goals of Care: Advanced Directive information Advanced Directives 05/14/2021  Does Patient Have a Medical Advance Directive? Yes  Type of Paramedic of [place];Living will;Out of facility DNR (pink MOST or yellow form)  Does patient want to make changes to medical advance directive? No - Patient declined  Copy of [place] in Chart? No - copy requested  Would patient like information on creating a medical advance directive? -     Chief Complaint  Patient presents with   Medicare Wellness    Annual Wellness Visit.   Immunizations    Discuss the need for shingirx vaccine, and influenza vaccine.    HPI: Patient is a 87 y.o. female seen in today for an annual wellness exam.    No flowsheet data found.  Fall Risk  10/23/2019 07/20/2017 03/30/2017 11/26/2016  Falls in the past year? 0 No No Yes  Number falls in past yr: - - - 1  Injury with Fall? - - - Yes   MMSE - [place] Exam 07/25/2020 01/22/2020 10/23/2019  Orientation to time '2 1 3  '$ Orientation to Place '5 5 3  '$ Registration '3 3 3  '$ Attention/ Calculation '3 1 5  '$ Attention/Calculation-comments - - couldnt do numbers  Recall 0 0 0  Language- name 2 objects '2 2 2  '$ Language- repeat '1 1 1  '$ Language- follow 3 step command '3 3 3  '$ Language- read & follow direction '1 1 1  '$ Write a sentence '1 1 1  '$ Copy design '1 1 1  '$ Copy design-comments - 10 animals -  Total score '22 19 23     '$ Health Maintenance  Topic Date Due   Zoster Vaccines-  Shingrix (1 of 2) Never done   INFLUENZA VACCINE  04/14/2021   DEXA SCAN  04/16/2022 (Originally 03/06/1998)   TETANUS/TDAP  05/26/2026   COVID-19 Vaccine  Completed   PNA vac Low Risk Adult  Completed   HPV VACCINES  Aged Out    Urinary incontinence? Functional Status Survey:   Exercise? Diet? No results found. Hearing:   Dentition: Pain:  Past Medical History:  Diagnosis Date   Anxiety    Cataract    Chest pain 02/16/2010   Myoview, negative for pharmacologic-stress induced ischemia, EF 74   Complication of anesthesia 1954   hard to wake up   Diverticulosis    DUB (dysfunctional uterine bleeding)    Dysrhythmia    h/o PVCs- none recently- no meds   Fall    GERD (gastroesophageal reflux disease)    Hearing reduced    Hypertension    no meds currently   Loss of consciousness ([place])    Memory loss    Osteoarthritis of left hip    Osteoporosis    Palpitation 02/02/2013   A CardioNet MCOT (mobile cardiac outpatient telemetry ): NSR with PACs; sinus tachycardia with ventricular trigeminy. Brief atrial run. Facet cardiac; ventricular trigeminy and accelerated idioventricular rhythm. No significant tachyarrhythmias.   Palpitations    Postmenopausal bleeding 11/01/2014   Unsteady gait    Varicose veins of both legs with pain  Past Surgical History:  Procedure Laterality Date   APPENDECTOMY     CATARACT EXTRACTION     both eyes   CESAREAN SECTION  1962, 1963, 1964,1969   x 4   COLONOSCOPY  01/14/2017   DIAGNOSTIC LAPAROSCOPY     ECTOPIC PREGNANCY SURGERY  1966   FRACTURE SURGERY     HIP SURGERY Right    HYSTEROSCOPY WITH D & C N/A 11/02/2014   Procedure: DILATATION AND CURETTAGE /HYSTEROSCOPY;  Surgeon: Janyth Contes, MD;  Location: [facility];  Service: Gynecology;  Laterality: N/A;   Lower Extremity Venous Dopplers  01/2013   No thrombus or thrombophlebitis. R Greater Saphenous Vein - enlarged without other insufficiency (6 mm); right and left Small  Saphenous Vein: Patent no valve insufficiency.   TONSILLECTOMY     TONSILLECTOMY AND ADENOIDECTOMY     TRANSTHORACIC ECHOCARDIOGRAM  01/2013   Normal LV size and function. EF 55-60%. Grade 1 diastolic dysfunction. Elevated LV filling pressures. No regional W. May.    Family History  Problem Relation Age of Onset   Stroke Mother 58   Hypertension Mother    Colon cancer Mother 39   Hypertension Father    Stroke Father        2   Dementia Father    Dementia Sister    Hypertension Son 74       Started on BP medicine age [age]    Social History   Socioeconomic History   Marital status: Widowed    Spouse name: Not on file   Number of children: Not on file   Years of education: Not on file   Highest education level: Not on file  Occupational History   Not on file  Tobacco Use   Smoking status: Never   Smokeless tobacco: Never  Vaping Use   Vaping Use: Never used  Substance and Sexual Activity   Alcohol use: Yes    Alcohol/week: 7.0 standard drinks    Types: 7 Standard drinks or equivalent per week    Comment: occasionally   Drug use: No   Sexual activity: Not on file  Other Topics Concern   Not on file  Social History Narrative   [place]ob[place]-None Never a tobacco user.   Alcohol use <1 drink per week.    Regular diet.    Patient does consume caffeine-Coffee and tea.   Marital status-Widow, married since 89.   Patient lives alone. No pets.    Past profession housewife, [employer].   No exercise.    No DNR form and does not want to discuss one.    Patient has a HPOA          Social Determinants of Radio broadcast assistant Strain: Not on file  Food Insecurity: Not on file  Transportation Needs: Not on file  Physical Activity: Not on file  Stress: Not on file  Social Connections: Not on file    reports that she has never smoked. She has never used smokeless tobacco. She reports current alcohol use of about 7.0 standard drinks per week. She reports that  she does not use drugs.   Allergies  Allergen Reactions   Evista [Raloxifene]    Fosamax [Alendronate]    Penicillins    Polyoxyethylene Lauryl Ether [Sorbitan]    Moxifloxacin Rash    Outpatient Encounter Medications as of 05/14/2021  Medication Sig   acetaminophen (TYLENOL) 500 MG tablet Take 1,000 mg by mouth 3 (three) times daily.   Calcium Carbonate (  CALCIUM 600 PO) Take 1 tablet by mouth daily.   calcium carbonate (TUMS - DOSED IN MG ELEMENTAL CALCIUM) 500 MG chewable tablet Chew 1 tablet by mouth 2 (two) times daily as needed for indigestion or heartburn.   diclofenac Sodium (VOLTAREN) 1 % GEL Apply 1 g topically 3 (three) times daily.   fluticasone (FLONASE) 50 MCG/ACT nasal spray Place 2 sprays into both nostrils daily.   memantine (NAMENDA) 10 MG tablet Take 10 mg by mouth 2 (two) times daily.   MULTIPLE VITAMIN PO Take by mouth daily.   No facility-administered encounter medications on file as of 05/14/2021.     Review of Systems:  Review of Systems  Physical Exam: Vitals:   05/14/21 1058  BP: 123/81  Pulse: 82  Resp: 18  Temp: (!) 97 F (36.1 C)  SpO2: 96%  Weight: 153 lb 3.2 oz (69.5 kg)  Height: '5\' 5"'$  (1.651 m)   Body mass index is 25.49 kg/m. Physical Exam  Labs reviewed: Basic Metabolic Panel: Recent Labs    08/27/20 0000 10/28/20 0000  NA 139 138  K 4.5 4.5  CL 104 106  CO2 23* 23*  BUN 14 15  CREATININE 0.8 0.9  CALCIUM 9.8 9.1  TSH  --  2.88   Liver Function Tests: Recent Labs    08/27/20 0000 10/28/20 0000  AST 18 13  ALT 15 11  ALKPHOS 106 94  ALBUMIN 3.9 3.4*   No results for input(s): LIPASE, AMYLASE in the last 8760 hours. No results for input(s): AMMONIA in the last 8760 hours. CBC: Recent Labs    08/27/20 0000 10/28/20 0000  WBC 10.9 5.3  NEUTROABS 8.60  --   HGB 15.0 12.9  HCT 46 38  PLT 279 268   Lipid Panel: Recent Labs    08/27/20 0000  CHOL 156  HDL 69  LDLCALC 75  TRIG 62   No results found for:  HGBA1C  Procedures: No results found.  Assessment/Plan There are no diagnoses linked to this encounter.   Labs/tests ordered:  * No order type specified * Next appt:  '@NEXTENCTHIS'$  DEPT@

## 2021-05-14 NOTE — Progress Notes (Signed)
Subjective:   Melissa Valenzuela is a 87 y.o. female who presents for Medicare Annual (Subsequent) preventive examination.  Review of Systems     Cardiac Risk Factors include: advanced age (>53mn, >>77women);sedentary lifestyle;hypertension     Objective:    Today's Vitals   05/14/21 1058  BP: 123/81  Pulse: 82  Resp: 18  Temp: (!) 97 F (36.1 C)  SpO2: 96%  Weight: 153 lb 3.2 oz (69.5 kg)  Height: '5\' 5"'$  (1.651 m)   Body mass index is 25.49 kg/m.  Advanced Directives 05/14/2021 04/16/2021 11/03/2018 11/07/2014 10/30/2014  Does Patient Have a Medical Advance Directive? Yes No No Yes Yes  Type of [place]aramed[place]of [place];Out of facility DNR (pink MOST or yellow form) - - Healthcare Power of [place] Does patient want to make changes to medical advance directive? No - Patient declined - - No - Patient declined -  Copy of [place] Chart? No - copy requested - - No - copy requested -  Would patient like information on creating a medical advance directive? - No - Patient declined No - Patient declined - -    Current Medications (verified) Outpatient Encounter Medications as of 05/14/2021  Medication Sig   acetaminophen (TYLENOL) 500 MG tablet Take 1,000 mg by mouth 3 (three) times daily.   Calcium Carbonate (CALCIUM 600 PO) Take 1 tablet by mouth daily.   calcium carbonate (TUMS - DOSED IN MG ELEMENTAL CALCIUM) 500 MG chewable tablet Chew 1 tablet by mouth 2 (two) times daily as needed for indigestion or heartburn.   diclofenac Sodium (VOLTAREN) 1 % GEL Apply 1 g topically 3 (three) times daily.   fluticasone (FLONASE) 50 MCG/ACT nasal spray Place 2 sprays into both nostrils daily.   memantine (NAMENDA) 10 MG tablet Take 10 mg by mouth 2 (two) times daily.   MULTIPLE VITAMIN PO Take by mouth daily.   No facility-administered encounter medications on file as of 05/14/2021.    Allergies  (verified) Evista [raloxifene], Fosamax [alendronate], Penicillins, Polyoxyethylene lauryl ether [sorbitan], and Moxifloxacin   History: Past Medical History:  Diagnosis Date   Anxiety    Cataract    Chest pain 02/16/2010   Myoview, negative for pharmacologic-stress induced ischemia, EF 74   Complication of anesthesia 1954   hard to wake up   Diverticulosis    DUB (dysfunctional uterine bleeding)    Dysrhythmia    h/o PVCs- none recently- no meds   Fall    GERD (gastroesophageal reflux disease)    Hearing reduced    Hypertension    no meds currently   [place] [facility] [place] [place]    Memory loss    Osteoarthritis of left hip    Osteoporosis    Palpitation 02/02/2013   A CardioNet MCOT (mobile cardiac outpatient telemetry ): NSR with PACs; sinus tachycardia with ventricular trigeminy. Brief atrial run. Facet cardiac; ventricular trigeminy and accelerated idioventricular rhythm. No significant tachyarrhythmias.   Palpitations    Postmenopausal bleeding 11/01/2014   Unsteady gait    Varicose veins of both legs with pain    Past Surgical History:  Procedure Laterality Date   APPENDECTOMY     CATARACT EXTRACTION     both eyes   CESAREAN SECTION  1962, 1963, [id number]  x 4   COLONOSCOPY  01/14/2017   DIAGNOSTIC LAPAROSCOPY     ECTOPIC PREGNANCY SURGERY  1966   FRACTURE SURGERY  HIP SURGERY Right    HYSTEROSCOPY WITH D & C N/A 11/02/2014   Procedure: DILATATION AND CURETTAGE /HYSTEROSCOPY;  Surgeon: Janyth Contes, MD;  Location: [facility];  Service: Gynecology;  Laterality: N/A;   Lower Extremity Venous Dopplers  01/2013   No thrombus or thrombophlebitis. R Greater Saphenous Vein - enlarged without other insufficiency (6 mm); right and left Small Saphenous Vein: Patent no valve insufficiency.   TONSILLECTOMY     TONSILLECTOMY AND ADENOIDECTOMY     TRANSTHORACIC ECHOCARDIOGRAM  01/2013   Normal LV size and function. EF 55-60%. Grade 1 diastolic dysfunction. Elevated  LV filling pressures. No regional W. May.   Family History  Problem Relation Age of Onset   Stroke Mother 23   Hypertension Mother    Colon cancer Mother 26   Hypertension Father    Stroke Father        2   Dementia Father    Dementia Sister    Hypertension Son 49       Started on BP medicine age [age]   Social History   Socioeconomic History   Marital status: Widowed    Spouse name: Not on file   Number of children: Not on file   Years of education: Not on file   Highest education level: Not on file  Occupational History   Not on file  Tobacco Use   Smoking status: Never   Smokeless tobacco: Never  Vaping Use   Vaping Use: Never used  Substance and Sexual Activity   Alcohol use: Yes    Alcohol/week: 7.0 standard drinks    Types: 7 Standard drinks or equivalent per week    Comment: occasionally   Drug use: No   Sexual activity: Not on file  Other Topics Concern   Not on file  Social History Narrative   Tobaccco-None Never a tobacco user.   Alcohol use <1 drink per week.    Regular diet.    Patient does consume caffeine-Coffee and tea.   Marital status-Widow, married since 75.   Patient lives alone. No pets.    Past profession housewife, [employer].   No exercise.    No DNR form and does not want to discuss one.    Patient has a HPOA          Social Determinants of Radio broadcast assistant Strain: Low Risk    Difficulty of Paying Living Expenses: Not hard at all  Food Insecurity: No Food Insecurity   Worried About Charity fundraiser in the Last Year: Never true   [place] in the Last Year: Never true  Transportation Needs: No Transportation Needs   Lack of Transportation (Medical): No   Lack of Transportation (Non-Medical): No  Physical Activity: Inactive   Days of Exercise per Week: 0 days   Minutes of Exercise per Session: 0 min  Stress: No Stress Concern Present   Feeling of Stress : Not at all  Social Connections: Socially  Isolated   Frequency of Communication with Friends and Family: Three times a week   Frequency of Social Gatherings with Friends and Family: More than three times a week   Attends Religious Services: Never   Marine scientist or Organizations: No   Attends Archivist Meetings: Never   Marital Status: Widowed    Tobacco Counseling Counseling given: Not Answered   Clinical Intake:  Pre-visit preparation completed: Yes  Pain : No/denies pain  BMI - recorded: 25.49 Nutritional Status: BMI 25 -29 Overweight Nutritional Risks: None Diabetes: No  How often do you need to have someone help you when you read instructions, pamphlets, or other written materials from your doctor or pharmacy?: 5 - Always What is the last grade level you completed in school?: 2 year college  Diabetic?No  Interpreter Needed?: No      Activities of Daily Living In your present state of health, do you have any difficulty performing the following activities: 05/14/2021  Hearing? N  Vision? N  Difficulty concentrating or making decisions? Y  Walking or climbing stairs? Y  Dressing or bathing? N  Doing errands, shopping? Y  Preparing Food and eating ? Y  Using the Toilet? N  In the past six months, have you accidently leaked urine? N  Do you have problems with loss of bowel control? N  Managing your Medications? Y  Managing your Finances? Y  Housekeeping or managing your Housekeeping? Y  Some recent data might be hidden    Patient Care Team: Virgie Dad, MD as PCP - General (Internal Medicine) Pa, [facility] (Ophthalmology) Dermatology, [facility] (Dermatology)  Indicate any recent Medical Services you may have received from other than [facility] providers in the past year (date may be approximate).     Assessment:   This is a routine wellness examination for Melissa Valenzuela.  Hearing/Vision screen No results found.  Dietary issues and exercise activities  discussed: Current Exercise Habits: The patient does not participate in regular exercise at present, Exercise limited by: Other - see comments (dementia)   Goals Addressed             This Visit's Progress    Maintain Mobility and Function   On track    Evidence-based guidance:  Emphasize the importance of physical activity and aerobic exercise as included in treatment plan; assess barriers to adherence; consider patient's abilities and preferences.  Encourage gradual increase in activity or exercise instead of stopping if pain occurs.  Reinforce individual therapy exercise prescription, such as strengthening, stabilization and stretching programs.  Promote optimal body mechanics to stabilize the spine with lifting and functional activity.  Encourage activity and mobility modifications to facilitate optimal function, such as using a log roll for bed mobility or dressing from a seated position.  Reinforce individual adaptive equipment recommendations to limit excessive spinal movements, such as a Systems analyst.  Assess adequacy of sleep; encourage use of sleep hygiene techniques, such as bedtime routine; use of white noise; dark, cool bedroom; avoiding daytime naps, heavy meals or exercise before bedtime.  Promote positions and modification to optimize sleep and sexual activity; consider pillows or positioning devices to assist in maintaining neutral spine.  Explore options for applying ergonomic principles at work and home, such as frequent position changes, using ergonomically designed equipment and working at optimal height.  Promote modifications to increase comfort with driving such as lumbar support, optimizing seat and steering wheel position, using cruise control and taking frequent rest stops to stretch and walk.   Notes:        Depression Screen PHQ 2/9 Scores 05/14/2021  PHQ - 2 Score 0    Fall Risk Fall Risk  05/14/2021 10/23/2019 07/20/2017 03/30/2017 11/26/2016  Falls in  the past year? 1 0 No No Yes  Number falls in past yr: 0 - - - 1  Injury with Fall? 0 - - - Yes  Risk for fall due to : History of fall(s);Impaired balance/gait - - - -  Follow up Falls evaluation completed;Education provided;Falls prevention discussed - - - -    FALL RISK PREVENTION PERTAINING TO THE HOME:  Any stairs in or around the home? No  If so, are there any without handrails? No  Home free of loose throw rugs in walkways, pet beds, electrical cords, etc? Yes  Adequate lighting in your home to reduce risk of falls? Yes   ASSISTIVE DEVICES UTILIZED TO PREVENT FALLS:  Life alert? No  Use of a cane, walker or w/c? Yes  Grab bars in the bathroom? Yes  Shower chair or bench in shower? Yes  Elevated toilet seat or a handicapped toilet? Yes   TIMED UP AND GO:  Was the test performed? No .  Length of time to ambulate 10 feet: N/A sec.   Gait slow and steady with assistive device  Cognitive Function: MMSE - Mini Mental State Exam 05/14/2021 07/25/2020 01/22/2020 10/23/2019  Not completed: Refused - - -  Orientation to time - '2 1 3  '$ Orientation to Place - '5 5 3  '$ Registration - '3 3 3  '$ Attention/ Calculation - '3 1 5  '$ Attention/Calculation-comments - - - couldnt do numbers  Recall - 0 0 0  Language- name 2 objects - '2 2 2  '$ Language- repeat - '1 1 1  '$ Language- follow 3 step command - '3 3 3  '$ Language- read & follow direction - '1 1 1  '$ Write a sentence - '1 1 1  '$ Copy design - '1 1 1  '$ Copy design-comments - - 10 animals -  Total score - '22 19 23     '$ 6CIT Screen 05/14/2021  What Year? 4 points  What month? 3 points  What time? 3 points  Count back from 20 2 points  Months in reverse 2 points  Repeat phrase 6 points  Total Score 20    Immunizations Immunization History  Administered Date(s) Administered   Influenza, Quadrivalent, Recombinant, Inj, Pf 06/18/2018, 05/04/2019, 06/15/2020   Influenza-Unspecified 09/15/2019   Moderna Sars-Covid-2 Vaccination 09/15/2019    PFIZER(Purple Top)SARS-COV-2 Vaccination 10/03/2019, 10/22/2019, 09/03/2020   Pneumococcal Conjugate-13 05/28/2014   Pneumococcal Polysaccharide-23 07/15/1998, 06/15/2005   Tdap 05/26/2016   Zoster, Live 05/18/2013, 05/28/2014    TDAP status: Up to date  Flu Vaccine status: Due, Education has been provided regarding the importance of this vaccine. Advised may receive this vaccine at local pharmacy or Health Dept. Aware to provide a copy of the vaccination record if obtained from local pharmacy or Health Dept. Verbalized acceptance and understanding.  Pneumococcal vaccine status: Up to date  Covid-19 vaccine status: Completed vaccines  Qualifies for Shingles Vaccine? Yes   Zostavax completed Yes   Shingrix Completed?: No.    Education has been provided regarding the importance of this vaccine. Patient has been advised to call insurance company to determine out of pocket expense if they have not yet received this vaccine. Advised may also receive vaccine at local pharmacy or Health Dept. Verbalized acceptance and understanding.  Screening Tests Health Maintenance  Topic Date Due   Zoster Vaccines- Shingrix (1 of 2) Never done   INFLUENZA VACCINE  04/14/2021   DEXA SCAN  04/16/2022 (Originally 03/06/1998)   TETANUS/TDAP  05/26/2026   COVID-19 Vaccine  Completed   PNA vac Low Risk Adult  Completed   HPV VACCINES  Aged Out    Health Maintenance  Health Maintenance Due  Topic Date Due   Zoster Vaccines- Shingrix (1 of 2) Never done   INFLUENZA VACCINE  04/14/2021  Colorectal cancer screening: No longer required.   Mammogram status: No longer required due to advanced age.  Bone Density status: Completed unknown. Results reflect: Bone density results: NORMAL. Repeat every unknown years.  Lung Cancer Screening: (Low Dose CT Chest recommended if Age 87-80 years, 30 pack-year currently smoking OR have quit w/in 15years.) does not qualify.   Lung Cancer Screening Referral:  No  Additional Screening:  Hepatitis C Screening: does not qualify; Completed not completed  Vision Screening: Recommended annual ophthalmology exams for early detection of glaucoma and other disorders of the eye. Is the patient up to date with their annual eye exam?  Yes  Who is the provider or what is the name of the office in which the patient attends annual eye exams? N/A If pt is not established with a provider, would they like to be referred to a provider to establish care? No .   Dental Screening: Recommended annual dental exams for proper oral hygiene  Community Resource Referral / Chronic Care Management: CRR required this visit?  No   CCM required this visit?  No      Plan:     I have personally reviewed and noted the following in the patient's chart:   Medical and social history Use of alcohol, tobacco or illicit drugs  Current medications and supplements including opioid prescriptions.  Functional ability and status Nutritional status Physical activity Advanced directives List of other physicians Hospitalizations, surgeries, and ER visits in previous 12 months Vitals Screenings to include cognitive, depression, and falls Referrals and appointments  In addition, I have reviewed and discussed with patient certain preventive protocols, quality metrics, and best practice recommendations. A written personalized care plan for preventive services as well as general preventive health recommendations were provided to patient.     Yvonna Alanis, NP   05/14/2021

## 2021-06-04 DIAGNOSIS — Z23 Encounter for immunization: Secondary | ICD-10-CM | POA: Diagnosis not present

## 2021-07-31 ENCOUNTER — Non-Acute Institutional Stay: Payer: Medicare Other | Admitting: Internal Medicine

## 2021-07-31 DIAGNOSIS — F0150 Vascular dementia without behavioral disturbance: Secondary | ICD-10-CM | POA: Diagnosis not present

## 2021-07-31 DIAGNOSIS — M810 Age-related osteoporosis without current pathological fracture: Secondary | ICD-10-CM | POA: Diagnosis not present

## 2021-07-31 DIAGNOSIS — I10 Essential (primary) hypertension: Secondary | ICD-10-CM | POA: Diagnosis not present

## 2021-08-01 ENCOUNTER — Encounter: Payer: Self-pay | Admitting: Internal Medicine

## 2021-08-01 NOTE — Progress Notes (Signed)
Location:   [facility] Room Number: 6 Place of Service:  ALF [phone]) Provider:  Veleta Miners MD  Virgie Dad, MD  Patient Care Team: Virgie Dad, MD as PCP - General (Internal Medicine) [place], [facility] (Ophthalmology) Dermatology, [facility] (Dermatology)  Extended Emergency Contact Information Primary Emergency Contact: Arelia Sneddon [place] Phone: [phone] Mobile Phone: [phone] Relation: Daughter  Code Status:  DNR Goals of care: Advanced Directive information Advanced Directives 05/14/2021  Does Patient Have a Medical Advance Directive? Yes  Type of Paramedic of [place];Living will;Out of facility DNR (pink MOST or yellow form)  Does patient want to make changes to medical advance directive? No - Patient declined  Copy of [place] in Chart? No - copy requested  Would patient like information on creating a medical advance directive? -     Chief Complaint  Patient presents with   Medical Management of Chronic Issues    HPI:  Pt is a 87 y.o. female seen today for an Routine Visit  Has h/o Dementia, HLD,Hypertension,  Diverticulitis and GERD  Continues to stay stable No New issues today No Falls. Walks with her walker No Behavior issues Interacts with other residents Mood stable Has lost some weight but appetite is good Past Medical History:  Diagnosis Date   Anxiety    Cataract    Chest pain 02/16/2010   Myoview, negative for pharmacologic-stress induced ischemia, EF 74   Complication of anesthesia 1954   hard to wake up   Diverticulosis    DUB (dysfunctional uterine bleeding)    Dysrhythmia    h/o PVCs- none recently- no meds   Fall    GERD (gastroesophageal reflux disease)    Hearing reduced    Hypertension    no meds currently   Loss of consciousness ([place])    Memory loss    Osteoarthritis of left hip    Osteoporosis    Palpitation  02/02/2013   A CardioNet MCOT (mobile cardiac outpatient telemetry ): NSR with PACs; sinus tachycardia with ventricular trigeminy. Brief atrial run. Facet cardiac; ventricular trigeminy and accelerated idioventricular rhythm. No significant tachyarrhythmias.   Palpitations    Postmenopausal bleeding 11/01/2014   Unsteady gait    Varicose veins of both legs with pain    Past Surgical History:  Procedure Laterality Date   APPENDECTOMY     CATARACT EXTRACTION     both eyes   CESAREAN SECTION  1962, 1963, [id number]   x 4   COLONOSCOPY  01/14/2017   DIAGNOSTIC LAPAROSCOPY     ECTOPIC PREGNANCY SURGERY  1966   FRACTURE SURGERY     HIP SURGERY Right    HYSTEROSCOPY WITH D & C N/A 11/02/2014   Procedure: DILATATION AND CURETTAGE /HYSTEROSCOPY;  Surgeon: Janyth Contes, MD;  Location: [facility];  Service: Gynecology;  Laterality: N/A;   Lower Extremity Venous Dopplers  01/2013   No thrombus or thrombophlebitis. R Greater Saphenous Vein - enlarged without other insufficiency (6 mm); right and left Small Saphenous Vein: Patent no valve insufficiency.   TONSILLECTOMY     TONSILLECTOMY AND ADENOIDECTOMY     TRANSTHORACIC ECHOCARDIOGRAM  01/2013   Normal LV size and function. EF 55-60%. Grade 1 diastolic dysfunction. Elevated LV filling pressures. No regional W. May.    Allergies  Allergen Reactions   Evista [Raloxifene]    Fosamax [Alendronate]    Penicillins    Polyoxyethylene Lauryl Ether [Sorbitan]  Moxifloxacin Rash    Allergies as of 07/31/2021       Reactions   Evista [raloxifene]    Fosamax [alendronate]    Penicillins    Polyoxyethylene Lauryl Ether [sorbitan]    Moxifloxacin Rash        Medication List        Accurate as of July 31, 2021 11:59 PM. If you have any questions, ask your nurse or doctor.          acetaminophen 500 MG tablet Commonly known as: TYLENOL Take 1,000 mg by mouth 3 (three) times daily.   CALCIUM 600 PO Take 1 tablet by mouth  daily.   calcium carbonate 500 MG chewable tablet Commonly known as: TUMS - dosed in mg elemental calcium Chew 1 tablet by mouth 2 (two) times daily as needed for indigestion or heartburn.   diclofenac Sodium 1 % Gel Commonly known as: VOLTAREN Apply 1 g topically 3 (three) times daily.   fluticasone 50 MCG/ACT nasal spray Commonly known as: FLONASE Place 2 sprays into both nostrils daily.   loperamide 2 MG tablet Commonly known as: IMODIUM A-D Take 2 mg by mouth as needed for diarrhea or loose stools.   memantine 10 MG tablet Commonly known as: NAMENDA Take 10 mg by mouth 2 (two) times daily.   MULTIPLE VITAMIN PO Take by mouth daily.        Review of Systems Denied everything Review of Systems  Constitutional: Negative for activity change, appetite change, chills, diaphoresis, fatigue and fever.  HENT: Negative for mouth sores, postnasal drip, rhinorrhea, sinus pain and sore throat.   Respiratory: Negative for apnea, cough, chest tightness, shortness of breath and wheezing.   Cardiovascular: Negative for chest pain, palpitations and leg swelling.  Gastrointestinal: Negative for abdominal distention, abdominal pain, constipation, diarrhea, nausea and vomiting.  Genitourinary: Negative for dysuria and frequency.  Musculoskeletal: Negative for arthralgias, joint swelling and myalgias.  Skin: Negative for rash.  Neurological: Negative for dizziness, syncope, weakness, light-headedness and numbness.  Psychiatric/Behavioral: Negative for behavioral problems, confusion and sleep disturbance.    Immunization History  Administered Date(s) Administered   Influenza, Quadrivalent, Recombinant, Inj, Pf 06/18/2018, 05/04/2019, 06/15/2020   Influenza-Unspecified 09/15/2019, 07/08/2021   Moderna Sars-Covid-2 Vaccination 09/15/2019   PFIZER(Purple Top)SARS-COV-2 Vaccination 10/03/2019, 10/22/2019, 09/03/2020   Pneumococcal Conjugate-13 05/28/2014   Pneumococcal Polysaccharide-23  07/15/1998, 06/15/2005   Tdap 05/26/2016   Zoster, Live 05/18/2013, 05/28/2014   Pertinent  Health Maintenance Due  Topic Date Due   DEXA SCAN  04/16/2022 (Originally 03/06/1998)   INFLUENZA VACCINE  Completed   Fall Risk 03/30/2017 07/20/2017 11/03/2018 10/23/2019 05/14/2021  Falls in the past year? No No - 0 1  Was there an injury with Fall? - - - - 0  Fall Risk Category Calculator - - - - 1  Fall Risk Category - - - - Low  Patient Fall Risk Level - - Low fall risk - Moderate fall risk  Patient at Risk for Falls Due to - - - - History of fall(s);Impaired balance/gait  Fall risk Follow up - - - - Falls evaluation completed;Education provided;Falls prevention discussed   Functional Status Survey:    Vitals:   08/01/21 1035  BP: 127/72  Pulse: 80  Resp: 16  Temp: (!) 97 F (36.1 C)  SpO2: 96%  Weight: 154 lb 12.8 oz (70.2 kg)  Height: 5\' 5"  (1.651 m)   Body mass index is 25.76 kg/m. Physical Exam Constitutional:  Well-developed and well-nourished.  HENT:  Head: Normocephalic.  Mouth/Throat: Oropharynx is clear and moist.  Eyes: Pupils are equal, round, and reactive to light.  Neck: Neck supple.  Cardiovascular: Normal rate and normal heart sounds.  No murmur heard. Pulmonary/Chest: Effort normal and breath sounds normal. No respiratory distress. No wheezes. She has no rales.  Abdominal: Soft. Bowel sounds are normal. No distension. There is no tenderness. There is no rebound.  Musculoskeletal: No edema.  Lymphadenopathy: none Neurological: No Focal deficits Skin: Skin is warm and dry.  Psychiatric: Normal mood and affect. Behavior is normal. Thought content normal.   Labs reviewed: Recent Labs    08/27/20 0000 10/28/20 0000 04/17/21 0000  NA 139 138 140  K 4.5 4.5 4.2  CL 104 106 109*  CO2 23* 23* 24*  BUN 14 15 21   CREATININE 0.8 0.9 0.9  CALCIUM 9.8 9.1 8.9   Recent Labs    08/27/20 0000 10/28/20 0000 04/17/21 0000  AST 18 13 13   ALT 15 11 10   ALKPHOS  106 94 82  ALBUMIN 3.9 3.4* 5.3*   Recent Labs    08/27/20 0000 10/28/20 0000 04/17/21 0000  WBC 10.9 5.3 4.6  NEUTROABS 8.60  --  2,277.00  HGB 15.0 12.9 12.6  HCT 46 38 37  PLT 279 268 230   Lab Results  Component Value Date   TSH 2.88 10/28/2020   No results found for: HGBA1C Lab Results  Component Value Date   CHOL 156 08/27/2020   HDL 69 08/27/2020   LDLCALC 75 08/27/2020   TRIG 62 08/27/2020   CHOLHDL 2.8 02/15/2010    Significant Diagnostic Results in last 30 days:  No results found.  Assessment/Plan Vascular dementia without behavioral disturbance (HCC) Stable on Namenda Essential hypertension - labile No Meds Age-related osteoporosis without current pathological fracture Daughter has refused further treatment   Family/ staff Communication:   Labs/tests ordered:

## 2021-09-11 ENCOUNTER — Encounter: Payer: Self-pay | Admitting: Internal Medicine

## 2021-09-11 ENCOUNTER — Non-Acute Institutional Stay: Payer: Medicare Other | Admitting: Internal Medicine

## 2021-09-11 DIAGNOSIS — I10 Essential (primary) hypertension: Secondary | ICD-10-CM | POA: Diagnosis not present

## 2021-09-11 DIAGNOSIS — T148XXA Other injury of unspecified body region, initial encounter: Secondary | ICD-10-CM | POA: Diagnosis not present

## 2021-09-11 DIAGNOSIS — M810 Age-related osteoporosis without current pathological fracture: Secondary | ICD-10-CM | POA: Diagnosis not present

## 2021-09-11 DIAGNOSIS — F0150 Vascular dementia without behavioral disturbance: Secondary | ICD-10-CM | POA: Diagnosis not present

## 2021-09-11 NOTE — Progress Notes (Signed)
Location:   [place] Room Number: 6 Place of Service:  ALF [phone]) Provider:  Veleta Miners MD  Virgie Dad, MD  Patient Care Team: Virgie Dad, MD as PCP - General (Internal Medicine) Pa, [facility] (Ophthalmology) Dermatology, [facility] (Dermatology)  Extended Emergency Contact Information Primary Emergency Contact: Arelia Sneddon [place] of [place] Phone: [phone] Mobile Phone: [phone] Relation: Daughter  Code Status:  DNR Goals of care: Advanced Directive information Advanced Directives 09/11/2021  Does Patient Have a Medical Advance Directive? Yes  Type of Advance Directive Out of facility DNR (pink MOST or yellow form);[place];Living will  Does patient want to make changes to medical advance directive? No - Patient declined  Copy of [place] in Chart? No - copy requested  Would patient like information on creating a medical advance directive? -  Pre-existing out of facility DNR order (yellow form or pink MOST form) Yellow form placed in chart (order not valid for inpatient use)     Chief Complaint  Patient presents with   Acute Visit    Bruise     HPI:  Pt is a 87 y.o. female seen today for an acute visit for Right leg Bruise  Has h/o Dementia, HLD,Hypertension,  Diverticulitis and GERD  [place] lives in [place] Was seen by her sister to have big Bruise and swelling in her Right leg Nurses were not aware. She denies any fall. No Falls reported per Nurses. She is not on any Anticoagulant Patient walks with her walker Does have stable gait with the walker She is also c/o Pain in her Right thumb and Right Upper Extremity   Past Medical History:  Diagnosis Date   Anxiety    Cataract    Chest pain 02/16/2010   Myoview, negative for pharmacologic-stress induced ischemia, EF 74   Complication of anesthesia 1954   hard to wake up   Diverticulosis    DUB (dysfunctional  uterine bleeding)    Dysrhythmia    h/o PVCs- none recently- no meds   Fall    GERD (gastroesophageal reflux disease)    Hearing reduced    Hypertension    no meds currently   Loss of consciousness ([place])    Memory loss    Osteoarthritis of left hip    Osteoporosis    Palpitation 02/02/2013   A CardioNet MCOT (mobile cardiac outpatient telemetry ): NSR with PACs; sinus tachycardia with ventricular trigeminy. Brief atrial run. Facet cardiac; ventricular trigeminy and accelerated idioventricular rhythm. No significant tachyarrhythmias.   Palpitations    Postmenopausal bleeding 11/01/2014   Unsteady gait    Varicose veins of both legs with pain    Past Surgical History:  Procedure Laterality Date   APPENDECTOMY     CATARACT EXTRACTION     both eyes   CESAREAN SECTION  1962, 1963, [id number]   x 4   COLONOSCOPY  01/14/2017   DIAGNOSTIC LAPAROSCOPY     ECTOPIC PREGNANCY SURGERY  1966   FRACTURE SURGERY     HIP SURGERY Right    HYSTEROSCOPY WITH D & C N/A 11/02/2014   Procedure: DILATATION AND CURETTAGE /HYSTEROSCOPY;  Surgeon: Janyth Contes, MD;  Location: [facility];  Service: Gynecology;  Laterality: N/A;   Lower Extremity Venous Dopplers  01/2013   No thrombus or thrombophlebitis. R Greater Saphenous Vein - enlarged without other insufficiency (6 mm); right and left Small Saphenous Vein: Patent no valve insufficiency.   TONSILLECTOMY  TONSILLECTOMY AND ADENOIDECTOMY     TRANSTHORACIC ECHOCARDIOGRAM  01/2013   Normal LV size and function. EF 55-60%. Grade 1 diastolic dysfunction. Elevated LV filling pressures. No regional W. May.    Allergies  Allergen Reactions   Evista [Raloxifene]    Fosamax [Alendronate]    Penicillins    Polyoxyethylene Lauryl Ether [Sorbitan]    Moxifloxacin Rash    Allergies as of 09/11/2021       Reactions   Evista [raloxifene]    Fosamax [alendronate]    Penicillins    Polyoxyethylene Lauryl Ether [sorbitan]    Moxifloxacin Rash         Medication List        Accurate as of September 11, 2021  2:05 PM. If you have any questions, ask your nurse or doctor.          acetaminophen 500 MG tablet Commonly known as: TYLENOL Take 1,000 mg by mouth 3 (three) times daily.   CALCIUM 600 PO Take 1 tablet by mouth daily.   calcium carbonate 500 MG chewable tablet Commonly known as: TUMS - dosed in mg elemental calcium Chew 1 tablet by mouth 2 (two) times daily as needed for indigestion or heartburn.   diclofenac Sodium 1 % Gel Commonly known as: VOLTAREN Apply 1 g topically 3 (three) times daily.   fluticasone 50 MCG/ACT nasal spray Commonly known as: FLONASE Place 2 sprays into both nostrils daily.   loperamide 2 MG tablet Commonly known as: IMODIUM A-D Take 2 mg by mouth as needed for diarrhea or loose stools.   memantine 10 MG tablet Commonly known as: NAMENDA Take 10 mg by mouth 2 (two) times daily.   MULTIPLE VITAMIN PO Take by mouth daily.        Review of Systems  Constitutional:  Negative for activity change and appetite change.  HENT: Negative.    Respiratory:  Negative for cough and shortness of breath.   Cardiovascular:  Negative for leg swelling.  Gastrointestinal:  Negative for constipation.  Genitourinary: Negative.   Musculoskeletal:  Negative for arthralgias, gait problem and myalgias.  Skin:  Positive for color change.  Neurological:  Negative for dizziness and weakness.  Psychiatric/Behavioral:  Positive for confusion. Negative for dysphoric mood and sleep disturbance.    Immunization History  Administered Date(s) Administered   Influenza, Quadrivalent, Recombinant, Inj, Pf 06/18/2018, 05/04/2019, 06/15/2020   Influenza-Unspecified 09/15/2019, 07/08/2021   Moderna Sars-Covid-2 Vaccination 09/15/2019   PFIZER(Purple Top)SARS-COV-2 Vaccination 10/03/2019, 10/22/2019, 09/03/2020   Pneumococcal Conjugate-13 05/28/2014   Pneumococcal Polysaccharide-23 07/15/1998, 06/15/2005    Tdap 05/26/2016   Zoster, Live 05/18/2013, 05/28/2014   Pertinent  Health Maintenance Due  Topic Date Due   DEXA SCAN  04/16/2022 (Originally 03/06/1998)   INFLUENZA VACCINE  Completed   Fall Risk 03/30/2017 07/20/2017 11/03/2018 10/23/2019 05/14/2021  Falls in the past year? No No - 0 1  Was there an injury with Fall? - - - - 0  Fall Risk Category Calculator - - - - 1  Fall Risk Category - - - - Low  Patient Fall Risk Level - - Low fall risk - Moderate fall risk  Patient at Risk for Falls Due to - - - - History of fall(s);Impaired balance/gait  Fall risk Follow up - - - - Falls evaluation completed;Education provided;Falls prevention discussed   Functional Status Survey:    Vitals:   09/11/21 1338  BP: (!) 160/88  Pulse: 78  Resp: 20  Temp: 97.7 F (36.5 C)  SpO2:  96%  Weight: 151 lb 12.8 oz (68.9 kg)  Height: 5\' 5"  (1.651 m)   Body mass index is 25.26 kg/m. Physical Exam Vitals reviewed.  Constitutional:      Appearance: Normal appearance.  HENT:     Head: Normocephalic.     Nose: Nose normal.     Mouth/Throat:     Mouth: Mucous membranes are moist.     Pharynx: Oropharynx is clear.  Eyes:     Pupils: Pupils are equal, round, and reactive to light.  Cardiovascular:     Rate and Rhythm: Normal rate and regular rhythm.     Pulses: Normal pulses.     Heart sounds: Normal heart sounds. No murmur heard. Pulmonary:     Effort: Pulmonary effort is normal.     Breath sounds: Normal breath sounds.  Abdominal:     General: Abdomen is flat. Bowel sounds are normal.     Palpations: Abdomen is soft.  Musculoskeletal:        General: No swelling.     Cervical back: Neck supple.  Skin:    Comments: Right Leg has Bruise all starting from top of the Thigh area going down to the Ankle No Signs of infection though she is tender in some area Bruise looks atleast 75 days old  Thumb was not swollen Moving her Right Arm well Waling with her walker  Neurological:     General: No  focal deficit present.     Mental Status: She is alert.  Psychiatric:        Mood and Affect: Mood normal.        Thought Content: Thought content normal.    Labs reviewed: Recent Labs    10/28/20 0000 04/17/21 0000  NA 138 140  K 4.5 4.2  CL 106 109*  CO2 23* 24*  BUN 15 21  CREATININE 0.9 0.9  CALCIUM 9.1 8.9   Recent Labs    10/28/20 0000 04/17/21 0000  AST 13 13  ALT 11 10  ALKPHOS 94 82  ALBUMIN 3.4* 5.3*   Recent Labs    10/28/20 0000 04/17/21 0000  WBC 5.3 4.6  NEUTROABS  --  2,277.00  HGB 12.9 12.6  HCT 38 37  PLT 268 230   Lab Results  Component Value Date   TSH 2.88 10/28/2020   No results found for: HGBA1C Lab Results  Component Value Date   CHOL 156 08/27/2020   HDL 69 08/27/2020   LDLCALC 75 08/27/2020   TRIG 62 08/27/2020   CHOLHDL 2.8 02/15/2010    Significant Diagnostic Results in last 30 days:  No results found.  Assessment/Plan Bruise ? Etiology Patient and Nurses denies any report of fall But history looks like she fell on her Right side. Will have therapy evaluate her No Signs of infection Will continue to monitor  Vascular dementia without behavioral disturbance (HCC) Stable on Namenda Essential hypertension - labile No Meds Age-related osteoporosis without current pathological fracture Daughter has refused further treatment   Family/ staff Communication:   Labs/tests ordered:

## 2021-10-17 ENCOUNTER — Encounter: Payer: Self-pay | Admitting: Orthopedic Surgery

## 2021-10-17 ENCOUNTER — Non-Acute Institutional Stay: Payer: Medicare Other | Admitting: Orthopedic Surgery

## 2021-10-17 DIAGNOSIS — I10 Essential (primary) hypertension: Secondary | ICD-10-CM

## 2021-10-17 DIAGNOSIS — F0150 Vascular dementia without behavioral disturbance: Secondary | ICD-10-CM

## 2021-10-17 DIAGNOSIS — J3089 Other allergic rhinitis: Secondary | ICD-10-CM | POA: Diagnosis not present

## 2021-10-17 DIAGNOSIS — Z66 Do not resuscitate: Secondary | ICD-10-CM

## 2021-10-17 DIAGNOSIS — M810 Age-related osteoporosis without current pathological fracture: Secondary | ICD-10-CM

## 2021-10-17 DIAGNOSIS — M546 Pain in thoracic spine: Secondary | ICD-10-CM

## 2021-10-17 DIAGNOSIS — G8929 Other chronic pain: Secondary | ICD-10-CM | POA: Diagnosis not present

## 2021-10-17 DIAGNOSIS — T148XXA Other injury of unspecified body region, initial encounter: Secondary | ICD-10-CM | POA: Diagnosis not present

## 2021-10-17 NOTE — Progress Notes (Signed)
Location:  [place] Room Number: 06/A Place of Service:  ALF [phone]) Provider:  Yvonna Alanis, NP  Patient Care Team: Virgie Dad, MD as PCP - General (Internal Medicine) Pa, [facility] (Ophthalmology) Dermatology, [facility] (Dermatology)  Extended Emergency Contact Information Primary Emergency Contact: Arelia Sneddon [place] of [place] Phone: [phone] Mobile Phone: [phone] Relation: Daughter  Code Status:  DNR Goals of care: Advanced Directive information Advanced Directives 10/17/2021  Does Patient Have a Medical Advance Directive? Yes  Type of Advance Directive Out of facility DNR (pink MOST or yellow form);[place];Living will  Does patient want to make changes to medical advance directive? No - Patient declined  Copy of [place] in Chart? No - copy requested  Would patient like information on creating a medical advance directive? -  Pre-existing out of facility DNR order (yellow form or pink MOST form) Yellow form placed in chart (order not valid for inpatient use)     Chief Complaint  Patient presents with   Medical Management of Chronic Issues    Routine visit. Discuss Shingrix vaccine    HPI:  Pt is a 88 y.o. female seen today for medical management of chronic diseases.    She continues to reside in assisted living at [facility]. PMH: HTN, venous insufficiency, GERD, dementia, HLD, and diverticulitis.   Vascular dementia- MMSE 22/30 10/2020, MRI brain 2022 indicated mild age related cerebral atrophy with chronic small vessel disease, no recent behavioral outbursts, remains on Namenda  HTN- BUN/creat 21/0.9 04/17/2021, not on medications, see below Leg bruising- bruising from right thigh to ankle noted 09/11/2021, subsided today, left shin with minor bruising today- denies falls, does not know how she got it Chronic back pain- denies pain today, remains on scheduled tylenol   Osteoporosis- no additional workup per goals of care, remains on calcium supplement Allergies- remains on Claritin and Flonase  No recent falls. Ambulates with walker.   Recent blood pressures:  02/01- 151/89  01/25- 125/90  01/18- 119/82  Recent weights:  02/01- 154.2 lbs  01/02- 153.2 lbs  12/04- 151.8 lbs     Past Medical History:  Diagnosis Date   Anxiety    Cataract    Chest pain 02/16/2010   Myoview, negative for pharmacologic-stress induced ischemia, EF 74   Complication of anesthesia 1954   hard to wake up   Diverticulosis    DUB (dysfunctional uterine bleeding)    Dysrhythmia    h/o PVCs- none recently- no meds   Fall    GERD (gastroesophageal reflux disease)    Hearing reduced    Hypertension    no meds currently   Loss of consciousness ([place])    Memory loss    Osteoarthritis of left hip    Osteoporosis    Palpitation 02/02/2013   A CardioNet MCOT (mobile cardiac outpatient telemetry ): NSR with PACs; sinus tachycardia with ventricular trigeminy. Brief atrial run. Facet cardiac; ventricular trigeminy and accelerated idioventricular rhythm. No significant tachyarrhythmias.   Palpitations    Postmenopausal bleeding 11/01/2014   Unsteady gait    Varicose veins of both legs with pain    Past Surgical History:  Procedure Laterality Date   APPENDECTOMY     CATARACT EXTRACTION     both eyes   CESAREAN SECTION  1962, 1963, [id number]   x 4   COLONOSCOPY  01/14/2017   DIAGNOSTIC LAPAROSCOPY     ECTOPIC PREGNANCY SURGERY  1966  FRACTURE SURGERY     HIP SURGERY Right    HYSTEROSCOPY WITH D & C N/A 11/02/2014   Procedure: DILATATION AND CURETTAGE /HYSTEROSCOPY;  Surgeon: Janyth Contes, MD;  Location: [facility];  Service: Gynecology;  Laterality: N/A;   Lower Extremity Venous Dopplers  01/2013   No thrombus or thrombophlebitis. R Greater Saphenous Vein - enlarged without other insufficiency (6 mm); right and left Small Saphenous Vein: Patent no valve  insufficiency.   TONSILLECTOMY     TONSILLECTOMY AND ADENOIDECTOMY     TRANSTHORACIC ECHOCARDIOGRAM  01/2013   Normal LV size and function. EF 55-60%. Grade 1 diastolic dysfunction. Elevated LV filling pressures. No regional W. May.    Allergies  Allergen Reactions   Evista [Raloxifene]    Fosamax [Alendronate]    Penicillins    Polyoxyethylene Lauryl Ether [Sorbitan]    Moxifloxacin Rash    Outpatient Encounter Medications as of 10/17/2021  Medication Sig   acetaminophen (TYLENOL) 500 MG tablet Take 1,000 mg by mouth 3 (three) times daily.   Calcium Carbonate (CALCIUM 600 PO) Take 1 tablet by mouth daily.   calcium carbonate (TUMS - DOSED IN MG ELEMENTAL CALCIUM) 500 MG chewable tablet Chew 1 tablet by mouth 2 (two) times daily as needed for indigestion or heartburn.   diclofenac Sodium (VOLTAREN) 1 % GEL Apply 1 g topically 3 (three) times daily.   fluticasone (FLONASE) 50 MCG/ACT nasal spray Place 2 sprays into both nostrils daily.   loperamide (IMODIUM A-D) 2 MG tablet Take 2 mg by mouth as needed for diarrhea or loose stools.   loratadine (CLARITIN) 10 MG tablet Take 10 mg by mouth in the morning.   memantine (NAMENDA) 10 MG tablet Take 10 mg by mouth 2 (two) times daily.   MULTIPLE VITAMIN PO Take by mouth daily.   No facility-administered encounter medications on file as of 10/17/2021.    Review of Systems  Constitutional:  Negative for activity change, appetite change, chills, fatigue and fever.  HENT:  Positive for rhinorrhea, sinus pressure and sneezing. Negative for congestion, sinus pain and trouble swallowing.   Eyes:  Negative for visual disturbance.  Respiratory:  Negative for cough, shortness of breath and wheezing.   Cardiovascular:  Negative for chest pain and leg swelling.  Gastrointestinal:  Negative for abdominal distention, abdominal pain, constipation, diarrhea and nausea.  Genitourinary:  Negative for dysuria, frequency and hematuria.  Musculoskeletal:   Positive for back pain and gait problem.  Skin:  Positive for wound.  Neurological:  Negative for dizziness, weakness and headaches.  Hematological:  Bruises/bleeds easily.  Psychiatric/Behavioral:  Positive for confusion. Negative for dysphoric mood and sleep disturbance. The patient is not nervous/anxious.    Immunization History  Administered Date(s) Administered   Influenza, Quadrivalent, Recombinant, Inj, Pf 06/18/2018, 05/04/2019, 06/15/2020   Influenza-Unspecified 09/15/2019, 07/08/2021   Moderna Sars-Covid-2 Vaccination 09/15/2019   PFIZER(Purple Top)SARS-COV-2 Vaccination 10/03/2019, 10/22/2019, 09/03/2020   Pfizer Covid-19 Vaccine Bivalent Booster 9yrs & up 06/04/2021   Pneumococcal Conjugate-13 05/28/2014   Pneumococcal Polysaccharide-23 07/15/1998, 06/15/2005   Tdap 05/26/2016   Zoster, Live 05/18/2013, 05/28/2014   Pertinent  Health Maintenance Due  Topic Date Due   DEXA SCAN  04/16/2022 (Originally 03/06/1998)   INFLUENZA VACCINE  Completed   Fall Risk 03/30/2017 07/20/2017 11/03/2018 10/23/2019 05/14/2021  Falls in the past year? No No - 0 1  Was there an injury with Fall? - - - - 0  Fall Risk Category Calculator - - - - 1  Fall Risk Category - - - -  Low  Patient Fall Risk Level - - Low fall risk - Moderate fall risk  Patient at Risk for Falls Due to - - - - History of fall(s);Impaired balance/gait  Fall risk Follow up - - - - Falls evaluation completed;Education provided;Falls prevention discussed   Functional Status Survey:    Vitals:   10/17/21 1333  BP: (!) 151/89  Pulse: 65  Resp: 16  Temp: 97.7 F (36.5 C)  SpO2: 95%  Weight: 154 lb 3.2 oz (69.9 kg)  Height: 5\' 5"  (1.651 m)   Body mass index is 25.66 kg/m. Physical Exam Vitals reviewed.  Constitutional:      General: She is not in acute distress. HENT:     Head: Normocephalic.     Right Ear: There is no impacted cerumen.     Left Ear: There is no impacted cerumen.     Nose: Nose normal.      Mouth/Throat:     Mouth: Mucous membranes are moist.  Eyes:     General:        Right eye: No discharge.        Left eye: No discharge.  Neck:     Vascular: No carotid bruit.  Cardiovascular:     Rate and Rhythm: Normal rate and regular rhythm.     Pulses: Normal pulses.     Heart sounds: Normal heart sounds. No murmur heard. Pulmonary:     Effort: Pulmonary effort is normal. No respiratory distress.     Breath sounds: Normal breath sounds. No wheezing.  Abdominal:     General: Bowel sounds are normal. There is no distension.     Palpations: Abdomen is soft.     Tenderness: There is no abdominal tenderness.  Musculoskeletal:     Cervical back: Neck supple.     Right lower leg: No edema.     Left lower leg: No edema.  Lymphadenopathy:     Cervical: No cervical adenopathy.  Skin:    General: Skin is warm and dry.     Capillary Refill: Capillary refill takes less than 2 seconds.     Comments: Right leg bruising healed. Left shin with bruising, linear, no skin breakdown  Neurological:     General: No focal deficit present.     Mental Status: She is alert. Mental status is at baseline.     Motor: Weakness present.     Gait: Gait abnormal.     Comments: walker  Psychiatric:        Mood and Affect: Mood normal.        Behavior: Behavior normal.        Cognition and Memory: Memory is impaired.    Labs reviewed: Recent Labs    10/28/20 0000 04/17/21 0000  NA 138 140  K 4.5 4.2  CL 106 109*  CO2 23* 24*  BUN 15 21  CREATININE 0.9 0.9  CALCIUM 9.1 8.9   Recent Labs    10/28/20 0000 04/17/21 0000  AST 13 13  ALT 11 10  ALKPHOS 94 82  ALBUMIN 3.4* 5.3*   Recent Labs    10/28/20 0000 04/17/21 0000  WBC 5.3 4.6  NEUTROABS  --  2,277.00  HGB 12.9 12.6  HCT 38 37  PLT 268 230   Lab Results  Component Value Date   TSH 2.88 10/28/2020   No results found for: HGBA1C Lab Results  Component Value Date   CHOL 156 08/27/2020   HDL 69 08/27/2020   [place]  75  08/27/2020   TRIG 62 08/27/2020   CHOLHDL 2.8 02/15/2010    Significant Diagnostic Results in last 30 days:  No results found.  Assessment/Plan: 1. Do not resuscitate  2. Vascular dementia without behavioral disturbance (HCC) - no behavioral outbursts - ambulates well, performs ADLs on own - social with others - cont Namenda  3. Essential hypertension - labile - controlled without medication - cbc/diff - cmp  4. Bruise - right leg resolved - bruising to left shin today - cannot recall injury   5. Chronic midline thoracic back pain - stable with scheduled tylenol  6. Age-related osteoporosis without current pathological fracture - no further workup per goals of care - cont calcium  7. Non-seasonal allergic rhinitis due to other allergic trigger - stable with Claritin and Flonase    Family/ staff Communication: plan discussed with patient and nurse  Labs/tests ordered:  cbc/diff, cmp, TSH

## 2021-10-20 DIAGNOSIS — D649 Anemia, unspecified: Secondary | ICD-10-CM | POA: Diagnosis not present

## 2021-10-20 DIAGNOSIS — E039 Hypothyroidism, unspecified: Secondary | ICD-10-CM | POA: Diagnosis not present

## 2021-10-21 LAB — CBC AND DIFFERENTIAL
HCT: 38 (ref 36–46)
Hemoglobin: 12.7 (ref 12.0–16.0)
Neutrophils Absolute: 2472
Platelets: 251 10*3/uL (ref 150–400)
WBC: 4.7

## 2021-10-21 LAB — BASIC METABOLIC PANEL
BUN: 20 (ref 4–21)
CO2: 25 — AB (ref 13–22)
Chloride: 107 (ref 99–108)
Creatinine: 0.9 (ref 0.5–1.1)
Glucose: 61
Potassium: 4.5 mEq/L (ref 3.5–5.1)
Sodium: 140 (ref 137–147)

## 2021-10-21 LAB — COMPREHENSIVE METABOLIC PANEL
Albumin: 3.6 (ref 3.5–5.0)
Calcium: 9.1 (ref 8.7–10.7)
Globulin: 2.4

## 2021-10-21 LAB — HEPATIC FUNCTION PANEL
ALT: 12 U/L (ref 7–35)
AST: 18 (ref 13–35)
Alkaline Phosphatase: 96 (ref 25–125)
Bilirubin, Total: 0.5

## 2021-10-21 LAB — CBC: RBC: 4.18 (ref 3.87–5.11)

## 2021-10-21 LAB — TSH: TSH: 4.09 (ref 0.41–5.90)

## 2022-01-14 DIAGNOSIS — Z20822 Contact with and (suspected) exposure to covid-19: Secondary | ICD-10-CM | POA: Diagnosis not present

## 2022-01-22 ENCOUNTER — Encounter: Payer: Self-pay | Admitting: Internal Medicine

## 2022-01-22 ENCOUNTER — Non-Acute Institutional Stay: Payer: Medicare Other | Admitting: Internal Medicine

## 2022-01-22 DIAGNOSIS — I10 Essential (primary) hypertension: Secondary | ICD-10-CM | POA: Diagnosis not present

## 2022-01-22 DIAGNOSIS — M810 Age-related osteoporosis without current pathological fracture: Secondary | ICD-10-CM | POA: Diagnosis not present

## 2022-01-22 DIAGNOSIS — F0150 Vascular dementia without behavioral disturbance: Secondary | ICD-10-CM | POA: Diagnosis not present

## 2022-01-22 NOTE — Progress Notes (Signed)
?Location:   [place] Room Number: 06 ?Place of Service:  ALF (13) ?Provider:  Veleta Miners MD ? ?Melissa Dad, MD ? ?Patient Care Team: ?Melissa Dad, MD as PCP - General (Internal Medicine) ?Pa, [facility] (Ophthalmology) ?Dermatology, [facility] (Dermatology) ? ?Extended Emergency Contact Information ?Primary Emergency Contact: Jones,Beth ? [place] ?Home Phone: [phone] ?Mobile Phone: [phone] ?Relation: Daughter ? ?Code Status:  DNR ?Goals of care: Advanced Directive information ? ?  01/22/2022  ? 11:01 AM  ?Advanced Directives  ?Does Patient Have a Medical Advance Directive? Yes  ?Type of Advance Directive Out of facility DNR (pink MOST or yellow form);Living will  ?Does patient want to make changes to medical advance directive? No - Patient declined  ?Pre-existing out of facility DNR order (yellow form or pink MOST form) Yellow form placed in chart (order not valid for inpatient use)  ? ? ? ?Chief Complaint  ?Patient presents with  ? Medical Management of Chronic Issues  ? Quality Metric Gaps  ?  Verified [facility] and [facility] patient is due for shingrix  ? ? ?HPI:  ?Pt is a 88 y.o. female seen today for medical management of chronic diseases.   ? ?Lives in [place] ? ?Has h/o Dementia, HLD,Hypertension,  Diverticulitis and GERD ?  ?  ? ?She is stable. No new Nursing issues. No Behavior issues ?Her weight is stable ?Walks with her walker ?No Falls ?Wt Readings from Last 3 Encounters:  ?01/22/22 153 lb 6.4 oz (69.6 kg)  ?10/17/21 154 lb 3.2 oz (69.9 kg)  ?09/11/21 151 lb 12.8 oz (68.9 kg)  ? ?Past Medical History:  ?Diagnosis Date  ? Anxiety   ? Cataract   ? Chest pain 02/16/2010  ? Myoview, negative for pharmacologic-stress induced ischemia, EF 74  ? Complication of anesthesia 1954  ? hard to wake up  ? Diverticulosis   ? DUB (dysfunctional uterine bleeding)   ? Dysrhythmia   ? h/o PVCs- none recently- no meds  ? Fall   ? GERD (gastroesophageal reflux disease)    ? Hearing reduced   ? Hypertension   ? no meds currently  ? Loss of consciousness ([place])   ? Memory loss   ? Osteoarthritis of left hip   ? Osteoporosis   ? Palpitation 02/02/2013  ? A CardioNet MCOT (mobile cardiac outpatient telemetry ): NSR with PACs; sinus tachycardia with ventricular trigeminy. Brief atrial run. Facet cardiac; ventricular trigeminy and accelerated idioventricular rhythm. No significant tachyarrhythmias.  ? Palpitations   ? Postmenopausal bleeding 11/01/2014  ? Unsteady gait   ? Varicose veins of both legs with pain   ? ?Past Surgical History:  ?Procedure Laterality Date  ? APPENDECTOMY    ? CATARACT EXTRACTION    ? both eyes  ? [place], [id number]  ? x 4  ? COLONOSCOPY  01/14/2017  ? DIAGNOSTIC LAPAROSCOPY    ? [place]  ? FRACTURE SURGERY    ? HIP SURGERY Right   ? HYSTEROSCOPY WITH D & C N/A 11/02/2014  ? Procedure: DILATATION AND CURETTAGE /HYSTEROSCOPY;  Surgeon: Janyth Contes, MD;  Location: [facility];  Service: Gynecology;  Laterality: N/A;  ? Lower Extremity Venous Dopplers  01/2013  ? No thrombus or thrombophlebitis. R Greater Saphenous Vein - enlarged without other insufficiency (6 mm); right and left Small Saphenous Vein: Patent no valve insufficiency.  ? TONSILLECTOMY    ? TONSILLECTOMY AND ADENOIDECTOMY    ?  TRANSTHORACIC ECHOCARDIOGRAM  01/2013  ? Normal LV size and function. EF 55-60%. Grade 1 diastolic dysfunction. Elevated LV filling pressures. No regional W. May.  ? ? ?Allergies  ?Allergen Reactions  ? Evista [Raloxifene]   ? Fosamax [Alendronate]   ? Penicillins   ? Polyoxyethylene Lauryl Ether [Sorbitan]   ? Moxifloxacin Rash  ? ? ?Allergies as of 01/22/2022   ? ?   Reactions  ? Evista [raloxifene]   ? Fosamax [alendronate]   ? Penicillins   ? Polyoxyethylene Lauryl Ether [sorbitan]   ? Moxifloxacin Rash  ? ?  ? ?  ?Medication List  ?  ? ?  ? Accurate as of Jan 22, 2022 11:02 AM. If you have any questions, ask your nurse or  doctor.  ?  ?  ? ?  ? ?acetaminophen 500 MG tablet ?Commonly known as: TYLENOL ?Take 1,000 mg by mouth 3 (three) times daily. ?  ?CALCIUM 600 PO ?Take 1 tablet by mouth daily. ?  ?calcium carbonate 500 MG chewable tablet ?Commonly known as: TUMS - dosed in mg elemental calcium ?Chew 1 tablet by mouth 2 (two) times daily as needed for indigestion or heartburn. ?  ?CeraVe Lotn ?Apply 1 application. topically daily. ?  ?diclofenac Sodium 1 % Gel ?Commonly known as: VOLTAREN ?Apply 1 g topically 3 (three) times daily. ?  ?fluticasone 50 MCG/ACT nasal spray ?Commonly known as: FLONASE ?Place 2 sprays into both nostrils daily. ?  ?loperamide 2 MG tablet ?Commonly known as: IMODIUM A-D ?Take 2 mg by mouth as needed for diarrhea or loose stools. ?  ?loratadine 10 MG tablet ?Commonly known as: CLARITIN ?Take 10 mg by mouth in the morning. ?  ?memantine 10 MG tablet ?Commonly known as: NAMENDA ?Take 10 mg by mouth 2 (two) times daily. ?  ?MULTIPLE VITAMIN PO ?Take by mouth daily. ?  ?Robafen DM 10-100 MG/5ML liquid ?Generic drug: Dextromethorphan-guaiFENesin ?Take 5 mLs by mouth every 6 (six) hours as needed. ?  ? ?  ? ? ?Review of Systems  ?Constitutional:  Negative for activity change and appetite change.  ?HENT: Negative.    ?Respiratory:  Negative for cough and shortness of breath.   ?Cardiovascular:  Negative for leg swelling.  ?Gastrointestinal:  Negative for constipation.  ?Genitourinary: Negative.   ?Musculoskeletal:  Negative for arthralgias, gait problem and myalgias.  ?Skin: Negative.   ?Neurological:  Negative for dizziness and weakness.  ?Psychiatric/Behavioral:  Negative for confusion, dysphoric mood and sleep disturbance.   ? ?Immunization History  ?Administered Date(s) Administered  ? Influenza, Quadrivalent, Recombinant, Inj, Pf 06/18/2018, 05/04/2019, 06/15/2020  ? Influenza-Unspecified 09/15/2019, 07/08/2021  ? Moderna Sars-Covid-2 Vaccination 09/15/2019  ? PFIZER(Purple Top)SARS-COV-2 Vaccination  10/03/2019, 10/22/2019, 09/03/2020  ? Pension scheme manager 42yr & up 06/04/2021  ? Pneumococcal Conjugate-13 05/28/2014  ? Pneumococcal Polysaccharide-23 07/15/1998, 06/15/2005  ? Tdap 05/26/2016  ? Zoster, Live 05/18/2013, 05/28/2014  ? ?Pertinent  Health Maintenance Due  ?Topic Date Due  ? DEXA SCAN  04/16/2022 (Originally 03/06/1998)  ? INFLUENZA VACCINE  04/14/2022  ? ? ?  03/30/2017  ? 11:37 AM 07/20/2017  ? 11:09 AM 11/03/2018  ?  1:24 PM 10/23/2019  ?  1:44 PM 05/14/2021  ?  1:40 PM  ?Fall Risk  ?Falls in the past year? No No  0 1  ?Was there an injury with Fall?     0  ?Fall Risk Category Calculator     1  ?Fall Risk Category     Low  ?Patient Fall  Risk Level   Low fall risk  Moderate fall risk  ?Patient at Risk for Falls Due to     History of fall(s);Impaired balance/gait  ?Fall risk Follow up     Falls evaluation completed;Education provided;Falls prevention discussed  ? ?Functional Status Survey: ?  ? ?Vitals:  ? 01/22/22 1041  ?BP: 127/71  ?Pulse: 72  ?Temp: (!) 97 ?F (36.1 ?C)  ?SpO2: 98%  ?Weight: 153 lb 6.4 oz (69.6 kg)  ?Height: '5\' 5"'$  (1.651 m)  ? ?Body mass index is 25.53 kg/m?Marland Kitchen ?Physical Exam ?Vitals reviewed.  ?Constitutional:   ?   Appearance: Normal appearance.  ?HENT:  ?   Head: Normocephalic.  ?   Nose: Nose normal.  ?   Mouth/Throat:  ?   Mouth: Mucous membranes are moist.  ?   Pharynx: Oropharynx is clear.  ?Eyes:  ?   Pupils: Pupils are equal, round, and reactive to light.  ?Cardiovascular:  ?   Rate and Rhythm: Normal rate and regular rhythm.  ?   Pulses: Normal pulses.  ?   Heart sounds: Normal heart sounds. No murmur heard. ?Pulmonary:  ?   Effort: Pulmonary effort is normal.  ?   Breath sounds: Normal breath sounds.  ?Abdominal:  ?   General: Abdomen is flat. Bowel sounds are normal.  ?   Palpations: Abdomen is soft.  ?Musculoskeletal:     ?   General: No swelling.  ?   Cervical back: Neck supple.  ?Skin: ?   General: Skin is warm.  ?Neurological:  ?   General: No focal  deficit present.  ?   Mental Status: She is alert and oriented to person, place, and time.  ?Psychiatric:     ?   Mood and Affect: Mood normal.     ?   Thought Content: Thought content normal.  ? ? ?Labs reviewed: ?R

## 2022-01-23 DIAGNOSIS — R4789 Other speech disturbances: Secondary | ICD-10-CM | POA: Diagnosis not present

## 2022-01-23 DIAGNOSIS — M25559 Pain in unspecified hip: Secondary | ICD-10-CM | POA: Diagnosis not present

## 2022-01-23 DIAGNOSIS — R2689 Other abnormalities of gait and mobility: Secondary | ICD-10-CM | POA: Diagnosis not present

## 2022-01-23 DIAGNOSIS — R278 Other lack of coordination: Secondary | ICD-10-CM | POA: Diagnosis not present

## 2022-01-23 DIAGNOSIS — R2681 Unsteadiness on feet: Secondary | ICD-10-CM | POA: Diagnosis not present

## 2022-01-23 DIAGNOSIS — G309 Alzheimer's disease, unspecified: Secondary | ICD-10-CM | POA: Diagnosis not present

## 2022-01-26 DIAGNOSIS — R4789 Other speech disturbances: Secondary | ICD-10-CM | POA: Diagnosis not present

## 2022-01-26 DIAGNOSIS — R278 Other lack of coordination: Secondary | ICD-10-CM | POA: Diagnosis not present

## 2022-01-26 DIAGNOSIS — G309 Alzheimer's disease, unspecified: Secondary | ICD-10-CM | POA: Diagnosis not present

## 2022-01-26 DIAGNOSIS — M25559 Pain in unspecified hip: Secondary | ICD-10-CM | POA: Diagnosis not present

## 2022-01-26 DIAGNOSIS — R2681 Unsteadiness on feet: Secondary | ICD-10-CM | POA: Diagnosis not present

## 2022-01-26 DIAGNOSIS — R2689 Other abnormalities of gait and mobility: Secondary | ICD-10-CM | POA: Diagnosis not present

## 2022-01-27 DIAGNOSIS — R2681 Unsteadiness on feet: Secondary | ICD-10-CM | POA: Diagnosis not present

## 2022-01-27 DIAGNOSIS — G309 Alzheimer's disease, unspecified: Secondary | ICD-10-CM | POA: Diagnosis not present

## 2022-01-27 DIAGNOSIS — R278 Other lack of coordination: Secondary | ICD-10-CM | POA: Diagnosis not present

## 2022-01-27 DIAGNOSIS — R2689 Other abnormalities of gait and mobility: Secondary | ICD-10-CM | POA: Diagnosis not present

## 2022-01-27 DIAGNOSIS — R4789 Other speech disturbances: Secondary | ICD-10-CM | POA: Diagnosis not present

## 2022-01-27 DIAGNOSIS — M25559 Pain in unspecified hip: Secondary | ICD-10-CM | POA: Diagnosis not present

## 2022-01-28 DIAGNOSIS — G309 Alzheimer's disease, unspecified: Secondary | ICD-10-CM | POA: Diagnosis not present

## 2022-01-28 DIAGNOSIS — M25559 Pain in unspecified hip: Secondary | ICD-10-CM | POA: Diagnosis not present

## 2022-01-28 DIAGNOSIS — R2689 Other abnormalities of gait and mobility: Secondary | ICD-10-CM | POA: Diagnosis not present

## 2022-01-28 DIAGNOSIS — R2681 Unsteadiness on feet: Secondary | ICD-10-CM | POA: Diagnosis not present

## 2022-01-28 DIAGNOSIS — R278 Other lack of coordination: Secondary | ICD-10-CM | POA: Diagnosis not present

## 2022-01-28 DIAGNOSIS — R4789 Other speech disturbances: Secondary | ICD-10-CM | POA: Diagnosis not present

## 2022-01-29 DIAGNOSIS — G309 Alzheimer's disease, unspecified: Secondary | ICD-10-CM | POA: Diagnosis not present

## 2022-01-29 DIAGNOSIS — R2681 Unsteadiness on feet: Secondary | ICD-10-CM | POA: Diagnosis not present

## 2022-01-29 DIAGNOSIS — M25559 Pain in unspecified hip: Secondary | ICD-10-CM | POA: Diagnosis not present

## 2022-01-29 DIAGNOSIS — R4789 Other speech disturbances: Secondary | ICD-10-CM | POA: Diagnosis not present

## 2022-01-29 DIAGNOSIS — R2689 Other abnormalities of gait and mobility: Secondary | ICD-10-CM | POA: Diagnosis not present

## 2022-01-29 DIAGNOSIS — R278 Other lack of coordination: Secondary | ICD-10-CM | POA: Diagnosis not present

## 2022-01-30 DIAGNOSIS — R2689 Other abnormalities of gait and mobility: Secondary | ICD-10-CM | POA: Diagnosis not present

## 2022-01-30 DIAGNOSIS — M25559 Pain in unspecified hip: Secondary | ICD-10-CM | POA: Diagnosis not present

## 2022-01-30 DIAGNOSIS — R4789 Other speech disturbances: Secondary | ICD-10-CM | POA: Diagnosis not present

## 2022-01-30 DIAGNOSIS — R2681 Unsteadiness on feet: Secondary | ICD-10-CM | POA: Diagnosis not present

## 2022-01-30 DIAGNOSIS — R278 Other lack of coordination: Secondary | ICD-10-CM | POA: Diagnosis not present

## 2022-01-30 DIAGNOSIS — G309 Alzheimer's disease, unspecified: Secondary | ICD-10-CM | POA: Diagnosis not present

## 2022-02-03 DIAGNOSIS — G309 Alzheimer's disease, unspecified: Secondary | ICD-10-CM | POA: Diagnosis not present

## 2022-02-03 DIAGNOSIS — R278 Other lack of coordination: Secondary | ICD-10-CM | POA: Diagnosis not present

## 2022-02-03 DIAGNOSIS — R4789 Other speech disturbances: Secondary | ICD-10-CM | POA: Diagnosis not present

## 2022-02-03 DIAGNOSIS — R2681 Unsteadiness on feet: Secondary | ICD-10-CM | POA: Diagnosis not present

## 2022-02-03 DIAGNOSIS — M25559 Pain in unspecified hip: Secondary | ICD-10-CM | POA: Diagnosis not present

## 2022-02-03 DIAGNOSIS — R2689 Other abnormalities of gait and mobility: Secondary | ICD-10-CM | POA: Diagnosis not present

## 2022-02-04 DIAGNOSIS — R4789 Other speech disturbances: Secondary | ICD-10-CM | POA: Diagnosis not present

## 2022-02-04 DIAGNOSIS — R2681 Unsteadiness on feet: Secondary | ICD-10-CM | POA: Diagnosis not present

## 2022-02-04 DIAGNOSIS — R278 Other lack of coordination: Secondary | ICD-10-CM | POA: Diagnosis not present

## 2022-02-04 DIAGNOSIS — G309 Alzheimer's disease, unspecified: Secondary | ICD-10-CM | POA: Diagnosis not present

## 2022-02-04 DIAGNOSIS — R2689 Other abnormalities of gait and mobility: Secondary | ICD-10-CM | POA: Diagnosis not present

## 2022-02-04 DIAGNOSIS — M25559 Pain in unspecified hip: Secondary | ICD-10-CM | POA: Diagnosis not present

## 2022-02-05 DIAGNOSIS — R278 Other lack of coordination: Secondary | ICD-10-CM | POA: Diagnosis not present

## 2022-02-05 DIAGNOSIS — R2681 Unsteadiness on feet: Secondary | ICD-10-CM | POA: Diagnosis not present

## 2022-02-05 DIAGNOSIS — M25559 Pain in unspecified hip: Secondary | ICD-10-CM | POA: Diagnosis not present

## 2022-02-05 DIAGNOSIS — G309 Alzheimer's disease, unspecified: Secondary | ICD-10-CM | POA: Diagnosis not present

## 2022-02-05 DIAGNOSIS — R4789 Other speech disturbances: Secondary | ICD-10-CM | POA: Diagnosis not present

## 2022-02-05 DIAGNOSIS — R2689 Other abnormalities of gait and mobility: Secondary | ICD-10-CM | POA: Diagnosis not present

## 2022-02-06 DIAGNOSIS — G309 Alzheimer's disease, unspecified: Secondary | ICD-10-CM | POA: Diagnosis not present

## 2022-02-06 DIAGNOSIS — M25559 Pain in unspecified hip: Secondary | ICD-10-CM | POA: Diagnosis not present

## 2022-02-06 DIAGNOSIS — R2689 Other abnormalities of gait and mobility: Secondary | ICD-10-CM | POA: Diagnosis not present

## 2022-02-06 DIAGNOSIS — R278 Other lack of coordination: Secondary | ICD-10-CM | POA: Diagnosis not present

## 2022-02-06 DIAGNOSIS — R2681 Unsteadiness on feet: Secondary | ICD-10-CM | POA: Diagnosis not present

## 2022-02-06 DIAGNOSIS — R4789 Other speech disturbances: Secondary | ICD-10-CM | POA: Diagnosis not present

## 2022-02-10 DIAGNOSIS — M25559 Pain in unspecified hip: Secondary | ICD-10-CM | POA: Diagnosis not present

## 2022-02-10 DIAGNOSIS — R2689 Other abnormalities of gait and mobility: Secondary | ICD-10-CM | POA: Diagnosis not present

## 2022-02-10 DIAGNOSIS — G309 Alzheimer's disease, unspecified: Secondary | ICD-10-CM | POA: Diagnosis not present

## 2022-02-10 DIAGNOSIS — R278 Other lack of coordination: Secondary | ICD-10-CM | POA: Diagnosis not present

## 2022-02-10 DIAGNOSIS — R2681 Unsteadiness on feet: Secondary | ICD-10-CM | POA: Diagnosis not present

## 2022-02-10 DIAGNOSIS — R4789 Other speech disturbances: Secondary | ICD-10-CM | POA: Diagnosis not present

## 2022-02-11 DIAGNOSIS — R278 Other lack of coordination: Secondary | ICD-10-CM | POA: Diagnosis not present

## 2022-02-11 DIAGNOSIS — R4789 Other speech disturbances: Secondary | ICD-10-CM | POA: Diagnosis not present

## 2022-02-11 DIAGNOSIS — R2689 Other abnormalities of gait and mobility: Secondary | ICD-10-CM | POA: Diagnosis not present

## 2022-02-11 DIAGNOSIS — G309 Alzheimer's disease, unspecified: Secondary | ICD-10-CM | POA: Diagnosis not present

## 2022-02-11 DIAGNOSIS — R2681 Unsteadiness on feet: Secondary | ICD-10-CM | POA: Diagnosis not present

## 2022-02-11 DIAGNOSIS — M25559 Pain in unspecified hip: Secondary | ICD-10-CM | POA: Diagnosis not present

## 2022-02-12 DIAGNOSIS — R2681 Unsteadiness on feet: Secondary | ICD-10-CM | POA: Diagnosis not present

## 2022-02-12 DIAGNOSIS — R278 Other lack of coordination: Secondary | ICD-10-CM | POA: Diagnosis not present

## 2022-02-12 DIAGNOSIS — R2689 Other abnormalities of gait and mobility: Secondary | ICD-10-CM | POA: Diagnosis not present

## 2022-02-12 DIAGNOSIS — M25559 Pain in unspecified hip: Secondary | ICD-10-CM | POA: Diagnosis not present

## 2022-02-12 DIAGNOSIS — G309 Alzheimer's disease, unspecified: Secondary | ICD-10-CM | POA: Diagnosis not present

## 2022-02-12 DIAGNOSIS — R4789 Other speech disturbances: Secondary | ICD-10-CM | POA: Diagnosis not present

## 2022-02-13 DIAGNOSIS — R2689 Other abnormalities of gait and mobility: Secondary | ICD-10-CM | POA: Diagnosis not present

## 2022-02-13 DIAGNOSIS — R278 Other lack of coordination: Secondary | ICD-10-CM | POA: Diagnosis not present

## 2022-02-13 DIAGNOSIS — R2681 Unsteadiness on feet: Secondary | ICD-10-CM | POA: Diagnosis not present

## 2022-02-13 DIAGNOSIS — R4789 Other speech disturbances: Secondary | ICD-10-CM | POA: Diagnosis not present

## 2022-02-13 DIAGNOSIS — M25559 Pain in unspecified hip: Secondary | ICD-10-CM | POA: Diagnosis not present

## 2022-02-13 DIAGNOSIS — G309 Alzheimer's disease, unspecified: Secondary | ICD-10-CM | POA: Diagnosis not present

## 2022-02-17 DIAGNOSIS — G309 Alzheimer's disease, unspecified: Secondary | ICD-10-CM | POA: Diagnosis not present

## 2022-02-17 DIAGNOSIS — R2681 Unsteadiness on feet: Secondary | ICD-10-CM | POA: Diagnosis not present

## 2022-02-17 DIAGNOSIS — M25559 Pain in unspecified hip: Secondary | ICD-10-CM | POA: Diagnosis not present

## 2022-02-17 DIAGNOSIS — R2689 Other abnormalities of gait and mobility: Secondary | ICD-10-CM | POA: Diagnosis not present

## 2022-02-17 DIAGNOSIS — R278 Other lack of coordination: Secondary | ICD-10-CM | POA: Diagnosis not present

## 2022-02-17 DIAGNOSIS — R4789 Other speech disturbances: Secondary | ICD-10-CM | POA: Diagnosis not present

## 2022-02-18 DIAGNOSIS — R4789 Other speech disturbances: Secondary | ICD-10-CM | POA: Diagnosis not present

## 2022-02-18 DIAGNOSIS — M25559 Pain in unspecified hip: Secondary | ICD-10-CM | POA: Diagnosis not present

## 2022-02-18 DIAGNOSIS — R2689 Other abnormalities of gait and mobility: Secondary | ICD-10-CM | POA: Diagnosis not present

## 2022-02-18 DIAGNOSIS — R2681 Unsteadiness on feet: Secondary | ICD-10-CM | POA: Diagnosis not present

## 2022-02-18 DIAGNOSIS — G309 Alzheimer's disease, unspecified: Secondary | ICD-10-CM | POA: Diagnosis not present

## 2022-02-18 DIAGNOSIS — R278 Other lack of coordination: Secondary | ICD-10-CM | POA: Diagnosis not present

## 2022-02-20 DIAGNOSIS — R278 Other lack of coordination: Secondary | ICD-10-CM | POA: Diagnosis not present

## 2022-02-20 DIAGNOSIS — R2689 Other abnormalities of gait and mobility: Secondary | ICD-10-CM | POA: Diagnosis not present

## 2022-02-20 DIAGNOSIS — R4789 Other speech disturbances: Secondary | ICD-10-CM | POA: Diagnosis not present

## 2022-02-20 DIAGNOSIS — R2681 Unsteadiness on feet: Secondary | ICD-10-CM | POA: Diagnosis not present

## 2022-02-20 DIAGNOSIS — M25559 Pain in unspecified hip: Secondary | ICD-10-CM | POA: Diagnosis not present

## 2022-02-20 DIAGNOSIS — G309 Alzheimer's disease, unspecified: Secondary | ICD-10-CM | POA: Diagnosis not present

## 2022-02-23 DIAGNOSIS — M25559 Pain in unspecified hip: Secondary | ICD-10-CM | POA: Diagnosis not present

## 2022-02-23 DIAGNOSIS — R2689 Other abnormalities of gait and mobility: Secondary | ICD-10-CM | POA: Diagnosis not present

## 2022-02-23 DIAGNOSIS — G309 Alzheimer's disease, unspecified: Secondary | ICD-10-CM | POA: Diagnosis not present

## 2022-02-23 DIAGNOSIS — R2681 Unsteadiness on feet: Secondary | ICD-10-CM | POA: Diagnosis not present

## 2022-02-23 DIAGNOSIS — R278 Other lack of coordination: Secondary | ICD-10-CM | POA: Diagnosis not present

## 2022-02-23 DIAGNOSIS — R4789 Other speech disturbances: Secondary | ICD-10-CM | POA: Diagnosis not present

## 2022-02-24 DIAGNOSIS — R2689 Other abnormalities of gait and mobility: Secondary | ICD-10-CM | POA: Diagnosis not present

## 2022-02-24 DIAGNOSIS — G309 Alzheimer's disease, unspecified: Secondary | ICD-10-CM | POA: Diagnosis not present

## 2022-02-24 DIAGNOSIS — R2681 Unsteadiness on feet: Secondary | ICD-10-CM | POA: Diagnosis not present

## 2022-02-24 DIAGNOSIS — R278 Other lack of coordination: Secondary | ICD-10-CM | POA: Diagnosis not present

## 2022-02-24 DIAGNOSIS — M25559 Pain in unspecified hip: Secondary | ICD-10-CM | POA: Diagnosis not present

## 2022-02-24 DIAGNOSIS — R4789 Other speech disturbances: Secondary | ICD-10-CM | POA: Diagnosis not present

## 2022-02-25 DIAGNOSIS — Z23 Encounter for immunization: Secondary | ICD-10-CM | POA: Diagnosis not present

## 2022-02-26 DIAGNOSIS — R2681 Unsteadiness on feet: Secondary | ICD-10-CM | POA: Diagnosis not present

## 2022-02-26 DIAGNOSIS — R4789 Other speech disturbances: Secondary | ICD-10-CM | POA: Diagnosis not present

## 2022-02-26 DIAGNOSIS — R2689 Other abnormalities of gait and mobility: Secondary | ICD-10-CM | POA: Diagnosis not present

## 2022-02-26 DIAGNOSIS — R278 Other lack of coordination: Secondary | ICD-10-CM | POA: Diagnosis not present

## 2022-02-26 DIAGNOSIS — M25559 Pain in unspecified hip: Secondary | ICD-10-CM | POA: Diagnosis not present

## 2022-02-26 DIAGNOSIS — G309 Alzheimer's disease, unspecified: Secondary | ICD-10-CM | POA: Diagnosis not present

## 2022-02-27 DIAGNOSIS — R2689 Other abnormalities of gait and mobility: Secondary | ICD-10-CM | POA: Diagnosis not present

## 2022-02-27 DIAGNOSIS — R2681 Unsteadiness on feet: Secondary | ICD-10-CM | POA: Diagnosis not present

## 2022-02-27 DIAGNOSIS — M25559 Pain in unspecified hip: Secondary | ICD-10-CM | POA: Diagnosis not present

## 2022-02-27 DIAGNOSIS — G309 Alzheimer's disease, unspecified: Secondary | ICD-10-CM | POA: Diagnosis not present

## 2022-02-27 DIAGNOSIS — R4789 Other speech disturbances: Secondary | ICD-10-CM | POA: Diagnosis not present

## 2022-02-27 DIAGNOSIS — R278 Other lack of coordination: Secondary | ICD-10-CM | POA: Diagnosis not present

## 2022-03-03 DIAGNOSIS — M25559 Pain in unspecified hip: Secondary | ICD-10-CM | POA: Diagnosis not present

## 2022-03-03 DIAGNOSIS — R2681 Unsteadiness on feet: Secondary | ICD-10-CM | POA: Diagnosis not present

## 2022-03-03 DIAGNOSIS — G309 Alzheimer's disease, unspecified: Secondary | ICD-10-CM | POA: Diagnosis not present

## 2022-03-03 DIAGNOSIS — R4789 Other speech disturbances: Secondary | ICD-10-CM | POA: Diagnosis not present

## 2022-03-03 DIAGNOSIS — R2689 Other abnormalities of gait and mobility: Secondary | ICD-10-CM | POA: Diagnosis not present

## 2022-03-03 DIAGNOSIS — R278 Other lack of coordination: Secondary | ICD-10-CM | POA: Diagnosis not present

## 2022-03-04 DIAGNOSIS — R278 Other lack of coordination: Secondary | ICD-10-CM | POA: Diagnosis not present

## 2022-03-04 DIAGNOSIS — G309 Alzheimer's disease, unspecified: Secondary | ICD-10-CM | POA: Diagnosis not present

## 2022-03-04 DIAGNOSIS — R4789 Other speech disturbances: Secondary | ICD-10-CM | POA: Diagnosis not present

## 2022-03-04 DIAGNOSIS — R2689 Other abnormalities of gait and mobility: Secondary | ICD-10-CM | POA: Diagnosis not present

## 2022-03-04 DIAGNOSIS — M25559 Pain in unspecified hip: Secondary | ICD-10-CM | POA: Diagnosis not present

## 2022-03-04 DIAGNOSIS — R2681 Unsteadiness on feet: Secondary | ICD-10-CM | POA: Diagnosis not present

## 2022-03-05 DIAGNOSIS — M25559 Pain in unspecified hip: Secondary | ICD-10-CM | POA: Diagnosis not present

## 2022-03-05 DIAGNOSIS — R2681 Unsteadiness on feet: Secondary | ICD-10-CM | POA: Diagnosis not present

## 2022-03-05 DIAGNOSIS — G309 Alzheimer's disease, unspecified: Secondary | ICD-10-CM | POA: Diagnosis not present

## 2022-03-05 DIAGNOSIS — R278 Other lack of coordination: Secondary | ICD-10-CM | POA: Diagnosis not present

## 2022-03-05 DIAGNOSIS — R4789 Other speech disturbances: Secondary | ICD-10-CM | POA: Diagnosis not present

## 2022-03-05 DIAGNOSIS — R2689 Other abnormalities of gait and mobility: Secondary | ICD-10-CM | POA: Diagnosis not present

## 2022-03-06 DIAGNOSIS — M25559 Pain in unspecified hip: Secondary | ICD-10-CM | POA: Diagnosis not present

## 2022-03-06 DIAGNOSIS — R2681 Unsteadiness on feet: Secondary | ICD-10-CM | POA: Diagnosis not present

## 2022-03-06 DIAGNOSIS — R2689 Other abnormalities of gait and mobility: Secondary | ICD-10-CM | POA: Diagnosis not present

## 2022-03-06 DIAGNOSIS — R278 Other lack of coordination: Secondary | ICD-10-CM | POA: Diagnosis not present

## 2022-03-06 DIAGNOSIS — G309 Alzheimer's disease, unspecified: Secondary | ICD-10-CM | POA: Diagnosis not present

## 2022-03-06 DIAGNOSIS — R4789 Other speech disturbances: Secondary | ICD-10-CM | POA: Diagnosis not present

## 2022-03-11 DIAGNOSIS — R2681 Unsteadiness on feet: Secondary | ICD-10-CM | POA: Diagnosis not present

## 2022-03-11 DIAGNOSIS — R278 Other lack of coordination: Secondary | ICD-10-CM | POA: Diagnosis not present

## 2022-03-11 DIAGNOSIS — R4789 Other speech disturbances: Secondary | ICD-10-CM | POA: Diagnosis not present

## 2022-03-11 DIAGNOSIS — R2689 Other abnormalities of gait and mobility: Secondary | ICD-10-CM | POA: Diagnosis not present

## 2022-03-11 DIAGNOSIS — M25559 Pain in unspecified hip: Secondary | ICD-10-CM | POA: Diagnosis not present

## 2022-03-11 DIAGNOSIS — G309 Alzheimer's disease, unspecified: Secondary | ICD-10-CM | POA: Diagnosis not present

## 2022-03-13 DIAGNOSIS — R2689 Other abnormalities of gait and mobility: Secondary | ICD-10-CM | POA: Diagnosis not present

## 2022-03-13 DIAGNOSIS — M25559 Pain in unspecified hip: Secondary | ICD-10-CM | POA: Diagnosis not present

## 2022-03-13 DIAGNOSIS — R4789 Other speech disturbances: Secondary | ICD-10-CM | POA: Diagnosis not present

## 2022-03-13 DIAGNOSIS — R2681 Unsteadiness on feet: Secondary | ICD-10-CM | POA: Diagnosis not present

## 2022-03-13 DIAGNOSIS — R278 Other lack of coordination: Secondary | ICD-10-CM | POA: Diagnosis not present

## 2022-03-13 DIAGNOSIS — G309 Alzheimer's disease, unspecified: Secondary | ICD-10-CM | POA: Diagnosis not present

## 2022-03-18 DIAGNOSIS — G309 Alzheimer's disease, unspecified: Secondary | ICD-10-CM | POA: Diagnosis not present

## 2022-03-18 DIAGNOSIS — R4789 Other speech disturbances: Secondary | ICD-10-CM | POA: Diagnosis not present

## 2022-03-20 DIAGNOSIS — R4789 Other speech disturbances: Secondary | ICD-10-CM | POA: Diagnosis not present

## 2022-03-20 DIAGNOSIS — G309 Alzheimer's disease, unspecified: Secondary | ICD-10-CM | POA: Diagnosis not present

## 2022-03-25 DIAGNOSIS — R4789 Other speech disturbances: Secondary | ICD-10-CM | POA: Diagnosis not present

## 2022-03-25 DIAGNOSIS — G309 Alzheimer's disease, unspecified: Secondary | ICD-10-CM | POA: Diagnosis not present

## 2022-03-27 DIAGNOSIS — R4789 Other speech disturbances: Secondary | ICD-10-CM | POA: Diagnosis not present

## 2022-03-27 DIAGNOSIS — G309 Alzheimer's disease, unspecified: Secondary | ICD-10-CM | POA: Diagnosis not present

## 2022-03-31 DIAGNOSIS — R4789 Other speech disturbances: Secondary | ICD-10-CM | POA: Diagnosis not present

## 2022-03-31 DIAGNOSIS — G309 Alzheimer's disease, unspecified: Secondary | ICD-10-CM | POA: Diagnosis not present

## 2022-04-02 DIAGNOSIS — G309 Alzheimer's disease, unspecified: Secondary | ICD-10-CM | POA: Diagnosis not present

## 2022-04-02 DIAGNOSIS — R4789 Other speech disturbances: Secondary | ICD-10-CM | POA: Diagnosis not present

## 2022-04-15 DIAGNOSIS — R2681 Unsteadiness on feet: Secondary | ICD-10-CM | POA: Diagnosis not present

## 2022-04-15 DIAGNOSIS — M6281 Muscle weakness (generalized): Secondary | ICD-10-CM | POA: Diagnosis not present

## 2022-04-15 DIAGNOSIS — G309 Alzheimer's disease, unspecified: Secondary | ICD-10-CM | POA: Diagnosis not present

## 2022-04-16 DIAGNOSIS — R2681 Unsteadiness on feet: Secondary | ICD-10-CM | POA: Diagnosis not present

## 2022-04-16 DIAGNOSIS — M6281 Muscle weakness (generalized): Secondary | ICD-10-CM | POA: Diagnosis not present

## 2022-04-16 DIAGNOSIS — G309 Alzheimer's disease, unspecified: Secondary | ICD-10-CM | POA: Diagnosis not present

## 2022-04-17 DIAGNOSIS — G309 Alzheimer's disease, unspecified: Secondary | ICD-10-CM | POA: Diagnosis not present

## 2022-04-17 DIAGNOSIS — R2681 Unsteadiness on feet: Secondary | ICD-10-CM | POA: Diagnosis not present

## 2022-04-17 DIAGNOSIS — M6281 Muscle weakness (generalized): Secondary | ICD-10-CM | POA: Diagnosis not present

## 2022-04-20 ENCOUNTER — Encounter: Payer: Self-pay | Admitting: Orthopedic Surgery

## 2022-04-20 ENCOUNTER — Non-Acute Institutional Stay: Payer: Medicare Other | Admitting: Orthopedic Surgery

## 2022-04-20 DIAGNOSIS — G309 Alzheimer's disease, unspecified: Secondary | ICD-10-CM | POA: Diagnosis not present

## 2022-04-20 DIAGNOSIS — I10 Essential (primary) hypertension: Secondary | ICD-10-CM

## 2022-04-20 DIAGNOSIS — G8929 Other chronic pain: Secondary | ICD-10-CM | POA: Diagnosis not present

## 2022-04-20 DIAGNOSIS — J3089 Other allergic rhinitis: Secondary | ICD-10-CM

## 2022-04-20 DIAGNOSIS — F0150 Vascular dementia without behavioral disturbance: Secondary | ICD-10-CM | POA: Diagnosis not present

## 2022-04-20 DIAGNOSIS — R2681 Unsteadiness on feet: Secondary | ICD-10-CM | POA: Diagnosis not present

## 2022-04-20 DIAGNOSIS — M546 Pain in thoracic spine: Secondary | ICD-10-CM

## 2022-04-20 DIAGNOSIS — M810 Age-related osteoporosis without current pathological fracture: Secondary | ICD-10-CM | POA: Diagnosis not present

## 2022-04-20 DIAGNOSIS — M6281 Muscle weakness (generalized): Secondary | ICD-10-CM | POA: Diagnosis not present

## 2022-04-20 NOTE — Progress Notes (Signed)
Location:   [place] Room Number: [place] of Service:  ALF [phone]) Provider:  Windell Moulding, NP  PCP: Virgie Dad, MD  Patient Care Team: Virgie Dad, MD as PCP - General (Internal Medicine) Pa, [facility] (Ophthalmology) Dermatology, [facility] (Dermatology)  Extended Emergency Contact Information Primary Emergency Contact: Arelia Sneddon [place] Phone: [phone] Mobile Phone: [phone] Relation: Daughter  Code Status:  DNR Goals of care: Advanced Directive information    04/20/2022   11:36 AM  Advanced Directives  Does Patient Have a Medical Advance Directive? Yes  Type of Paramedic of [place];Living will;Out of facility DNR (pink MOST or yellow form)  Does patient want to make changes to medical advance directive? No - Patient declined  Copy of [place] in Chart? Yes - validated most recent copy scanned in chart (See row information)     Chief Complaint  Patient presents with   Medical Management of Chronic Issues    Routine Visit.    Health Maintenance    Discuss the need for Dexa scan.   Immunizations    Discuss the need for Shingrix vaccine, and Influenza vaccine.    HPI:  Pt is a 88 y.o. female seen today for medical management of chronic diseases.    She continues to reside in assisted living at [facility]. PMH: HTN, venous insufficiency, GERD, dementia, HLD, and diverticulitis.    Vascular dementia- MMSE 22/30 10/2020, MRI brain 2022 indicated mild age related cerebral atrophy with chronic small vessel disease, no recent behavioral outbursts, remains on Namenda  HTN- BUN/creat 20/0.9 10/21/2021, not on medications, see below Chronic back pain- denies pain today, remains on scheduled tylenol and voltaren gel Osteoporosis- no additional workup per goals of care, remains on calcium supplement Allergies- remains on Claritin and Flonase  No recent falls  or injuries.   Recent blood pressures:   08/02- 132/78  07/26- 129/72  07/19- 102/64  Recent weights:  08/02- 150.8 lbs  07/03- 151.6 lbs  06/01- 153.4 lbs   Past Medical History:  Diagnosis Date   Anxiety    Cataract    Chest pain 02/16/2010   Myoview, negative for pharmacologic-stress induced ischemia, EF 74   Complication of anesthesia 1954   hard to wake up   Diverticulosis    DUB (dysfunctional uterine bleeding)    Dysrhythmia    h/o PVCs- none recently- no meds   Fall    GERD (gastroesophageal reflux disease)    Hearing reduced    Hypertension    no meds currently   Loss of consciousness ([place])    Memory loss    Osteoarthritis of left hip    Osteoporosis    Palpitation 02/02/2013   A CardioNet MCOT (mobile cardiac outpatient telemetry ): NSR with PACs; sinus tachycardia with ventricular trigeminy. Brief atrial run. Facet cardiac; ventricular trigeminy and accelerated idioventricular rhythm. No significant tachyarrhythmias.   Palpitations    Postmenopausal bleeding 11/01/2014   Unsteady gait    Varicose veins of both legs with pain    Past Surgical History:  Procedure Laterality Date   APPENDECTOMY     CATARACT EXTRACTION     both eyes   CESAREAN SECTION  1962, 1963, [phone]   x 4   COLONOSCOPY  01/14/2017   DIAGNOSTIC LAPAROSCOPY     ECTOPIC PREGNANCY SURGERY  1966   FRACTURE SURGERY     HIP SURGERY Right    HYSTEROSCOPY  WITH D & C N/A 11/02/2014   Procedure: DILATATION AND CURETTAGE /HYSTEROSCOPY;  Surgeon: Janyth Contes, MD;  Location: [facility];  Service: Gynecology;  Laterality: N/A;   Lower Extremity Venous Dopplers  01/2013   No thrombus or thrombophlebitis. R Greater Saphenous Vein - enlarged without other insufficiency (6 mm); right and left Small Saphenous Vein: Patent no valve insufficiency.   TONSILLECTOMY     TONSILLECTOMY AND ADENOIDECTOMY     TRANSTHORACIC ECHOCARDIOGRAM  01/2013   Normal LV size and function. EF 55-60%. Grade 1  diastolic dysfunction. Elevated LV filling pressures. No regional W. May.    Allergies  Allergen Reactions   Evista [Raloxifene]    Fosamax [Alendronate]    Penicillins    Polyoxyethylene Lauryl Ether [Sorbitan]    Moxifloxacin Rash    Allergies as of 04/20/2022       Reactions   Evista [raloxifene]    Fosamax [alendronate]    Penicillins    Polyoxyethylene Lauryl Ether [sorbitan]    Moxifloxacin Rash        Medication List        Accurate as of April 20, 2022 11:36 AM. If you have any questions, ask your nurse or doctor.          acetaminophen 500 MG tablet Commonly known as: TYLENOL Take 1,000 mg by mouth 3 (three) times daily.   CALCIUM 600 PO Take 1 tablet by mouth daily.   calcium carbonate 500 MG chewable tablet Commonly known as: TUMS - dosed in mg elemental calcium Chew 1 tablet by mouth 2 (two) times daily as needed for indigestion or heartburn.   CeraVe Lotn Apply 1 application. topically daily.   diclofenac Sodium 1 % Gel Commonly known as: VOLTAREN Apply 1 g topically 3 (three) times daily.   fluticasone 50 MCG/ACT nasal spray Commonly known as: FLONASE Place 2 sprays into both nostrils daily.   loperamide 2 MG tablet Commonly known as: IMODIUM A-D Take 2 mg by mouth as needed for diarrhea or loose stools.   loratadine 10 MG tablet Commonly known as: CLARITIN Take 10 mg by mouth in the morning.   memantine 10 MG tablet Commonly known as: NAMENDA Take 10 mg by mouth 2 (two) times daily.   MULTIPLE VITAMIN PO Take by mouth daily.   Robafen DM 10-100 MG/5ML liquid Generic drug: Dextromethorphan-guaiFENesin Take 5 mLs by mouth every 6 (six) hours as needed.        Review of Systems  Unable to perform ROS: Dementia    Immunization History  Administered Date(s) Administered   Influenza, Quadrivalent, Recombinant, Inj, Pf 06/18/2018, 05/04/2019, 06/15/2020   Influenza-Unspecified 09/15/2019, 07/08/2021   Moderna Sars-Covid-2  Vaccination 09/15/2019   PFIZER(Purple Top)SARS-COV-2 Vaccination 10/03/2019, 10/22/2019, 09/03/2020   Pfizer Covid-19 Vaccine Bivalent Booster 68yr & up 06/04/2021   Pneumococcal Conjugate-13 05/28/2014   Pneumococcal Polysaccharide-23 07/15/1998, 06/15/2005   Tdap 05/26/2016   Unspecified SARS-COV-2 Vaccination 03/03/2022   Zoster, Live 05/18/2013, 05/28/2014   Pertinent  Health Maintenance Due  Topic Date Due   DEXA SCAN  Never done   INFLUENZA VACCINE  04/14/2022      03/30/2017   11:37 AM 07/20/2017   11:09 AM 11/03/2018    1:24 PM 10/23/2019    1:44 PM 05/14/2021    1:40 PM  Fall Risk  Falls in the past year? No No  0 1  Was there an injury with Fall?     0  Fall Risk Category Calculator     1  Fall Risk Category     Low  Patient Fall Risk Level   Low fall risk  Moderate fall risk  Patient at Risk for Falls Due to     History of fall(s);Impaired balance/gait  Fall risk Follow up     Falls evaluation completed;Education provided;Falls prevention discussed   Functional Status Survey:    Vitals:   04/20/22 1133  BP: 132/78  Pulse: 66  Resp: 18  Temp: (!) 97.3 F (36.3 C)  SpO2: 98%  Weight: 150 lb 12.8 oz (68.4 kg)  Height: '5\' 5"'$  (1.651 m)   Body mass index is 25.09 kg/m. Physical Exam Vitals reviewed.  Constitutional:      General: She is not in acute distress. HENT:     Head: Normocephalic.     Right Ear: There is no impacted cerumen.     Left Ear: There is no impacted cerumen.     Nose: Nose normal.     Mouth/Throat:     Mouth: Mucous membranes are moist.  Eyes:     General:        Right eye: No discharge.        Left eye: No discharge.  Cardiovascular:     Rate and Rhythm: Normal rate and regular rhythm.     Pulses: Normal pulses.     Heart sounds: Normal heart sounds.  Pulmonary:     Effort: Pulmonary effort is normal. No respiratory distress.     Breath sounds: Normal breath sounds. No wheezing.  Abdominal:     General: Bowel sounds are normal.  There is no distension.     Palpations: Abdomen is soft.     Tenderness: There is no abdominal tenderness.  Musculoskeletal:     Cervical back: Neck supple.     Right lower leg: No edema.     Left lower leg: No edema.  Skin:    General: Skin is warm and dry.     Capillary Refill: Capillary refill takes less than 2 seconds.  Neurological:     General: No focal deficit present.     Mental Status: She is alert. Mental status is at baseline.     Motor: Weakness present.     Gait: Gait abnormal.     Comments: walker  Psychiatric:        Mood and Affect: Mood normal.        Behavior: Behavior normal.     Labs reviewed: Recent Labs    10/21/21 0000  NA 140  K 4.5  CL 107  CO2 25*  BUN 20  CREATININE 0.9  CALCIUM 9.1   Recent Labs    10/21/21 0000  AST 18  ALT 12  ALKPHOS 96  ALBUMIN 3.6   Recent Labs    10/21/21 0000  WBC 4.7  NEUTROABS 2,472.00  HGB 12.7  HCT 38  PLT 251   Lab Results  Component Value Date   TSH 4.09 10/21/2021   No results found for: "HGBA1C" Lab Results  Component Value Date   CHOL 156 08/27/2020   HDL 69 08/27/2020   LDLCALC 75 08/27/2020   TRIG 62 08/27/2020   CHOLHDL 2.8 02/15/2010    Significant Diagnostic Results in last 30 days:  No results found.  Assessment/Plan 1. Vascular dementia without behavioral disturbance (HCC) - no behaviors - doing well in AL - ambulates well with walker - cont Namenda  2. Essential hypertension - labile - controlled without medication - cmp- future - cbc/diff- future  3. Chronic midline thoracic back pain - ongoing - cont tylenol and voltaren gel  4. Age-related osteoporosis without current pathological fracture - no further studies per goals of care - cont calcium  5. Non-seasonal allergic rhinitis due to other allergic trigger - cont Flonase and Claritin    Family/ staff Communication: plan discussed with patient and nurse  Labs/tests ordered:  cbc/diff, cmp

## 2022-04-22 DIAGNOSIS — R2681 Unsteadiness on feet: Secondary | ICD-10-CM | POA: Diagnosis not present

## 2022-04-22 DIAGNOSIS — M6281 Muscle weakness (generalized): Secondary | ICD-10-CM | POA: Diagnosis not present

## 2022-04-22 DIAGNOSIS — G309 Alzheimer's disease, unspecified: Secondary | ICD-10-CM | POA: Diagnosis not present

## 2022-04-23 DIAGNOSIS — D649 Anemia, unspecified: Secondary | ICD-10-CM | POA: Diagnosis not present

## 2022-04-23 LAB — BASIC METABOLIC PANEL
BUN: 27 — AB (ref 4–21)
CO2: 26 — AB (ref 13–22)
Chloride: 107 (ref 99–108)
Creatinine: 0.8 (ref 0.5–1.1)
Glucose: 68
Potassium: 4.4 mEq/L (ref 3.5–5.1)
Sodium: 138 (ref 137–147)

## 2022-04-23 LAB — CBC AND DIFFERENTIAL
HCT: 35 — AB (ref 36–46)
Hemoglobin: 11.6 — AB (ref 12.0–16.0)
Neutrophils Absolute: 2277
Platelets: 203 10*3/uL (ref 150–400)
WBC: 4.6

## 2022-04-23 LAB — HEPATIC FUNCTION PANEL
ALT: 10 U/L (ref 7–35)
AST: 15 (ref 13–35)

## 2022-04-23 LAB — COMPREHENSIVE METABOLIC PANEL
Albumin: 3.3 — AB (ref 3.5–5.0)
Calcium: 8.9 (ref 8.7–10.7)
Globulin: 2.1
eGFR: 66

## 2022-04-23 LAB — CBC: RBC: 3.82 — AB (ref 3.87–5.11)

## 2022-04-24 DIAGNOSIS — R2681 Unsteadiness on feet: Secondary | ICD-10-CM | POA: Diagnosis not present

## 2022-04-24 DIAGNOSIS — M6281 Muscle weakness (generalized): Secondary | ICD-10-CM | POA: Diagnosis not present

## 2022-04-24 DIAGNOSIS — G309 Alzheimer's disease, unspecified: Secondary | ICD-10-CM | POA: Diagnosis not present

## 2022-04-27 DIAGNOSIS — G309 Alzheimer's disease, unspecified: Secondary | ICD-10-CM | POA: Diagnosis not present

## 2022-04-27 DIAGNOSIS — R2681 Unsteadiness on feet: Secondary | ICD-10-CM | POA: Diagnosis not present

## 2022-04-27 DIAGNOSIS — M6281 Muscle weakness (generalized): Secondary | ICD-10-CM | POA: Diagnosis not present

## 2022-04-30 DIAGNOSIS — M6281 Muscle weakness (generalized): Secondary | ICD-10-CM | POA: Diagnosis not present

## 2022-04-30 DIAGNOSIS — G309 Alzheimer's disease, unspecified: Secondary | ICD-10-CM | POA: Diagnosis not present

## 2022-04-30 DIAGNOSIS — R2681 Unsteadiness on feet: Secondary | ICD-10-CM | POA: Diagnosis not present

## 2022-05-01 DIAGNOSIS — R2681 Unsteadiness on feet: Secondary | ICD-10-CM | POA: Diagnosis not present

## 2022-05-01 DIAGNOSIS — G309 Alzheimer's disease, unspecified: Secondary | ICD-10-CM | POA: Diagnosis not present

## 2022-05-01 DIAGNOSIS — M6281 Muscle weakness (generalized): Secondary | ICD-10-CM | POA: Diagnosis not present

## 2022-05-04 DIAGNOSIS — R2681 Unsteadiness on feet: Secondary | ICD-10-CM | POA: Diagnosis not present

## 2022-05-04 DIAGNOSIS — M6281 Muscle weakness (generalized): Secondary | ICD-10-CM | POA: Diagnosis not present

## 2022-05-04 DIAGNOSIS — G309 Alzheimer's disease, unspecified: Secondary | ICD-10-CM | POA: Diagnosis not present

## 2022-05-06 DIAGNOSIS — M6281 Muscle weakness (generalized): Secondary | ICD-10-CM | POA: Diagnosis not present

## 2022-05-06 DIAGNOSIS — G309 Alzheimer's disease, unspecified: Secondary | ICD-10-CM | POA: Diagnosis not present

## 2022-05-06 DIAGNOSIS — R2681 Unsteadiness on feet: Secondary | ICD-10-CM | POA: Diagnosis not present

## 2022-05-11 DIAGNOSIS — M6281 Muscle weakness (generalized): Secondary | ICD-10-CM | POA: Diagnosis not present

## 2022-05-11 DIAGNOSIS — G309 Alzheimer's disease, unspecified: Secondary | ICD-10-CM | POA: Diagnosis not present

## 2022-05-11 DIAGNOSIS — R2681 Unsteadiness on feet: Secondary | ICD-10-CM | POA: Diagnosis not present

## 2022-05-12 DIAGNOSIS — M6281 Muscle weakness (generalized): Secondary | ICD-10-CM | POA: Diagnosis not present

## 2022-05-12 DIAGNOSIS — G309 Alzheimer's disease, unspecified: Secondary | ICD-10-CM | POA: Diagnosis not present

## 2022-05-12 DIAGNOSIS — R2681 Unsteadiness on feet: Secondary | ICD-10-CM | POA: Diagnosis not present

## 2022-05-13 DIAGNOSIS — M6281 Muscle weakness (generalized): Secondary | ICD-10-CM | POA: Diagnosis not present

## 2022-05-13 DIAGNOSIS — G309 Alzheimer's disease, unspecified: Secondary | ICD-10-CM | POA: Diagnosis not present

## 2022-05-13 DIAGNOSIS — R2681 Unsteadiness on feet: Secondary | ICD-10-CM | POA: Diagnosis not present

## 2022-05-14 DIAGNOSIS — G309 Alzheimer's disease, unspecified: Secondary | ICD-10-CM | POA: Diagnosis not present

## 2022-05-14 DIAGNOSIS — M6281 Muscle weakness (generalized): Secondary | ICD-10-CM | POA: Diagnosis not present

## 2022-05-14 DIAGNOSIS — R2681 Unsteadiness on feet: Secondary | ICD-10-CM | POA: Diagnosis not present

## 2022-05-19 DIAGNOSIS — G309 Alzheimer's disease, unspecified: Secondary | ICD-10-CM | POA: Diagnosis not present

## 2022-05-19 DIAGNOSIS — R2681 Unsteadiness on feet: Secondary | ICD-10-CM | POA: Diagnosis not present

## 2022-05-19 DIAGNOSIS — M6281 Muscle weakness (generalized): Secondary | ICD-10-CM | POA: Diagnosis not present

## 2022-05-20 DIAGNOSIS — M6281 Muscle weakness (generalized): Secondary | ICD-10-CM | POA: Diagnosis not present

## 2022-05-20 DIAGNOSIS — R2681 Unsteadiness on feet: Secondary | ICD-10-CM | POA: Diagnosis not present

## 2022-05-20 DIAGNOSIS — G309 Alzheimer's disease, unspecified: Secondary | ICD-10-CM | POA: Diagnosis not present

## 2022-05-22 ENCOUNTER — Encounter: Payer: Self-pay | Admitting: Orthopedic Surgery

## 2022-05-22 ENCOUNTER — Non-Acute Institutional Stay: Payer: Medicare Other | Admitting: Orthopedic Surgery

## 2022-05-22 DIAGNOSIS — Z961 Presence of intraocular lens: Secondary | ICD-10-CM | POA: Diagnosis not present

## 2022-05-22 DIAGNOSIS — M6281 Muscle weakness (generalized): Secondary | ICD-10-CM | POA: Diagnosis not present

## 2022-05-22 DIAGNOSIS — Z0000 Encounter for general adult medical examination without abnormal findings: Secondary | ICD-10-CM | POA: Diagnosis not present

## 2022-05-22 DIAGNOSIS — G309 Alzheimer's disease, unspecified: Secondary | ICD-10-CM | POA: Diagnosis not present

## 2022-05-22 DIAGNOSIS — R2681 Unsteadiness on feet: Secondary | ICD-10-CM | POA: Diagnosis not present

## 2022-05-22 DIAGNOSIS — H524 Presbyopia: Secondary | ICD-10-CM | POA: Diagnosis not present

## 2022-05-22 NOTE — Progress Notes (Signed)
Subjective:   Melissa Valenzuela is a 88 y.o. female who presents for Medicare Annual (Subsequent) preventive examination.  Place of Service: [facility]- assisted living Provider: Windell Moulding, AGNP-C   Review of Systems     Cardiac Risk Factors include: advanced age (>74mn, >>49women);sedentary lifestyle;hypertension     Objective:    Today's Vitals   05/22/22 1433  BP: 135/83  Pulse: 70  Resp: 20  Temp: 98.4 F (36.9 C)  SpO2: 97%  Weight: 151 lb (68.5 kg)  Height: '5\' 5"'$  (1.651 m)   Body mass index is 25.13 kg/m.     04/20/2022   11:36 AM 01/22/2022   11:01 AM 10/17/2021    1:42 PM 09/11/2021    2:04 PM 05/14/2021   11:02 AM 04/16/2021   10:41 AM 11/03/2018    1:24 PM  Advanced Directives  Does Patient Have a Medical Advance Directive? Yes Yes Yes Yes Yes No No  Type of AParamedicof [place]pper [place] will;Out of facility DNR (pink MOST or yellow form) Out of facility DNR (pink MOST or yellow form);Living will Out of facility DNR (pink MOST or yellow form);[place] will Out of facility DNR (pink MOST or yellow form);HCeredoLiving will HRiver RoadLiving will;Out of facility DNR (pink MOST or yellow form)    Does patient want to make changes to medical advance directive? No - Patient declined No - Patient declined No - Patient declined No - Patient declined No - Patient declined    Copy of [place] Chart? Yes - validated most recent copy scanned in chart (See row information)  No - copy requested No - copy requested No - copy requested    Would patient like information on creating a medical advance directive?      No - Patient declined No - Patient declined  Pre-existing out of facility DNR order (yellow form or pink MOST form)  Yellow form placed in chart (order not valid for inpatient use) Yellow form placed in chart (order not valid for inpatient use) Yellow  form placed in chart (order not valid for inpatient use)       Current Medications (verified) Outpatient Encounter Medications as of 05/22/2022  Medication Sig   acetaminophen (TYLENOL) 500 MG tablet Take 1,000 mg by mouth 3 (three) times daily.   Calcium Carbonate (CALCIUM 600 PO) Take 1 tablet by mouth daily.   calcium carbonate (TUMS - DOSED IN MG ELEMENTAL CALCIUM) 500 MG chewable tablet Chew 1 tablet by mouth 2 (two) times daily as needed for indigestion or heartburn.   Dextromethorphan-guaiFENesin (ROBAFEN DM) 10-100 MG/5ML liquid Take 5 mLs by mouth every 6 (six) hours as needed.   diclofenac Sodium (VOLTAREN) 1 % GEL Apply 1 g topically 3 (three) times daily.   Emollient (CERAVE) LOTN Apply 1 application. topically daily.   fluticasone (FLONASE) 50 MCG/ACT nasal spray Place 2 sprays into both nostrils daily.   loperamide (IMODIUM A-D) 2 MG tablet Take 2 mg by mouth as needed for diarrhea or loose stools.   loratadine (CLARITIN) 10 MG tablet Take 10 mg by mouth in the morning.   memantine (NAMENDA) 10 MG tablet Take 10 mg by mouth 2 (two) times daily.   MULTIPLE VITAMIN PO Take by mouth daily.   No facility-administered encounter medications on file as of 05/22/2022.    Allergies (verified) Evista [raloxifene], Fosamax [alendronate], Penicillins, Polyoxyethylene lauryl ether [sorbitan], and Moxifloxacin   History:  Past Medical History:  Diagnosis Date   Anxiety    Cataract    Chest pain 02/16/2010   Myoview, negative for pharmacologic-stress induced ischemia, EF 74   Complication of anesthesia 1954   hard to wake up   Diverticulosis    DUB (dysfunctional uterine bleeding)    Dysrhythmia    h/o PVCs- none recently- no meds   Fall    GERD (gastroesophageal reflux disease)    Hearing reduced    Hypertension    no meds currently   Loss of consciousness ([place])    Memory loss    Osteoarthritis of left hip    Osteoporosis    Palpitation 02/02/2013   A CardioNet MCOT (mobile  cardiac outpatient telemetry ): NSR with PACs; sinus tachycardia with ventricular trigeminy. Brief atrial run. Facet cardiac; ventricular trigeminy and accelerated idioventricular rhythm. No significant tachyarrhythmias.   Palpitations    Postmenopausal bleeding 11/01/2014   Unsteady gait    Varicose veins of both legs with pain    Past Surgical History:  Procedure Laterality Date   APPENDECTOMY     CATARACT EXTRACTION     both eyes   CESAREAN SECTION  1962, 1963, [id number]   x 4   COLONOSCOPY  01/14/2017   DIAGNOSTIC LAPAROSCOPY     ECTOPIC PREGNANCY SURGERY  1966   FRACTURE SURGERY     HIP SURGERY Right    HYSTEROSCOPY WITH D & C N/A 11/02/2014   Procedure: DILATATION AND CURETTAGE /HYSTEROSCOPY;  Surgeon: Janyth Contes, MD;  Location: [facility];  Service: Gynecology;  Laterality: N/A;   Lower Extremity Venous Dopplers  01/2013   No thrombus or thrombophlebitis. R Greater Saphenous Vein - enlarged without other insufficiency (6 mm); right and left Small Saphenous Vein: Patent no valve insufficiency.   TONSILLECTOMY     TONSILLECTOMY AND ADENOIDECTOMY     TRANSTHORACIC ECHOCARDIOGRAM  01/2013   Normal LV size and function. EF 55-60%. Grade 1 diastolic dysfunction. Elevated LV filling pressures. No regional W. May.   Family History  Problem Relation Age of Onset   Stroke Mother 1   Hypertension Mother    Colon cancer Mother 57   Hypertension Father    Stroke Father        2   Dementia Father    Dementia Sister    Hypertension Son 32       Started on BP medicine age [age]   Social History   Socioeconomic History   Marital status: Widowed    Spouse name: Not on file   Number of children: Not on file   Years of education: Not on file   Highest education level: Not on file  Occupational History   Not on file  Tobacco Use   Smoking status: Never   Smokeless tobacco: Never  Vaping Use   Vaping Use: Never used  Substance and Sexual Activity   Alcohol use: Yes     Alcohol/week: 7.0 standard drinks of alcohol    Types: 7 Standard drinks or equivalent per week    Comment: occasionally   Drug use: No   Sexual activity: Not on file  Other Topics Concern   Not on file  Social History Narrative   Tobaccco-None Never a tobacco user.   Alcohol use <1 drink per week.    Regular diet.    Patient does consume caffeine-Coffee and tea.   Marital status-Widow, married since 66.   Patient lives alone. No pets.    Past profession housewife, research  lab tech.   No exercise.    No DNR form and does not want to discuss one.    Patient has a HPOA          Social Determinants of Health   Financial Resource Strain: Low Risk  (05/22/2022)   Overall Financial Resource Strain (CARDIA)    Difficulty of Paying Living Expenses: Not hard at all  Food Insecurity: No Food Insecurity (05/22/2022)   Hunger Vital Sign    Worried About Running Out of Food in the Last Year: Never true    Ran Out of Food in the Last Year: Never true  Transportation Needs: No Transportation Needs (05/22/2022)   PRAPARE - Hydrologist (Medical): No    Lack of Transportation (Non-Medical): No  Physical Activity: Inactive (05/22/2022)   Exercise Vital Sign    Days of Exercise per Week: 0 days    Minutes of Exercise per Session: 0 min  Stress: No Stress Concern Present (05/22/2022)   [place]    Feeling of Stress : Not at all  Social Connections: Socially Isolated (05/22/2022)   Social Connection and Isolation Panel [NHANES]    Frequency of Communication with Friends and Family: Three times a week    Frequency of Social Gatherings with Friends and Family: More than three times a week    Attends Religious Services: Never    Marine scientist or Organizations: No    Attends Archivist Meetings: Never    Marital Status: Widowed    Tobacco Counseling Counseling given: Not  Answered   Clinical Intake:  Pre-visit preparation completed: Yes  Pain : No/denies pain     BMI - recorded: 25.13 Nutritional Status: BMI 25 -29 Overweight Diabetes: No  How often do you need to have someone help you when you read instructions, pamphlets, or other written materials from your doctor or pharmacy?: 4 - Often What is the last grade level you completed in school?: college  Diabetic?No  Interpreter Needed?: No      Activities of Daily Living    05/22/2022    2:37 PM  In your present state of health, do you have any difficulty performing the following activities:  Hearing? 0  Vision? 0  Difficulty concentrating or making decisions? 1  Walking or climbing stairs? 0  Dressing or bathing? 0  Doing errands, shopping? 1  Preparing Food and eating ? Y  Using the Toilet? N  In the past six months, have you accidently leaked urine? N  Do you have problems with loss of bowel control? N  Managing your Medications? Y  Managing your Finances? Y  Housekeeping or managing your Housekeeping? Y    Patient Care Team: Virgie Dad, MD as PCP - General (Internal Medicine) [place], [facility] (Ophthalmology) Dermatology, [facility] (Dermatology)  Indicate any recent Medical Services you may have received from other than [facility] providers in the past year (date may be approximate).     Assessment:   This is a routine wellness examination for Zollie.  Hearing/Vision screen No results found.  Dietary issues and exercise activities discussed: Current Exercise Habits: The patient does not participate in regular exercise at present, Exercise limited by: neurologic condition(s)   Goals Addressed             This Visit's Progress    Maintain Mobility and Function   On track    Evidence-based guidance:  Emphasize  the importance of physical activity and aerobic exercise as included in treatment plan; assess barriers to adherence; consider patient's abilities and  preferences.  Encourage gradual increase in activity or exercise instead of stopping if pain occurs.  Reinforce individual therapy exercise prescription, such as strengthening, stabilization and stretching programs.  Promote optimal body mechanics to stabilize the spine with lifting and functional activity.  Encourage activity and mobility modifications to facilitate optimal function, such as using a log roll for bed mobility or dressing from a seated position.  Reinforce individual adaptive equipment recommendations to limit excessive spinal movements, such as a Systems analyst.  Assess adequacy of sleep; encourage use of sleep hygiene techniques, such as bedtime routine; use of white noise; dark, cool bedroom; avoiding daytime naps, heavy meals or exercise before bedtime.  Promote positions and modification to optimize sleep and sexual activity; consider pillows or positioning devices to assist in maintaining neutral spine.  Explore options for applying ergonomic principles at work and home, such as frequent position changes, using ergonomically designed equipment and working at optimal height.  Promote modifications to increase comfort with driving such as lumbar support, optimizing seat and steering wheel position, using cruise control and taking frequent rest stops to stretch and walk.   Notes:        Depression Screen    05/22/2022    2:37 PM 05/14/2021    1:37 PM  PHQ 2/9 Scores  PHQ - 2 Score 0 0    Fall Risk    05/22/2022    2:37 PM 05/14/2021    1:40 PM 10/23/2019    1:44 PM 07/20/2017   11:09 AM 03/30/2017   11:37 AM  Fall Risk   Falls in the past year? 0 1 0 No No  Number falls in past yr: 0 0     Injury with Fall? 0 0     Risk for fall due to : History of fall(s) History of fall(s);Impaired balance/gait     Follow up Falls evaluation completed;Education provided;Falls prevention discussed Falls evaluation completed;Education provided;Falls prevention discussed        FALL RISK PREVENTION PERTAINING TO THE HOME:  Any stairs in or around the home? No  If so, are there any without handrails? No  Home free of loose throw rugs in walkways, pet beds, electrical cords, etc? Yes  Adequate lighting in your home to reduce risk of falls? Yes   ASSISTIVE DEVICES UTILIZED TO PREVENT FALLS:  Life alert? No  Use of a cane, walker or w/c? No  Grab bars in the bathroom? Yes  Shower chair or bench in shower? Yes  Elevated toilet seat or a handicapped toilet? Yes   TIMED UP AND GO:  Was the test performed? No .  Length of time to ambulate 10 feet: N/A sec.   Gait slow and steady without use of assistive device  Cognitive Function:    05/22/2022    3:00 PM 05/14/2021    1:42 PM 07/25/2020    1:26 PM 01/22/2020    1:39 PM 10/23/2019    1:44 PM  MMSE - Mini Mental State Exam  Not completed: Refused Unable to complete     Orientation to time   '2 1 3  '$ Orientation to Place   '5 5 3  '$ Registration   '3 3 3  '$ Attention/ Calculation   '3 1 5  '$ Attention/Calculation-comments     couldnt do numbers  Recall   0 0 0  Language- name 2 objects  $'2 2 2  'e$ Language- repeat   '1 1 1  '$ Language- follow 3 step command   '3 3 3  '$ Language- read & follow direction   '1 1 1  '$ Write a sentence   '1 1 1  '$ Copy design   '1 1 1  '$ Copy design-comments    10 animals   Total score   '22 19 23        '$ 05/14/2021    1:42 PM  6CIT Screen  What Year? 4 points  What month? 3 points  What time? 3 points  Count back from 20 2 points  Months in reverse 2 points  Repeat phrase 6 points  Total Score 20 points    Immunizations Immunization History  Administered Date(s) Administered   Influenza, Quadrivalent, Recombinant, Inj, Pf 06/18/2018, 05/04/2019, 06/15/2020   Influenza-Unspecified 09/15/2019, 07/08/2021   Moderna Sars-Covid-2 Vaccination 09/15/2019   PFIZER(Purple Top)SARS-COV-2 Vaccination 10/03/2019, 10/22/2019, 09/03/2020   Pfizer Covid-19 Vaccine Bivalent Booster 39yr & up  06/04/2021   Pneumococcal Conjugate-13 05/28/2014   Pneumococcal Polysaccharide-23 07/15/1998, 06/15/2005   Tdap 05/26/2016   Unspecified SARS-COV-2 Vaccination 03/03/2022   Zoster, Live 05/18/2013, 05/28/2014    TDAP status: Up to date  Flu Vaccine status: Due, Education has been provided regarding the importance of this vaccine. Advised may receive this vaccine at local pharmacy or Health Dept. Aware to provide a copy of the vaccination record if obtained from local pharmacy or Health Dept. Verbalized acceptance and understanding.  Pneumococcal vaccine status: Up to date  Covid-19 vaccine status: Completed vaccines  Qualifies for Shingles Vaccine? Yes   Zostavax completed Yes   Shingrix Completed?: No.    Education has been provided regarding the importance of this vaccine. Patient has been advised to call insurance company to determine out of pocket expense if they have not yet received this vaccine. Advised may also receive vaccine at local pharmacy or Health Dept. Verbalized acceptance and understanding.  Screening Tests Health Maintenance  Topic Date Due   Zoster Vaccines- Shingrix (1 of 2) Never done   DEXA SCAN  Never done   INFLUENZA VACCINE  04/14/2022   TETANUS/TDAP  05/26/2026   Pneumonia Vaccine 88 Years old  Completed   COVID-19 Vaccine  Completed   HPV VACCINES  Aged Out    Health Maintenance  Health Maintenance Due  Topic Date Due   Zoster Vaccines- Shingrix (1 of 2) Never done   DEXA SCAN  Never done   INFLUENZA VACCINE  04/14/2022    Colorectal cancer screening: No longer required.   Mammogram status: No longer required due to advanced age.  Bone Density status: Completed 1999. Results reflect: Bone density results: NORMAL. Repeat every discontinued per goals of care years.  Lung Cancer Screening: (Low Dose CT Chest recommended if Age 86-80years, 30 pack-year currently smoking OR have quit w/in 15years.) does not qualify.   Lung Cancer Screening  Referral: No  Additional Screening:  Hepatitis C Screening:  qualify; Completed   Vision Screening: Recommended annual ophthalmology exams for early detection of glaucoma and other disorders of the eye. Is the patient up to date with their annual eye exam?  No  Who is the provider or what is the name of the office in which the patient attends annual eye exams? unknown If pt is not established with a provider, would they like to be referred to a provider to establish care? No .   Dental Screening: Recommended annual dental exams for proper oral hygiene  Community Resource Referral / Chronic Care Management: CRR required this visit?  No   CCM required this visit?  No      Plan:     I have personally reviewed and noted the following in the patient's chart:   Medical and social history Use of alcohol, tobacco or illicit drugs  Current medications and supplements including opioid prescriptions. Patient is not currently taking opioid prescriptions. Functional ability and status Nutritional status Physical activity Advanced directives List of other physicians Hospitalizations, surgeries, and ER visits in previous 12 months Vitals Screenings to include cognitive, depression, and falls Referrals and appointments  In addition, I have reviewed and discussed with patient certain preventive protocols, quality metrics, and best practice recommendations. A written personalized care plan for preventive services as well as general preventive health recommendations were provided to patient.     Yvonna Alanis, NP   05/22/2022   Nurse Notes: flu vaccine to be given at [facility] in October 2023.

## 2022-05-26 DIAGNOSIS — M6281 Muscle weakness (generalized): Secondary | ICD-10-CM | POA: Diagnosis not present

## 2022-05-26 DIAGNOSIS — G309 Alzheimer's disease, unspecified: Secondary | ICD-10-CM | POA: Diagnosis not present

## 2022-05-26 DIAGNOSIS — R2681 Unsteadiness on feet: Secondary | ICD-10-CM | POA: Diagnosis not present

## 2022-05-27 DIAGNOSIS — R2681 Unsteadiness on feet: Secondary | ICD-10-CM | POA: Diagnosis not present

## 2022-05-27 DIAGNOSIS — M6281 Muscle weakness (generalized): Secondary | ICD-10-CM | POA: Diagnosis not present

## 2022-05-27 DIAGNOSIS — G309 Alzheimer's disease, unspecified: Secondary | ICD-10-CM | POA: Diagnosis not present

## 2022-06-01 DIAGNOSIS — G309 Alzheimer's disease, unspecified: Secondary | ICD-10-CM | POA: Diagnosis not present

## 2022-06-01 DIAGNOSIS — R2681 Unsteadiness on feet: Secondary | ICD-10-CM | POA: Diagnosis not present

## 2022-06-01 DIAGNOSIS — M6281 Muscle weakness (generalized): Secondary | ICD-10-CM | POA: Diagnosis not present

## 2022-06-02 DIAGNOSIS — R2681 Unsteadiness on feet: Secondary | ICD-10-CM | POA: Diagnosis not present

## 2022-06-02 DIAGNOSIS — M6281 Muscle weakness (generalized): Secondary | ICD-10-CM | POA: Diagnosis not present

## 2022-06-02 DIAGNOSIS — G309 Alzheimer's disease, unspecified: Secondary | ICD-10-CM | POA: Diagnosis not present

## 2022-06-04 DIAGNOSIS — R2681 Unsteadiness on feet: Secondary | ICD-10-CM | POA: Diagnosis not present

## 2022-06-04 DIAGNOSIS — M6281 Muscle weakness (generalized): Secondary | ICD-10-CM | POA: Diagnosis not present

## 2022-06-04 DIAGNOSIS — G309 Alzheimer's disease, unspecified: Secondary | ICD-10-CM | POA: Diagnosis not present

## 2022-06-08 DIAGNOSIS — G309 Alzheimer's disease, unspecified: Secondary | ICD-10-CM | POA: Diagnosis not present

## 2022-06-08 DIAGNOSIS — M6281 Muscle weakness (generalized): Secondary | ICD-10-CM | POA: Diagnosis not present

## 2022-06-08 DIAGNOSIS — R2681 Unsteadiness on feet: Secondary | ICD-10-CM | POA: Diagnosis not present

## 2022-06-09 DIAGNOSIS — G309 Alzheimer's disease, unspecified: Secondary | ICD-10-CM | POA: Diagnosis not present

## 2022-06-09 DIAGNOSIS — M6281 Muscle weakness (generalized): Secondary | ICD-10-CM | POA: Diagnosis not present

## 2022-06-09 DIAGNOSIS — R2681 Unsteadiness on feet: Secondary | ICD-10-CM | POA: Diagnosis not present

## 2022-06-10 DIAGNOSIS — R2681 Unsteadiness on feet: Secondary | ICD-10-CM | POA: Diagnosis not present

## 2022-06-10 DIAGNOSIS — G309 Alzheimer's disease, unspecified: Secondary | ICD-10-CM | POA: Diagnosis not present

## 2022-06-10 DIAGNOSIS — M6281 Muscle weakness (generalized): Secondary | ICD-10-CM | POA: Diagnosis not present

## 2022-06-15 DIAGNOSIS — G309 Alzheimer's disease, unspecified: Secondary | ICD-10-CM | POA: Diagnosis not present

## 2022-06-15 DIAGNOSIS — R2681 Unsteadiness on feet: Secondary | ICD-10-CM | POA: Diagnosis not present

## 2022-06-15 DIAGNOSIS — M6281 Muscle weakness (generalized): Secondary | ICD-10-CM | POA: Diagnosis not present

## 2022-06-16 DIAGNOSIS — R2681 Unsteadiness on feet: Secondary | ICD-10-CM | POA: Diagnosis not present

## 2022-06-16 DIAGNOSIS — G309 Alzheimer's disease, unspecified: Secondary | ICD-10-CM | POA: Diagnosis not present

## 2022-06-16 DIAGNOSIS — M6281 Muscle weakness (generalized): Secondary | ICD-10-CM | POA: Diagnosis not present

## 2022-06-17 DIAGNOSIS — G309 Alzheimer's disease, unspecified: Secondary | ICD-10-CM | POA: Diagnosis not present

## 2022-06-17 DIAGNOSIS — M6281 Muscle weakness (generalized): Secondary | ICD-10-CM | POA: Diagnosis not present

## 2022-06-17 DIAGNOSIS — R2681 Unsteadiness on feet: Secondary | ICD-10-CM | POA: Diagnosis not present

## 2022-06-19 DIAGNOSIS — G309 Alzheimer's disease, unspecified: Secondary | ICD-10-CM | POA: Diagnosis not present

## 2022-06-19 DIAGNOSIS — M6281 Muscle weakness (generalized): Secondary | ICD-10-CM | POA: Diagnosis not present

## 2022-06-19 DIAGNOSIS — R2681 Unsteadiness on feet: Secondary | ICD-10-CM | POA: Diagnosis not present

## 2022-07-21 DIAGNOSIS — Z23 Encounter for immunization: Secondary | ICD-10-CM | POA: Diagnosis not present

## 2022-07-23 ENCOUNTER — Encounter: Payer: Self-pay | Admitting: Internal Medicine

## 2022-07-23 ENCOUNTER — Non-Acute Institutional Stay: Payer: Medicare Other | Admitting: Internal Medicine

## 2022-07-23 DIAGNOSIS — M810 Age-related osteoporosis without current pathological fracture: Secondary | ICD-10-CM

## 2022-07-23 DIAGNOSIS — I10 Essential (primary) hypertension: Secondary | ICD-10-CM

## 2022-07-23 DIAGNOSIS — F0150 Vascular dementia without behavioral disturbance: Secondary | ICD-10-CM | POA: Diagnosis not present

## 2022-07-23 NOTE — Progress Notes (Signed)
Location:  [facility] Room Number: AL/06/A Place of Service:     Provider:   Code Status: DNR Goals of Care:     04/20/2022   11:36 AM  Advanced Directives  Does Patient Have a Medical Advance Directive? Yes  Type of Paramedic of [place];Living will;Out of facility DNR (pink MOST or yellow form)  Does patient want to make changes to medical advance directive? No - Patient declined  Copy of [place] in Chart? Yes - validated most recent copy scanned in chart (See row information)     Chief Complaint  Patient presents with   Chronic Care Management    HPI: Patient is a 88 y.o. female seen today for medical management of chronic diseases.    [place]  Has h/o Dementia, HLD,Hypertension,  Diverticulitis and GERD    She is stable. No new Nursing issues. No Behavior issues Her weight is stable Walks with her walker No Falls Wt Readings from Last 3 Encounters:  07/23/22 151 lb (68.5 kg)  05/22/22 151 lb (68.5 kg)  04/20/22 150 lb 12.8 oz (68.4 kg)    Past Medical History:  Diagnosis Date   Anxiety    Cataract    Chest pain 02/16/2010   Myoview, negative for pharmacologic-stress induced ischemia, EF 74   Complication of anesthesia 1954   hard to wake up   Diverticulosis    DUB (dysfunctional uterine bleeding)    Dysrhythmia    h/o PVCs- none recently- no meds   Fall    GERD (gastroesophageal reflux disease)    Hearing reduced    Hypertension    no meds currently   Loss of consciousness ([place])    Memory loss    Osteoarthritis of left hip    Osteoporosis    Palpitation 02/02/2013   A CardioNet MCOT (mobile cardiac outpatient telemetry ): NSR with PACs; sinus tachycardia with ventricular trigeminy. Brief atrial run. Facet cardiac; ventricular trigeminy and accelerated idioventricular rhythm. No significant tachyarrhythmias.   Palpitations    Postmenopausal bleeding 11/01/2014    Unsteady gait    Varicose veins of both legs with pain     Past Surgical History:  Procedure Laterality Date   APPENDECTOMY     CATARACT EXTRACTION     both eyes   CESAREAN SECTION  1962, 1963, [id number]   x 4   COLONOSCOPY  01/14/2017   DIAGNOSTIC LAPAROSCOPY     ECTOPIC PREGNANCY SURGERY  1966   FRACTURE SURGERY     HIP SURGERY Right    HYSTEROSCOPY WITH D & C N/A 11/02/2014   Procedure: DILATATION AND CURETTAGE /HYSTEROSCOPY;  Surgeon: Janyth Contes, MD;  Location: [facility];  Service: Gynecology;  Laterality: N/A;   Lower Extremity Venous Dopplers  01/2013   No thrombus or thrombophlebitis. R Greater Saphenous Vein - enlarged without other insufficiency (6 mm); right and left Small Saphenous Vein: Patent no valve insufficiency.   TONSILLECTOMY     TONSILLECTOMY AND ADENOIDECTOMY     TRANSTHORACIC ECHOCARDIOGRAM  01/2013   Normal LV size and function. EF 55-60%. Grade 1 diastolic dysfunction. Elevated LV filling pressures. No regional W. May.    Allergies  Allergen Reactions   Evista [Raloxifene]    Fosamax [Alendronate]    Penicillins    Polyoxyethylene Lauryl Ether [Sorbitan]    Moxifloxacin Rash    Outpatient Encounter Medications as of 07/23/2022  Medication Sig   acetaminophen (TYLENOL) 500 MG  tablet Take 1,000 mg by mouth 3 (three) times daily.   Calcium Carbonate (CALCIUM 600 PO) Take 1 tablet by mouth daily.   calcium carbonate (TUMS - DOSED IN MG ELEMENTAL CALCIUM) 500 MG chewable tablet Chew 1 tablet by mouth 2 (two) times daily as needed for indigestion or heartburn.   Dextromethorphan-guaiFENesin (ROBAFEN DM) 10-100 MG/5ML liquid Take 5 mLs by mouth every 6 (six) hours as needed.   diclofenac Sodium (VOLTAREN) 1 % GEL Apply 1 g topically 3 (three) times daily.   Emollient (CERAVE) LOTN Apply 1 application. topically daily.   fluticasone (FLONASE) 50 MCG/ACT nasal spray Place 2 sprays into both nostrils daily.   loperamide (IMODIUM A-D) 2 MG tablet Take  2 mg by mouth as needed for diarrhea or loose stools.   loratadine (CLARITIN) 10 MG tablet Take 10 mg by mouth in the morning.   memantine (NAMENDA) 10 MG tablet Take 10 mg by mouth 2 (two) times daily.   MULTIPLE VITAMIN PO Take by mouth daily.   No facility-administered encounter medications on file as of 07/23/2022.    Review of Systems:  Review of Systems  Constitutional:  Negative for activity change and appetite change.  HENT: Negative.    Respiratory:  Negative for cough and shortness of breath.   Cardiovascular:  Negative for leg swelling.  Gastrointestinal:  Negative for constipation.  Genitourinary: Negative.   Musculoskeletal:  Negative for arthralgias, gait problem and myalgias.  Skin: Negative.   Neurological:  Negative for dizziness and weakness.  Psychiatric/Behavioral:  Negative for confusion, dysphoric mood and sleep disturbance.     Health Maintenance  Topic Date Due   Zoster Vaccines- Shingrix (1 of 2) Never done   DEXA SCAN  Never done   Medicare Annual Wellness (AWV)  05/23/2023   TETANUS/TDAP  05/26/2026   Pneumonia Vaccine 30+ Years old  Completed   INFLUENZA VACCINE  Completed   COVID-19 Vaccine  Completed   HPV VACCINES  Aged Out    Physical Exam: Vitals:   07/23/22 1446  BP: 134/70  Pulse: 71  Resp: 20  Temp: (!) 97 F (36.1 C)  SpO2: 98%  Weight: 151 lb (68.5 kg)  Height: '5\' 5"'$  (1.651 m)   Body mass index is 25.13 kg/m. Physical Exam Vitals reviewed.  Constitutional:      Appearance: Normal appearance.  HENT:     Head: Normocephalic.     Nose: Nose normal.     Mouth/Throat:     Mouth: Mucous membranes are moist.     Pharynx: Oropharynx is clear.  Eyes:     Pupils: Pupils are equal, round, and reactive to light.  Cardiovascular:     Rate and Rhythm: Normal rate and regular rhythm.     Pulses: Normal pulses.     Heart sounds: Normal heart sounds. No murmur heard. Pulmonary:     Effort: Pulmonary effort is normal.     Breath  sounds: Normal breath sounds.  Abdominal:     General: Abdomen is flat. Bowel sounds are normal.     Palpations: Abdomen is soft.  Musculoskeletal:        General: No swelling.     Cervical back: Neck supple.  Skin:    General: Skin is warm.  Neurological:     General: No focal deficit present.     Mental Status: She is alert.  Psychiatric:        Mood and Affect: Mood normal.        Thought  Content: Thought content normal.     Labs reviewed: Basic Metabolic Panel: Recent Labs    10/21/21 0000 04/23/22 0000  NA 140 138  K 4.5 4.4  CL 107 107  CO2 25* 26*  BUN 20 27*  CREATININE 0.9 0.8  CALCIUM 9.1 8.9  TSH 4.09  --    Liver Function Tests: Recent Labs    10/21/21 0000 04/23/22 0000  AST 18 15  ALT 12 10  ALKPHOS 96  --   ALBUMIN 3.6 3.3*   No results for input(s): "LIPASE", "AMYLASE" in the last 8760 hours. No results for input(s): "AMMONIA" in the last 8760 hours. CBC: Recent Labs    10/21/21 0000 04/23/22 0000  WBC 4.7 4.6  NEUTROABS 2,472.00 2,277.00  HGB 12.7 11.6*  HCT 38 35*  PLT 251 203   Lipid Panel: No results for input(s): "CHOL", "HDL", "LDLCALC", "TRIG", "CHOLHDL", "LDLDIRECT" in the last 8760 hours. No results found for: "HGBA1C"  Procedures since last visit: No results found.  Assessment/Plan 1. Vascular dementia without behavioral disturbance ([place]) On Namenda Weight stable Appropriate for AL  2. Essential hypertension - labile No Meds  3. Age-related osteoporosis without current pathological fracture No Treatment per Family    Labs/tests ordered:  * No order type specified * Next appt:  07/23/2022

## 2022-09-14 DIAGNOSIS — R2681 Unsteadiness on feet: Secondary | ICD-10-CM | POA: Diagnosis not present

## 2022-09-15 DIAGNOSIS — R2681 Unsteadiness on feet: Secondary | ICD-10-CM | POA: Diagnosis not present

## 2022-09-17 DIAGNOSIS — R2681 Unsteadiness on feet: Secondary | ICD-10-CM | POA: Diagnosis not present

## 2022-09-18 DIAGNOSIS — R2681 Unsteadiness on feet: Secondary | ICD-10-CM | POA: Diagnosis not present

## 2022-09-23 DIAGNOSIS — R2681 Unsteadiness on feet: Secondary | ICD-10-CM | POA: Diagnosis not present

## 2022-09-24 DIAGNOSIS — R2681 Unsteadiness on feet: Secondary | ICD-10-CM | POA: Diagnosis not present

## 2022-09-25 DIAGNOSIS — R2681 Unsteadiness on feet: Secondary | ICD-10-CM | POA: Diagnosis not present

## 2022-09-28 DIAGNOSIS — R2681 Unsteadiness on feet: Secondary | ICD-10-CM | POA: Diagnosis not present

## 2022-09-29 DIAGNOSIS — R2681 Unsteadiness on feet: Secondary | ICD-10-CM | POA: Diagnosis not present

## 2022-10-02 DIAGNOSIS — R2681 Unsteadiness on feet: Secondary | ICD-10-CM | POA: Diagnosis not present

## 2022-10-05 DIAGNOSIS — R2681 Unsteadiness on feet: Secondary | ICD-10-CM | POA: Diagnosis not present

## 2022-10-08 DIAGNOSIS — R2681 Unsteadiness on feet: Secondary | ICD-10-CM | POA: Diagnosis not present

## 2022-10-09 DIAGNOSIS — R2681 Unsteadiness on feet: Secondary | ICD-10-CM | POA: Diagnosis not present

## 2022-10-12 DIAGNOSIS — R2681 Unsteadiness on feet: Secondary | ICD-10-CM | POA: Diagnosis not present

## 2022-10-14 DIAGNOSIS — R2681 Unsteadiness on feet: Secondary | ICD-10-CM | POA: Diagnosis not present

## 2022-10-16 DIAGNOSIS — R2681 Unsteadiness on feet: Secondary | ICD-10-CM | POA: Diagnosis not present

## 2022-10-19 DIAGNOSIS — R2681 Unsteadiness on feet: Secondary | ICD-10-CM | POA: Diagnosis not present

## 2022-10-21 DIAGNOSIS — R2681 Unsteadiness on feet: Secondary | ICD-10-CM | POA: Diagnosis not present

## 2022-10-23 DIAGNOSIS — R2681 Unsteadiness on feet: Secondary | ICD-10-CM | POA: Diagnosis not present

## 2022-10-28 DIAGNOSIS — R2681 Unsteadiness on feet: Secondary | ICD-10-CM | POA: Diagnosis not present

## 2022-10-29 DIAGNOSIS — R2681 Unsteadiness on feet: Secondary | ICD-10-CM | POA: Diagnosis not present

## 2022-11-02 DIAGNOSIS — R2681 Unsteadiness on feet: Secondary | ICD-10-CM | POA: Diagnosis not present

## 2022-11-04 ENCOUNTER — Non-Acute Institutional Stay: Payer: Medicare Other | Admitting: Orthopedic Surgery

## 2022-11-04 ENCOUNTER — Encounter: Payer: Self-pay | Admitting: Orthopedic Surgery

## 2022-11-04 DIAGNOSIS — M546 Pain in thoracic spine: Secondary | ICD-10-CM | POA: Diagnosis not present

## 2022-11-04 DIAGNOSIS — J3089 Other allergic rhinitis: Secondary | ICD-10-CM | POA: Diagnosis not present

## 2022-11-04 DIAGNOSIS — F0150 Vascular dementia without behavioral disturbance: Secondary | ICD-10-CM

## 2022-11-04 DIAGNOSIS — I10 Essential (primary) hypertension: Secondary | ICD-10-CM

## 2022-11-04 DIAGNOSIS — M810 Age-related osteoporosis without current pathological fracture: Secondary | ICD-10-CM | POA: Diagnosis not present

## 2022-11-04 DIAGNOSIS — G8929 Other chronic pain: Secondary | ICD-10-CM | POA: Diagnosis not present

## 2022-11-04 DIAGNOSIS — R2681 Unsteadiness on feet: Secondary | ICD-10-CM | POA: Diagnosis not present

## 2022-11-04 NOTE — Progress Notes (Signed)
Location:   [place] Room Number: [place] of Service:  ALF [phone]) Provider:  Windell Moulding, NP  PCP: Virgie Dad, MD  Patient Care Team: Virgie Dad, MD as PCP - General (Internal Medicine) Pa, [facility] (Ophthalmology) Dermatology, [facility] (Dermatology)  Extended Emergency Contact Information Primary Emergency Contact: Arelia Sneddon [place] of [place] Phone: [phone] Mobile Phone: [phone] Relation: Daughter  Code Status:  DNR Goals of care: Advanced Directive information    11/04/2022   10:41 AM  Advanced Directives  Does Patient Have a Medical Advance Directive? Yes  Type of Paramedic of [place];Living will;Out of facility DNR (pink MOST or yellow form)  Does patient want to make changes to medical advance directive? No - Patient declined  Copy of [place] in Chart? Yes - validated most recent copy scanned in chart (See row information)     Chief Complaint  Patient presents with   Medical Management of Chronic Issues    Routine Visit.    Health Maintenance    Discuss the need for Dexa scan.    Immunizations    Discuss the need for Shingrix vaccine, and Covid Booster.    HPI:  Pt is a 89 y.o. female seen today for medical management of chronic diseases.    She continues to reside in assisted living at [facility]. PMH: HTN, HLD, venous insufficiency, GERD, dementia, right hip surgery, and diverticulitis.    HTN- BUN/creat 27/0.8 04/23/2022, not on medications, see below Vascular dementia- MMSE 22/30 2022, BIMS 11/15 (01/12), MRI brain 2022 indicated mild age related cerebral atrophy with chronic small vessel disease, no behaviors, can do most ADLs on her own, remains on Namenda  Chronic back pain- remains on scheduled tylenol and voltaren gel Osteoporosis- no additional workup per goals of care, remains on calcium supplement Allergies- remains on  Flonase  No recent falls or injuries. Ambulates with walker.   Recent blood pressures:  02/14- 132/76  02/07- 137/46  01/31- 138/79  Recent weights:  02/07- 150.8 lbs  01/07- 151.2 lbs  12/06- 149.6 lbs   Past Medical History:  Diagnosis Date   Anxiety    Cataract    Chest pain 02/16/2010   Myoview, negative for pharmacologic-stress induced ischemia, EF 74   Complication of anesthesia 1954   hard to wake up   Diverticulosis    DUB (dysfunctional uterine bleeding)    Dysrhythmia    h/o PVCs- none recently- no meds   Fall    GERD (gastroesophageal reflux disease)    Hearing reduced    Hypertension    no meds currently   Loss of consciousness ([place])    Memory loss    Osteoarthritis of left hip    Osteoporosis    Palpitation 02/02/2013   A CardioNet MCOT (mobile cardiac outpatient telemetry ): NSR with PACs; sinus tachycardia with ventricular trigeminy. Brief atrial run. Facet cardiac; ventricular trigeminy and accelerated idioventricular rhythm. No significant tachyarrhythmias.   Palpitations    Postmenopausal bleeding 11/01/2014   Unsteady gait    Varicose veins of both legs with pain    Past Surgical History:  Procedure Laterality Date   APPENDECTOMY     CATARACT EXTRACTION     both eyes   CESAREAN SECTION  1962, 1963, [id number]   x 4   COLONOSCOPY  01/14/2017   DIAGNOSTIC LAPAROSCOPY     ECTOPIC PREGNANCY SURGERY  1966   FRACTURE SURGERY  HIP SURGERY Right    HYSTEROSCOPY WITH D & C N/A 11/02/2014   Procedure: DILATATION AND CURETTAGE /HYSTEROSCOPY;  Surgeon: Janyth Contes, MD;  Location: [facility];  Service: Gynecology;  Laterality: N/A;   Lower Extremity Venous Dopplers  01/2013   No thrombus or thrombophlebitis. R Greater Saphenous Vein - enlarged without other insufficiency (6 mm); right and left Small Saphenous Vein: Patent no valve insufficiency.   TONSILLECTOMY     TONSILLECTOMY AND ADENOIDECTOMY     TRANSTHORACIC ECHOCARDIOGRAM  01/2013    Normal LV size and function. EF 55-60%. Grade 1 diastolic dysfunction. Elevated LV filling pressures. No regional W. May.    Allergies  Allergen Reactions   Evista [Raloxifene]    Fosamax [Alendronate]    Penicillins    Polyoxyethylene Lauryl Ether [Sorbitan]    Moxifloxacin Rash    Allergies as of 11/04/2022       Reactions   Evista [raloxifene]    Fosamax [alendronate]    Penicillins    Polyoxyethylene Lauryl Ether [sorbitan]    Moxifloxacin Rash        Medication List        Accurate as of November 04, 2022 10:41 AM. If you have any questions, ask your nurse or doctor.          STOP taking these medications    loperamide 2 MG tablet Commonly known as: IMODIUM A-D Stopped by: Yvonna Alanis, NP   loratadine 10 MG tablet Commonly known as: CLARITIN Stopped by: Yvonna Alanis, NP   Robafen DM 10-100 MG/5ML liquid Generic drug: Dextromethorphan-guaiFENesin Stopped by: Yvonna Alanis, NP       TAKE these medications    acetaminophen 500 MG tablet Commonly known as: TYLENOL Take 1,000 mg by mouth 3 (three) times daily.   Tylenol 8 Hour Arthritis Pain 650 MG CR tablet Generic drug: acetaminophen Take 650 mg by mouth 2 (two) times daily.   CALCIUM 600 PO Take 1 tablet by mouth daily.   calcium carbonate 500 MG chewable tablet Commonly known as: TUMS - dosed in mg elemental calcium Chew 1 tablet by mouth 2 (two) times daily as needed for indigestion or heartburn.   CeraVe Lotn Apply 1 application. topically daily.   diclofenac Sodium 1 % Gel Commonly known as: VOLTAREN Apply 1 g topically 3 (three) times daily.   fluticasone 50 MCG/ACT nasal spray Commonly known as: FLONASE Place 2 sprays into both nostrils daily.   memantine 10 MG tablet Commonly known as: NAMENDA Take 10 mg by mouth 2 (two) times daily.   MULTIPLE VITAMIN PO Take by mouth daily.        Review of Systems  Unable to perform ROS: Dementia    Immunization History   Administered Date(s) Administered   Fluad Quad(high Dose 65+) 07/08/2022   Influenza, Quadrivalent, Recombinant, Inj, Pf 06/18/2018, 05/04/2019, 06/15/2020   Influenza-Unspecified 09/15/2019, 07/08/2021   Moderna Sars-Covid-2 Vaccination 09/15/2019   PFIZER(Purple Top)SARS-COV-2 Vaccination 10/03/2019, 10/22/2019, 09/03/2020   Pfizer Covid-19 Vaccine Bivalent Booster 60yr & up 06/04/2021   Pneumococcal Conjugate-13 05/28/2014   Pneumococcal Polysaccharide-23 07/15/1998, 06/15/2005   Tdap 05/26/2016   Unspecified SARS-COV-2 Vaccination 03/03/2022   Zoster, Live 05/18/2013, 05/28/2014   Pertinent  Health Maintenance Due  Topic Date Due   DEXA SCAN  Never done   INFLUENZA VACCINE  Completed      11/03/2018    1:24 PM 10/23/2019    1:44 PM 05/14/2021    1:40 PM 05/22/2022    2:37  PM 07/23/2022    2:47 PM  [place] in the past year?  0 1 0 0  Was there an injury with Fall?   0 0 0  Fall Risk Category Calculator   1 0 0  Fall Risk Category (Retired)   Low Low Low  (RETIRED) Patient Fall Risk Level Low fall risk  Moderate fall risk Low fall risk Low fall risk  Patient at Risk for Falls Due to   History of fall(s);Impaired balance/gait History of fall(s) No Fall Risks  Fall risk Follow up   Falls evaluation completed;Education provided;Falls prevention discussed Falls evaluation completed;Education provided;Falls prevention discussed Falls evaluation completed   Functional Status Survey:    Vitals:   11/04/22 1036  BP: 132/76  Pulse: 70  Resp: 18  Temp: (!) 97 F (36.1 C)  SpO2: 91%  Weight: 150 lb 12.8 oz (68.4 kg)  Height: 5' 5"$  (1.651 m)   Body mass index is 25.09 kg/m. Physical Exam Vitals reviewed.  Constitutional:      General: She is not in acute distress. HENT:     Head: Normocephalic.     Right Ear: There is no impacted cerumen.     Left Ear: There is no impacted cerumen.     Nose: Nose normal.     Mouth/Throat:     Mouth: Mucous membranes are moist.   Eyes:     General:        Right eye: No discharge.        Left eye: No discharge.     Comments: eyeglasses  Cardiovascular:     Rate and Rhythm: Normal rate and regular rhythm.     Pulses: Normal pulses.     Heart sounds: Normal heart sounds.  Pulmonary:     Effort: Pulmonary effort is normal. No respiratory distress.     Breath sounds: Normal breath sounds. No wheezing.  Abdominal:     General: Bowel sounds are normal. There is no distension.     Palpations: Abdomen is soft.     Tenderness: There is no abdominal tenderness.  Musculoskeletal:     Cervical back: Neck supple.     Right lower leg: No edema.     Left lower leg: No edema.  Skin:    General: Skin is warm and dry.     Capillary Refill: Capillary refill takes less than 2 seconds.  Neurological:     General: No focal deficit present.     Mental Status: She is alert. Mental status is at baseline.     Motor: Weakness present.     Gait: Gait abnormal.     Comments: FWW  Psychiatric:        Mood and Affect: Mood normal.     Comments: Very pleasant, follows commands, alert to self/familiar face and place     Labs reviewed: Recent Labs    04/23/22 0000  NA 138  K 4.4  CL 107  CO2 26*  BUN 27*  CREATININE 0.8  CALCIUM 8.9   Recent Labs    04/23/22 0000  AST 15  ALT 10  ALBUMIN 3.3*   Recent Labs    04/23/22 0000  WBC 4.6  NEUTROABS 2,277.00  HGB 11.6*  HCT 35*  PLT 203   Lab Results  Component Value Date   TSH 4.09 10/21/2021   No results found for: "HGBA1C" Lab Results  Component Value Date   CHOL 156 08/27/2020   HDL 69 08/27/2020  LDLCALC 75 08/27/2020   TRIG 62 08/27/2020   CHOLHDL 2.8 02/15/2010    Significant Diagnostic Results in last 30 days:  No results found.  Assessment/Plan 1. Essential hypertension - labile - controlled without medication  2. Vascular dementia without behavioral disturbance [facility]) - BIMS 11/15 09/2022 - no behaviors - can complete most ADLs on  own - ambulates well with walker - cont Namenda  3. Chronic midline thoracic back pain - cont scheduled tylenol and voltaren gel  4. Age-related osteoporosis without current pathological fracture - no further work up per goals of care - cont calcium supplement  5. Non-seasonal allergic rhinitis due to other allergic trigger - cont Flonase    Family/ staff Communication: plan discussed with patient and nurse  Labs/tests ordered: cbc/diff, cmp, TSH

## 2022-11-05 DIAGNOSIS — D649 Anemia, unspecified: Secondary | ICD-10-CM | POA: Diagnosis not present

## 2022-11-05 LAB — HEPATIC FUNCTION PANEL
ALT: 14 U/L (ref 7–35)
AST: 13 (ref 13–35)
Alkaline Phosphatase: 98 (ref 25–125)

## 2022-11-05 LAB — BASIC METABOLIC PANEL
BUN: 27 — AB (ref 4–21)
CO2: 25 — AB (ref 13–22)
Chloride: 109 — AB (ref 99–108)
Creatinine: 0.9 (ref 0.5–1.1)
Glucose: 77
Potassium: 4.3 mEq/L (ref 3.5–5.1)
Sodium: 138 (ref 137–147)

## 2022-11-05 LAB — CBC AND DIFFERENTIAL
HCT: 33 — AB (ref 36–46)
Hemoglobin: 11.2 — AB (ref 12.0–16.0)
Neutrophils Absolute: 2615
Platelets: 243 10*3/uL (ref 150–400)
WBC: 5

## 2022-11-05 LAB — COMPREHENSIVE METABOLIC PANEL
Albumin: 3.3 — AB (ref 3.5–5.0)
Calcium: 9 (ref 8.7–10.7)
Globulin: 2.2

## 2022-11-05 LAB — CBC: RBC: 3.75 — AB (ref 3.87–5.11)

## 2022-11-06 DIAGNOSIS — R2681 Unsteadiness on feet: Secondary | ICD-10-CM | POA: Diagnosis not present

## 2022-11-10 DIAGNOSIS — R2681 Unsteadiness on feet: Secondary | ICD-10-CM | POA: Diagnosis not present

## 2022-11-11 ENCOUNTER — Non-Acute Institutional Stay: Payer: Medicare Other | Admitting: Orthopedic Surgery

## 2022-11-11 ENCOUNTER — Encounter: Payer: Self-pay | Admitting: Orthopedic Surgery

## 2022-11-11 DIAGNOSIS — F0150 Vascular dementia without behavioral disturbance: Secondary | ICD-10-CM | POA: Diagnosis not present

## 2022-11-11 DIAGNOSIS — M546 Pain in thoracic spine: Secondary | ICD-10-CM

## 2022-11-11 DIAGNOSIS — R2681 Unsteadiness on feet: Secondary | ICD-10-CM | POA: Diagnosis not present

## 2022-11-11 DIAGNOSIS — G8929 Other chronic pain: Secondary | ICD-10-CM

## 2022-11-11 MED ORDER — PREDNISONE 20 MG PO TABS: 20.0000 mg | ORAL_TABLET | Freq: Every day | ORAL | 0 refills | Status: AC

## 2022-11-11 NOTE — Progress Notes (Signed)
Location:   [place] Room Number: [place] of Service:  ALF [phone]) Provider:  Windell Moulding, NP  PCP: Virgie Dad, MD  Patient Care Team: Virgie Dad, MD as PCP - General (Internal Medicine) Pa, [facility] (Ophthalmology) Dermatology, [facility] (Dermatology)  Extended Emergency Contact Information Primary Emergency Contact: Arelia Sneddon States of [place] Phone: [phone] Mobile Phone: [phone] Relation: Daughter  Code Status:  DNR Goals of care: Advanced Directive information    11/11/2022   11:26 AM  Advanced Directives  Does Patient Have a Medical Advance Directive? Yes  Type of Paramedic of [place];Living will;Out of facility DNR (pink MOST or yellow form)  Does patient want to make changes to medical advance directive? No - Patient declined  Copy of [place] in Chart? Yes - validated most recent copy scanned in chart (See row information)     Chief Complaint  Patient presents with   Acute Visit    Lower Back Pain    HPI:  Pt is a 89 y.o. female seen today for an acute visit for due to lowe back pain.   She continues to reside in assisted living at [facility]. PMH: HTN, HLD, venous insufficiency, GERD, dementia, right hip surgery, and diverticulitis.   Increased low back pain began 11/08/2022. She was prescribed lidocaine 4% patches. She is also given scheduled tylenol and voltaren gel. Poor historian due to dementia. She touches to her mid lower back when asked " where does it hurt." She denies groin pain, radiation of pain or loss of B&B. She is able to ambulate with her walker without difficulty. She is currently working with PT. No recent falls or injuries.      Past Medical History:  Diagnosis Date   Anxiety    Cataract    Chest pain 02/16/2010   Myoview, negative for pharmacologic-stress induced ischemia, EF 74   Complication of anesthesia 1954    hard to wake up   Diverticulosis    DUB (dysfunctional uterine bleeding)    Dysrhythmia    h/o PVCs- none recently- no meds   Fall    GERD (gastroesophageal reflux disease)    Hearing reduced    Hypertension    no meds currently   Loss of consciousness ([place])    Memory loss    Osteoarthritis of left hip    Osteoporosis    Palpitation 02/02/2013   A CardioNet MCOT (mobile cardiac outpatient telemetry ): NSR with PACs; sinus tachycardia with ventricular trigeminy. Brief atrial run. Facet cardiac; ventricular trigeminy and accelerated idioventricular rhythm. No significant tachyarrhythmias.   Palpitations    Postmenopausal bleeding 11/01/2014   Unsteady gait    Varicose veins of both legs with pain    Past Surgical History:  Procedure Laterality Date   APPENDECTOMY     CATARACT EXTRACTION     both eyes   CESAREAN SECTION  1962, 1963, [id number]   x 4   COLONOSCOPY  01/14/2017   DIAGNOSTIC LAPAROSCOPY     ECTOPIC PREGNANCY SURGERY  1966   FRACTURE SURGERY     HIP SURGERY Right    HYSTEROSCOPY WITH D & C N/A 11/02/2014   Procedure: DILATATION AND CURETTAGE /HYSTEROSCOPY;  Surgeon: Janyth Contes, MD;  Location: [facility];  Service: Gynecology;  Laterality: N/A;   Lower Extremity Venous Dopplers  01/2013   No thrombus or thrombophlebitis. R Greater Saphenous Vein - enlarged without other insufficiency (6 mm);  right and left Small Saphenous Vein: Patent no valve insufficiency.   TONSILLECTOMY     TONSILLECTOMY AND ADENOIDECTOMY     TRANSTHORACIC ECHOCARDIOGRAM  01/2013   Normal LV size and function. EF 55-60%. Grade 1 diastolic dysfunction. Elevated LV filling pressures. No regional W. May.    Allergies  Allergen Reactions   Evista [Raloxifene]    Fosamax [Alendronate]    Penicillins    Polyoxyethylene Lauryl Ether [Sorbitan]    Moxifloxacin Rash    Allergies as of 11/11/2022       Reactions   Evista [raloxifene]    Fosamax [alendronate]    Penicillins     Polyoxyethylene Lauryl Ether [sorbitan]    Moxifloxacin Rash        Medication List        Accurate as of November 11, 2022 11:26 AM. If you have any questions, ask your nurse or doctor.          acetaminophen 500 MG tablet Commonly known as: TYLENOL Take 1,000 mg by mouth 3 (three) times daily.   Tylenol 8 Hour Arthritis Pain 650 MG CR tablet Generic drug: acetaminophen Take 650 mg by mouth 2 (two) times daily.   CALCIUM 600 PO Take 1 tablet by mouth daily.   calcium carbonate 500 MG chewable tablet Commonly known as: TUMS - dosed in mg elemental calcium Chew 1 tablet by mouth 2 (two) times daily as needed for indigestion or heartburn.   CeraVe Lotn Apply 1 application. topically daily.   diclofenac Sodium 1 % Gel Commonly known as: VOLTAREN Apply 1 g topically 3 (three) times daily.   fluticasone 50 MCG/ACT nasal spray Commonly known as: FLONASE Place 2 sprays into both nostrils daily.   lidocaine 4 % Place 1 patch onto the skin daily.   memantine 10 MG tablet Commonly known as: NAMENDA Take 10 mg by mouth 2 (two) times daily.   MULTIPLE VITAMIN PO Take by mouth daily.        Review of Systems  Unable to perform ROS: Dementia    Immunization History  Administered Date(s) Administered   Fluad Quad(high Dose 65+) 07/08/2022   Influenza, Quadrivalent, Recombinant, Inj, Pf 06/18/2018, 05/04/2019, 06/15/2020   Influenza-Unspecified 09/15/2019, 07/08/2021   Moderna Sars-Covid-2 Vaccination 09/15/2019   PFIZER(Purple Top)SARS-COV-2 Vaccination 10/03/2019, 10/22/2019, 09/03/2020   Pfizer Covid-19 Vaccine Bivalent Booster 56yr & up 06/04/2021   Pneumococcal Conjugate-13 05/28/2014   Pneumococcal Polysaccharide-23 07/15/1998, 06/15/2005   Tdap 05/26/2016   Unspecified SARS-COV-2 Vaccination 03/03/2022   Zoster, Live 05/18/2013, 05/28/2014   Pertinent  Health Maintenance Due  Topic Date Due   DEXA SCAN  11/05/2023 (Originally 03/06/1998)   INFLUENZA  VACCINE  Completed      11/03/2018    1:24 PM 10/23/2019    1:44 PM 05/14/2021    1:40 PM 05/22/2022    2:37 PM 07/23/2022    2:47 PM  [place]?  0 1 0 0  Was there an injury with Fall?   0 0 0  Fall Risk Category Calculator   1 0 0  Fall Risk Category (Retired)   Low Low Low  (RETIRED) Patient Fall Risk Level Low fall risk  Moderate fall risk Low fall risk Low fall risk  Patient at Risk for Falls Due to   History of fall(s);Impaired balance/gait History of fall(s) No Fall Risks  Fall risk Follow up   Falls evaluation completed;Education provided;Falls prevention discussed Falls evaluation completed;Education provided;Falls prevention discussed Falls evaluation  completed   Functional Status Survey:    Vitals:   11/11/22 1119  BP: 124/70  Pulse: 76  Resp: 16  Temp: (!) 97.5 F (36.4 C)  SpO2: 99%  Weight: 150 lb 12.8 oz (68.4 kg)  Height: '5\' 5"'$  (1.651 m)   Body mass index is 25.09 kg/m. Physical Exam Vitals reviewed.  Constitutional:      General: She is not in acute distress. HENT:     Head: Normocephalic.  Eyes:     General:        Right eye: No discharge.        Left eye: No discharge.  Cardiovascular:     Rate and Rhythm: Normal rate and regular rhythm.     Pulses: Normal pulses.     Heart sounds: Normal heart sounds.  Pulmonary:     Effort: Pulmonary effort is normal. No respiratory distress.     Breath sounds: Normal breath sounds. No wheezing.  Abdominal:     General: There is no distension.     Palpations: Abdomen is soft.     Tenderness: There is no abdominal tenderness. There is no right CVA tenderness or left CVA tenderness.  Musculoskeletal:     Cervical back: Normal and neck supple.     Thoracic back: Normal.     Lumbar back: Normal.     Right hip: No deformity or tenderness. Normal range of motion. Normal strength.     Left hip: No deformity or tenderness. Normal range of motion. Normal strength.     Right lower leg: No  edema.     Left lower leg: No edema.     Comments: Normal straight leg raise, WBAT  Skin:    General: Skin is warm.     Capillary Refill: Capillary refill takes less than 2 seconds.  Neurological:     General: No focal deficit present.     Mental Status: She is alert. Mental status is at baseline.     Motor: Weakness present.     Gait: Gait abnormal.     Comments: walker  Psychiatric:        Mood and Affect: Mood normal.        Behavior: Behavior normal.     Labs reviewed: Recent Labs    04/23/22 0000 11/05/22 0000  NA 138 138  K 4.4 4.3  CL 107 109*  CO2 26* 25*  BUN 27* 27*  CREATININE 0.8 0.9  CALCIUM 8.9 9.0   Recent Labs    04/23/22 0000 11/05/22 0000  AST 15 13  ALT 10 14  ALKPHOS  --  98  ALBUMIN 3.3* 3.3*   Recent Labs    04/23/22 0000 11/05/22 0000  WBC 4.6 5.0  NEUTROABS 2,277.00 2,615.00  HGB 11.6* 11.2*  HCT 35* 33*  PLT 203 243   Lab Results  Component Value Date   TSH 4.09 10/21/2021   No results found for: "HGBA1C" Lab Results  Component Value Date   CHOL 156 08/27/2020   HDL 69 08/27/2020   LDLCALC 75 08/27/2020   TRIG 62 08/27/2020   CHOLHDL 2.8 02/15/2010    Significant Diagnostic Results in last 30 days:  No results found.  Assessment/Plan 1. Chronic midline thoracic back pain - increased mid lower back pain 02/25 - 02/25 lidocaine 4 % patches started> appears it has not improved pain per nursing - exam unremarkable - recently started working with PT - start prednisone 20 mg po QAM x 7 days - cont  tylenol and voltaren gel - consider xray spine if no improvement  2. Vascular dementia without behavioral disturbance ([place]) - no behaviors - doing well in AL - cont Namenda    Family/ staff Communication: plan discussed with patient and nurse  Labs/tests ordered:  none

## 2022-11-13 DIAGNOSIS — R2681 Unsteadiness on feet: Secondary | ICD-10-CM | POA: Diagnosis not present

## 2022-11-16 DIAGNOSIS — R2681 Unsteadiness on feet: Secondary | ICD-10-CM | POA: Diagnosis not present

## 2022-11-16 DIAGNOSIS — H9190 Unspecified hearing loss, unspecified ear: Secondary | ICD-10-CM | POA: Diagnosis not present

## 2022-11-16 DIAGNOSIS — G309 Alzheimer's disease, unspecified: Secondary | ICD-10-CM | POA: Diagnosis not present

## 2022-11-16 DIAGNOSIS — R41841 Cognitive communication deficit: Secondary | ICD-10-CM | POA: Diagnosis not present

## 2022-11-18 DIAGNOSIS — R2681 Unsteadiness on feet: Secondary | ICD-10-CM | POA: Diagnosis not present

## 2022-11-19 DIAGNOSIS — H9190 Unspecified hearing loss, unspecified ear: Secondary | ICD-10-CM | POA: Diagnosis not present

## 2022-11-19 DIAGNOSIS — R41841 Cognitive communication deficit: Secondary | ICD-10-CM | POA: Diagnosis not present

## 2022-11-19 DIAGNOSIS — R2681 Unsteadiness on feet: Secondary | ICD-10-CM | POA: Diagnosis not present

## 2022-11-19 DIAGNOSIS — G309 Alzheimer's disease, unspecified: Secondary | ICD-10-CM | POA: Diagnosis not present

## 2022-11-20 DIAGNOSIS — R2681 Unsteadiness on feet: Secondary | ICD-10-CM | POA: Diagnosis not present

## 2022-11-20 DIAGNOSIS — G309 Alzheimer's disease, unspecified: Secondary | ICD-10-CM | POA: Diagnosis not present

## 2022-11-20 DIAGNOSIS — R41841 Cognitive communication deficit: Secondary | ICD-10-CM | POA: Diagnosis not present

## 2022-11-20 DIAGNOSIS — H9190 Unspecified hearing loss, unspecified ear: Secondary | ICD-10-CM | POA: Diagnosis not present

## 2022-11-23 DIAGNOSIS — G309 Alzheimer's disease, unspecified: Secondary | ICD-10-CM | POA: Diagnosis not present

## 2022-11-23 DIAGNOSIS — R41841 Cognitive communication deficit: Secondary | ICD-10-CM | POA: Diagnosis not present

## 2022-11-23 DIAGNOSIS — H9190 Unspecified hearing loss, unspecified ear: Secondary | ICD-10-CM | POA: Diagnosis not present

## 2022-11-23 DIAGNOSIS — R2681 Unsteadiness on feet: Secondary | ICD-10-CM | POA: Diagnosis not present

## 2022-11-25 DIAGNOSIS — R2681 Unsteadiness on feet: Secondary | ICD-10-CM | POA: Diagnosis not present

## 2022-11-25 DIAGNOSIS — G309 Alzheimer's disease, unspecified: Secondary | ICD-10-CM | POA: Diagnosis not present

## 2022-11-25 DIAGNOSIS — R41841 Cognitive communication deficit: Secondary | ICD-10-CM | POA: Diagnosis not present

## 2022-11-25 DIAGNOSIS — H9190 Unspecified hearing loss, unspecified ear: Secondary | ICD-10-CM | POA: Diagnosis not present

## 2022-11-26 DIAGNOSIS — R2681 Unsteadiness on feet: Secondary | ICD-10-CM | POA: Diagnosis not present

## 2022-11-27 DIAGNOSIS — G309 Alzheimer's disease, unspecified: Secondary | ICD-10-CM | POA: Diagnosis not present

## 2022-11-27 DIAGNOSIS — R41841 Cognitive communication deficit: Secondary | ICD-10-CM | POA: Diagnosis not present

## 2022-11-27 DIAGNOSIS — R2681 Unsteadiness on feet: Secondary | ICD-10-CM | POA: Diagnosis not present

## 2022-11-27 DIAGNOSIS — H9190 Unspecified hearing loss, unspecified ear: Secondary | ICD-10-CM | POA: Diagnosis not present

## 2022-11-30 DIAGNOSIS — G309 Alzheimer's disease, unspecified: Secondary | ICD-10-CM | POA: Diagnosis not present

## 2022-11-30 DIAGNOSIS — R2681 Unsteadiness on feet: Secondary | ICD-10-CM | POA: Diagnosis not present

## 2022-11-30 DIAGNOSIS — R41841 Cognitive communication deficit: Secondary | ICD-10-CM | POA: Diagnosis not present

## 2022-11-30 DIAGNOSIS — H9190 Unspecified hearing loss, unspecified ear: Secondary | ICD-10-CM | POA: Diagnosis not present

## 2022-12-02 DIAGNOSIS — R2681 Unsteadiness on feet: Secondary | ICD-10-CM | POA: Diagnosis not present

## 2022-12-02 DIAGNOSIS — R41841 Cognitive communication deficit: Secondary | ICD-10-CM | POA: Diagnosis not present

## 2022-12-02 DIAGNOSIS — G309 Alzheimer's disease, unspecified: Secondary | ICD-10-CM | POA: Diagnosis not present

## 2022-12-02 DIAGNOSIS — H9190 Unspecified hearing loss, unspecified ear: Secondary | ICD-10-CM | POA: Diagnosis not present

## 2022-12-04 DIAGNOSIS — G309 Alzheimer's disease, unspecified: Secondary | ICD-10-CM | POA: Diagnosis not present

## 2022-12-04 DIAGNOSIS — H9190 Unspecified hearing loss, unspecified ear: Secondary | ICD-10-CM | POA: Diagnosis not present

## 2022-12-04 DIAGNOSIS — R2681 Unsteadiness on feet: Secondary | ICD-10-CM | POA: Diagnosis not present

## 2022-12-04 DIAGNOSIS — R41841 Cognitive communication deficit: Secondary | ICD-10-CM | POA: Diagnosis not present

## 2022-12-07 DIAGNOSIS — H9190 Unspecified hearing loss, unspecified ear: Secondary | ICD-10-CM | POA: Diagnosis not present

## 2022-12-07 DIAGNOSIS — R2681 Unsteadiness on feet: Secondary | ICD-10-CM | POA: Diagnosis not present

## 2022-12-07 DIAGNOSIS — G309 Alzheimer's disease, unspecified: Secondary | ICD-10-CM | POA: Diagnosis not present

## 2022-12-07 DIAGNOSIS — R41841 Cognitive communication deficit: Secondary | ICD-10-CM | POA: Diagnosis not present

## 2022-12-09 DIAGNOSIS — H9190 Unspecified hearing loss, unspecified ear: Secondary | ICD-10-CM | POA: Diagnosis not present

## 2022-12-09 DIAGNOSIS — R41841 Cognitive communication deficit: Secondary | ICD-10-CM | POA: Diagnosis not present

## 2022-12-09 DIAGNOSIS — R2681 Unsteadiness on feet: Secondary | ICD-10-CM | POA: Diagnosis not present

## 2022-12-09 DIAGNOSIS — G309 Alzheimer's disease, unspecified: Secondary | ICD-10-CM | POA: Diagnosis not present

## 2022-12-10 DIAGNOSIS — R2681 Unsteadiness on feet: Secondary | ICD-10-CM | POA: Diagnosis not present

## 2022-12-10 DIAGNOSIS — R41841 Cognitive communication deficit: Secondary | ICD-10-CM | POA: Diagnosis not present

## 2022-12-10 DIAGNOSIS — H9190 Unspecified hearing loss, unspecified ear: Secondary | ICD-10-CM | POA: Diagnosis not present

## 2022-12-10 DIAGNOSIS — G309 Alzheimer's disease, unspecified: Secondary | ICD-10-CM | POA: Diagnosis not present

## 2022-12-11 DIAGNOSIS — R2681 Unsteadiness on feet: Secondary | ICD-10-CM | POA: Diagnosis not present

## 2022-12-14 DIAGNOSIS — M6281 Muscle weakness (generalized): Secondary | ICD-10-CM | POA: Diagnosis not present

## 2022-12-14 DIAGNOSIS — R278 Other lack of coordination: Secondary | ICD-10-CM | POA: Diagnosis not present

## 2022-12-14 DIAGNOSIS — R41841 Cognitive communication deficit: Secondary | ICD-10-CM | POA: Diagnosis not present

## 2022-12-14 DIAGNOSIS — R2681 Unsteadiness on feet: Secondary | ICD-10-CM | POA: Diagnosis not present

## 2022-12-14 DIAGNOSIS — G309 Alzheimer's disease, unspecified: Secondary | ICD-10-CM | POA: Diagnosis not present

## 2022-12-14 DIAGNOSIS — H9190 Unspecified hearing loss, unspecified ear: Secondary | ICD-10-CM | POA: Diagnosis not present

## 2022-12-15 DIAGNOSIS — R2681 Unsteadiness on feet: Secondary | ICD-10-CM | POA: Diagnosis not present

## 2022-12-16 DIAGNOSIS — R2681 Unsteadiness on feet: Secondary | ICD-10-CM | POA: Diagnosis not present

## 2022-12-16 DIAGNOSIS — R41841 Cognitive communication deficit: Secondary | ICD-10-CM | POA: Diagnosis not present

## 2022-12-16 DIAGNOSIS — H9190 Unspecified hearing loss, unspecified ear: Secondary | ICD-10-CM | POA: Diagnosis not present

## 2022-12-16 DIAGNOSIS — G309 Alzheimer's disease, unspecified: Secondary | ICD-10-CM | POA: Diagnosis not present

## 2022-12-16 DIAGNOSIS — M6281 Muscle weakness (generalized): Secondary | ICD-10-CM | POA: Diagnosis not present

## 2022-12-16 DIAGNOSIS — R278 Other lack of coordination: Secondary | ICD-10-CM | POA: Diagnosis not present

## 2022-12-17 DIAGNOSIS — G309 Alzheimer's disease, unspecified: Secondary | ICD-10-CM | POA: Diagnosis not present

## 2022-12-17 DIAGNOSIS — M6281 Muscle weakness (generalized): Secondary | ICD-10-CM | POA: Diagnosis not present

## 2022-12-17 DIAGNOSIS — R278 Other lack of coordination: Secondary | ICD-10-CM | POA: Diagnosis not present

## 2022-12-17 DIAGNOSIS — H9190 Unspecified hearing loss, unspecified ear: Secondary | ICD-10-CM | POA: Diagnosis not present

## 2022-12-17 DIAGNOSIS — R2681 Unsteadiness on feet: Secondary | ICD-10-CM | POA: Diagnosis not present

## 2022-12-17 DIAGNOSIS — R41841 Cognitive communication deficit: Secondary | ICD-10-CM | POA: Diagnosis not present

## 2022-12-18 DIAGNOSIS — R41841 Cognitive communication deficit: Secondary | ICD-10-CM | POA: Diagnosis not present

## 2022-12-18 DIAGNOSIS — R278 Other lack of coordination: Secondary | ICD-10-CM | POA: Diagnosis not present

## 2022-12-18 DIAGNOSIS — R2681 Unsteadiness on feet: Secondary | ICD-10-CM | POA: Diagnosis not present

## 2022-12-18 DIAGNOSIS — H9190 Unspecified hearing loss, unspecified ear: Secondary | ICD-10-CM | POA: Diagnosis not present

## 2022-12-18 DIAGNOSIS — G309 Alzheimer's disease, unspecified: Secondary | ICD-10-CM | POA: Diagnosis not present

## 2022-12-18 DIAGNOSIS — M6281 Muscle weakness (generalized): Secondary | ICD-10-CM | POA: Diagnosis not present

## 2022-12-21 DIAGNOSIS — R41841 Cognitive communication deficit: Secondary | ICD-10-CM | POA: Diagnosis not present

## 2022-12-21 DIAGNOSIS — R2681 Unsteadiness on feet: Secondary | ICD-10-CM | POA: Diagnosis not present

## 2022-12-21 DIAGNOSIS — H9190 Unspecified hearing loss, unspecified ear: Secondary | ICD-10-CM | POA: Diagnosis not present

## 2022-12-21 DIAGNOSIS — G309 Alzheimer's disease, unspecified: Secondary | ICD-10-CM | POA: Diagnosis not present

## 2022-12-21 DIAGNOSIS — R278 Other lack of coordination: Secondary | ICD-10-CM | POA: Diagnosis not present

## 2022-12-21 DIAGNOSIS — M6281 Muscle weakness (generalized): Secondary | ICD-10-CM | POA: Diagnosis not present

## 2022-12-22 DIAGNOSIS — R278 Other lack of coordination: Secondary | ICD-10-CM | POA: Diagnosis not present

## 2022-12-22 DIAGNOSIS — H9190 Unspecified hearing loss, unspecified ear: Secondary | ICD-10-CM | POA: Diagnosis not present

## 2022-12-22 DIAGNOSIS — R2681 Unsteadiness on feet: Secondary | ICD-10-CM | POA: Diagnosis not present

## 2022-12-22 DIAGNOSIS — R41841 Cognitive communication deficit: Secondary | ICD-10-CM | POA: Diagnosis not present

## 2022-12-22 DIAGNOSIS — M6281 Muscle weakness (generalized): Secondary | ICD-10-CM | POA: Diagnosis not present

## 2022-12-22 DIAGNOSIS — G309 Alzheimer's disease, unspecified: Secondary | ICD-10-CM | POA: Diagnosis not present

## 2022-12-23 DIAGNOSIS — G309 Alzheimer's disease, unspecified: Secondary | ICD-10-CM | POA: Diagnosis not present

## 2022-12-23 DIAGNOSIS — M6281 Muscle weakness (generalized): Secondary | ICD-10-CM | POA: Diagnosis not present

## 2022-12-23 DIAGNOSIS — R41841 Cognitive communication deficit: Secondary | ICD-10-CM | POA: Diagnosis not present

## 2022-12-23 DIAGNOSIS — R278 Other lack of coordination: Secondary | ICD-10-CM | POA: Diagnosis not present

## 2022-12-23 DIAGNOSIS — H9190 Unspecified hearing loss, unspecified ear: Secondary | ICD-10-CM | POA: Diagnosis not present

## 2022-12-23 DIAGNOSIS — R2681 Unsteadiness on feet: Secondary | ICD-10-CM | POA: Diagnosis not present

## 2022-12-24 DIAGNOSIS — G309 Alzheimer's disease, unspecified: Secondary | ICD-10-CM | POA: Diagnosis not present

## 2022-12-24 DIAGNOSIS — R41841 Cognitive communication deficit: Secondary | ICD-10-CM | POA: Diagnosis not present

## 2022-12-24 DIAGNOSIS — M6281 Muscle weakness (generalized): Secondary | ICD-10-CM | POA: Diagnosis not present

## 2022-12-24 DIAGNOSIS — R278 Other lack of coordination: Secondary | ICD-10-CM | POA: Diagnosis not present

## 2022-12-24 DIAGNOSIS — H9190 Unspecified hearing loss, unspecified ear: Secondary | ICD-10-CM | POA: Diagnosis not present

## 2022-12-24 DIAGNOSIS — R2681 Unsteadiness on feet: Secondary | ICD-10-CM | POA: Diagnosis not present

## 2022-12-28 DIAGNOSIS — R2681 Unsteadiness on feet: Secondary | ICD-10-CM | POA: Diagnosis not present

## 2022-12-29 DIAGNOSIS — M6281 Muscle weakness (generalized): Secondary | ICD-10-CM | POA: Diagnosis not present

## 2022-12-29 DIAGNOSIS — R278 Other lack of coordination: Secondary | ICD-10-CM | POA: Diagnosis not present

## 2022-12-29 DIAGNOSIS — G309 Alzheimer's disease, unspecified: Secondary | ICD-10-CM | POA: Diagnosis not present

## 2022-12-29 DIAGNOSIS — H9190 Unspecified hearing loss, unspecified ear: Secondary | ICD-10-CM | POA: Diagnosis not present

## 2022-12-29 DIAGNOSIS — R2681 Unsteadiness on feet: Secondary | ICD-10-CM | POA: Diagnosis not present

## 2022-12-29 DIAGNOSIS — R41841 Cognitive communication deficit: Secondary | ICD-10-CM | POA: Diagnosis not present

## 2022-12-30 DIAGNOSIS — G309 Alzheimer's disease, unspecified: Secondary | ICD-10-CM | POA: Diagnosis not present

## 2022-12-30 DIAGNOSIS — H9190 Unspecified hearing loss, unspecified ear: Secondary | ICD-10-CM | POA: Diagnosis not present

## 2022-12-30 DIAGNOSIS — R2681 Unsteadiness on feet: Secondary | ICD-10-CM | POA: Diagnosis not present

## 2022-12-30 DIAGNOSIS — R278 Other lack of coordination: Secondary | ICD-10-CM | POA: Diagnosis not present

## 2022-12-30 DIAGNOSIS — M6281 Muscle weakness (generalized): Secondary | ICD-10-CM | POA: Diagnosis not present

## 2022-12-30 DIAGNOSIS — R41841 Cognitive communication deficit: Secondary | ICD-10-CM | POA: Diagnosis not present

## 2022-12-31 DIAGNOSIS — G309 Alzheimer's disease, unspecified: Secondary | ICD-10-CM | POA: Diagnosis not present

## 2022-12-31 DIAGNOSIS — H9190 Unspecified hearing loss, unspecified ear: Secondary | ICD-10-CM | POA: Diagnosis not present

## 2022-12-31 DIAGNOSIS — R2681 Unsteadiness on feet: Secondary | ICD-10-CM | POA: Diagnosis not present

## 2022-12-31 DIAGNOSIS — R41841 Cognitive communication deficit: Secondary | ICD-10-CM | POA: Diagnosis not present

## 2022-12-31 DIAGNOSIS — R278 Other lack of coordination: Secondary | ICD-10-CM | POA: Diagnosis not present

## 2022-12-31 DIAGNOSIS — M6281 Muscle weakness (generalized): Secondary | ICD-10-CM | POA: Diagnosis not present

## 2023-01-01 DIAGNOSIS — R2681 Unsteadiness on feet: Secondary | ICD-10-CM | POA: Diagnosis not present

## 2023-01-04 DIAGNOSIS — R2681 Unsteadiness on feet: Secondary | ICD-10-CM | POA: Diagnosis not present

## 2023-01-04 DIAGNOSIS — G309 Alzheimer's disease, unspecified: Secondary | ICD-10-CM | POA: Diagnosis not present

## 2023-01-04 DIAGNOSIS — R41841 Cognitive communication deficit: Secondary | ICD-10-CM | POA: Diagnosis not present

## 2023-01-04 DIAGNOSIS — M6281 Muscle weakness (generalized): Secondary | ICD-10-CM | POA: Diagnosis not present

## 2023-01-04 DIAGNOSIS — R278 Other lack of coordination: Secondary | ICD-10-CM | POA: Diagnosis not present

## 2023-01-04 DIAGNOSIS — H9190 Unspecified hearing loss, unspecified ear: Secondary | ICD-10-CM | POA: Diagnosis not present

## 2023-01-05 DIAGNOSIS — R278 Other lack of coordination: Secondary | ICD-10-CM | POA: Diagnosis not present

## 2023-01-05 DIAGNOSIS — M6281 Muscle weakness (generalized): Secondary | ICD-10-CM | POA: Diagnosis not present

## 2023-01-05 DIAGNOSIS — R2681 Unsteadiness on feet: Secondary | ICD-10-CM | POA: Diagnosis not present

## 2023-01-05 DIAGNOSIS — H9190 Unspecified hearing loss, unspecified ear: Secondary | ICD-10-CM | POA: Diagnosis not present

## 2023-01-05 DIAGNOSIS — R41841 Cognitive communication deficit: Secondary | ICD-10-CM | POA: Diagnosis not present

## 2023-01-05 DIAGNOSIS — G309 Alzheimer's disease, unspecified: Secondary | ICD-10-CM | POA: Diagnosis not present

## 2023-01-06 DIAGNOSIS — H9190 Unspecified hearing loss, unspecified ear: Secondary | ICD-10-CM | POA: Diagnosis not present

## 2023-01-06 DIAGNOSIS — R278 Other lack of coordination: Secondary | ICD-10-CM | POA: Diagnosis not present

## 2023-01-06 DIAGNOSIS — R2681 Unsteadiness on feet: Secondary | ICD-10-CM | POA: Diagnosis not present

## 2023-01-06 DIAGNOSIS — R41841 Cognitive communication deficit: Secondary | ICD-10-CM | POA: Diagnosis not present

## 2023-01-06 DIAGNOSIS — M6281 Muscle weakness (generalized): Secondary | ICD-10-CM | POA: Diagnosis not present

## 2023-01-06 DIAGNOSIS — G309 Alzheimer's disease, unspecified: Secondary | ICD-10-CM | POA: Diagnosis not present

## 2023-01-07 DIAGNOSIS — H9190 Unspecified hearing loss, unspecified ear: Secondary | ICD-10-CM | POA: Diagnosis not present

## 2023-01-07 DIAGNOSIS — R278 Other lack of coordination: Secondary | ICD-10-CM | POA: Diagnosis not present

## 2023-01-07 DIAGNOSIS — G309 Alzheimer's disease, unspecified: Secondary | ICD-10-CM | POA: Diagnosis not present

## 2023-01-07 DIAGNOSIS — M6281 Muscle weakness (generalized): Secondary | ICD-10-CM | POA: Diagnosis not present

## 2023-01-07 DIAGNOSIS — R41841 Cognitive communication deficit: Secondary | ICD-10-CM | POA: Diagnosis not present

## 2023-01-07 DIAGNOSIS — R2681 Unsteadiness on feet: Secondary | ICD-10-CM | POA: Diagnosis not present

## 2023-01-11 DIAGNOSIS — R278 Other lack of coordination: Secondary | ICD-10-CM | POA: Diagnosis not present

## 2023-01-11 DIAGNOSIS — M6281 Muscle weakness (generalized): Secondary | ICD-10-CM | POA: Diagnosis not present

## 2023-01-11 DIAGNOSIS — G309 Alzheimer's disease, unspecified: Secondary | ICD-10-CM | POA: Diagnosis not present

## 2023-01-11 DIAGNOSIS — R2681 Unsteadiness on feet: Secondary | ICD-10-CM | POA: Diagnosis not present

## 2023-01-11 DIAGNOSIS — R41841 Cognitive communication deficit: Secondary | ICD-10-CM | POA: Diagnosis not present

## 2023-01-11 DIAGNOSIS — H9190 Unspecified hearing loss, unspecified ear: Secondary | ICD-10-CM | POA: Diagnosis not present

## 2023-01-12 DIAGNOSIS — M6281 Muscle weakness (generalized): Secondary | ICD-10-CM | POA: Diagnosis not present

## 2023-01-12 DIAGNOSIS — R278 Other lack of coordination: Secondary | ICD-10-CM | POA: Diagnosis not present

## 2023-01-12 DIAGNOSIS — H9190 Unspecified hearing loss, unspecified ear: Secondary | ICD-10-CM | POA: Diagnosis not present

## 2023-01-12 DIAGNOSIS — R41841 Cognitive communication deficit: Secondary | ICD-10-CM | POA: Diagnosis not present

## 2023-01-12 DIAGNOSIS — G309 Alzheimer's disease, unspecified: Secondary | ICD-10-CM | POA: Diagnosis not present

## 2023-01-12 DIAGNOSIS — R2681 Unsteadiness on feet: Secondary | ICD-10-CM | POA: Diagnosis not present

## 2023-01-13 DIAGNOSIS — R41841 Cognitive communication deficit: Secondary | ICD-10-CM | POA: Diagnosis not present

## 2023-01-13 DIAGNOSIS — R2681 Unsteadiness on feet: Secondary | ICD-10-CM | POA: Diagnosis not present

## 2023-01-13 DIAGNOSIS — H9190 Unspecified hearing loss, unspecified ear: Secondary | ICD-10-CM | POA: Diagnosis not present

## 2023-01-13 DIAGNOSIS — G309 Alzheimer's disease, unspecified: Secondary | ICD-10-CM | POA: Diagnosis not present

## 2023-01-13 DIAGNOSIS — M6281 Muscle weakness (generalized): Secondary | ICD-10-CM | POA: Diagnosis not present

## 2023-01-13 DIAGNOSIS — R278 Other lack of coordination: Secondary | ICD-10-CM | POA: Diagnosis not present

## 2023-01-14 ENCOUNTER — Encounter: Payer: Self-pay | Admitting: Internal Medicine

## 2023-01-14 ENCOUNTER — Non-Acute Institutional Stay: Payer: Medicare Other | Admitting: Internal Medicine

## 2023-01-14 DIAGNOSIS — M546 Pain in thoracic spine: Secondary | ICD-10-CM | POA: Diagnosis not present

## 2023-01-14 DIAGNOSIS — R2681 Unsteadiness on feet: Secondary | ICD-10-CM | POA: Diagnosis not present

## 2023-01-14 DIAGNOSIS — G8929 Other chronic pain: Secondary | ICD-10-CM

## 2023-01-14 DIAGNOSIS — F0150 Vascular dementia without behavioral disturbance: Secondary | ICD-10-CM

## 2023-01-14 DIAGNOSIS — I10 Essential (primary) hypertension: Secondary | ICD-10-CM

## 2023-01-14 NOTE — Progress Notes (Addendum)
Location:  [facility] Room Number: 06A Place of Service:  ALF [phone]) Provider:  Mahlon Gammon, MD   Mahlon Gammon, MD  Patient Care Team: Mahlon Gammon, MD as PCP - General (Internal Medicine) [place], [facility] (Ophthalmology) Dermatology, [facility] (Dermatology)  Extended Emergency Contact Information Primary Emergency Contact: Jarrett Ables [place] of [place] Home Phone: [phone] Mobile Phone: [phone] Relation: Daughter  Code Status:  DNR Goals of care: Advanced Directive information    01/14/2023   12:57 PM  Advanced Directives  Does Patient Have a Medical Advance Directive? Yes  Type of Advance Directive Out of facility DNR (pink MOST or yellow form);Healthcare Power of Attorney  Does patient want to make changes to medical advance directive? No - Patient declined  Copy of Healthcare Power of Attorney in Chart? Yes - validated most recent copy scanned in chart (See row information)     Chief Complaint  Patient presents with   Medical Management of Chronic Issues    Patient is being seen for a routine visit    Immunizations    Patient is due for a shingles vaccine     HPI:  Pt is a 89 y.o. female seen today for medical management of chronic diseases.   Lives in [place] in [facility]   Has h/o Dementia, HLD,Hypertension,  Diverticulitis and GERD     She is stable. No new Nursing issues. No Behavior issues Her weight is stable Walks with her walker Did get Prednisone small dose for her Back pain few weeks ago  which is now better No Falls Wt Readings from Last 3 Encounters:  01/14/23 150 lb 6.4 oz (68.2 kg)  11/11/22 150 lb 12.8 oz (68.4 kg)  11/04/22 150 lb 12.8 oz (68.4 kg)    Past Medical History:  Diagnosis Date   Anxiety    Cataract    Chest pain 02/16/2010   Myoview, negative for pharmacologic-stress induced ischemia, EF 74   Complication of anesthesia 1954   hard to wake up   Diverticulosis    DUB  (dysfunctional uterine bleeding)    Dysrhythmia    h/o PVCs- none recently- no meds   Fall    GERD (gastroesophageal reflux disease)    Hearing reduced    Hypertension    no meds currently   Loss of consciousness (HCC)    Memory loss    Osteoarthritis of left hip    Osteoporosis    Palpitation 02/02/2013   A CardioNet MCOT (mobile cardiac outpatient telemetry ): NSR with PACs; sinus tachycardia with ventricular trigeminy. Brief atrial run. Facet cardiac; ventricular trigeminy and accelerated idioventricular rhythm. No significant tachyarrhythmias.   Palpitations    Postmenopausal bleeding 11/01/2014   Unsteady gait    Varicose veins of both legs with pain    Past Surgical History:  Procedure Laterality Date   APPENDECTOMY     CATARACT EXTRACTION     both eyes   CESAREAN SECTION  1962, 1963, [id number]   x 4   COLONOSCOPY  01/14/2017   DIAGNOSTIC LAPAROSCOPY     ECTOPIC PREGNANCY SURGERY  1966   FRACTURE SURGERY     HIP SURGERY Right    HYSTEROSCOPY WITH D & C N/A 11/02/2014   Procedure: DILATATION AND CURETTAGE /HYSTEROSCOPY;  Surgeon: Sherian Rein, MD;  Location: [facility];  Service: Gynecology;  Laterality: N/A;   Lower Extremity Venous Dopplers  01/2013   No thrombus or thrombophlebitis. R Greater Saphenous Vein - enlarged  without other insufficiency (6 mm); right and left Small Saphenous Vein: Patent no valve insufficiency.   TONSILLECTOMY     TONSILLECTOMY AND ADENOIDECTOMY     TRANSTHORACIC ECHOCARDIOGRAM  01/2013   Normal LV size and function. EF 55-60%. Grade 1 diastolic dysfunction. Elevated LV filling pressures. No regional W. May.    Allergies  Allergen Reactions   Evista [Raloxifene]    Fosamax [Alendronate]    Penicillins    Polyoxyethylene Lauryl Ether [Sorbitan]    Moxifloxacin Rash    Outpatient Encounter Medications as of 01/14/2023  Medication Sig   acetaminophen (TYLENOL 8 HOUR ARTHRITIS PAIN) 650 MG CR tablet Take 650 mg by mouth 2 (two) times  daily.   acetaminophen (TYLENOL) 500 MG tablet Take 1,000 mg by mouth 2 (two) times daily as needed.   Calcium Carbonate (CALCIUM 600 PO) Take 1 tablet by mouth daily.   calcium carbonate (TUMS - DOSED IN MG ELEMENTAL CALCIUM) 500 MG chewable tablet Chew 1 tablet by mouth 2 (two) times daily as needed for indigestion or heartburn.   diclofenac Sodium (VOLTAREN) 1 % GEL Apply 1 g topically 3 (three) times daily.   Emollient (CERAVE) LOTN Apply 1 application. topically daily.   fluticasone (FLONASE) 50 MCG/ACT nasal spray Place 2 sprays into both nostrils daily.   memantine (NAMENDA) 10 MG tablet Take 10 mg by mouth 2 (two) times daily.   MULTIPLE VITAMIN PO Take by mouth daily.   lidocaine 4 % Place 1 patch onto the skin daily. (Patient not taking: Reported on 01/14/2023)   No facility-administered encounter medications on file as of 01/14/2023.    Review of Systems  Constitutional:  Negative for activity change and appetite change.  HENT: Negative.    Respiratory:  Negative for cough and shortness of breath.   Cardiovascular:  Negative for leg swelling.  Gastrointestinal:  Negative for constipation.  Genitourinary: Negative.   Musculoskeletal:  Negative for arthralgias, gait problem and myalgias.  Skin: Negative.   Neurological:  Negative for dizziness and weakness.  Psychiatric/Behavioral:  Negative for confusion, dysphoric mood and sleep disturbance.     Immunization History  Administered Date(s) Administered   Fluad Quad(high Dose 65+) 07/08/2022   Influenza, Quadrivalent, Recombinant, Inj, Pf 06/18/2018, 05/04/2019, 06/15/2020   Influenza-Unspecified 09/15/2019, 07/08/2021   Moderna Sars-Covid-2 Vaccination 09/15/2019   PFIZER(Purple Top)SARS-COV-2 Vaccination 10/03/2019, 10/22/2019, 09/03/2020   Pfizer Covid-19 Vaccine Bivalent Booster 31yrs & up 06/04/2021   Pneumococcal Conjugate-13 05/28/2014   Pneumococcal Polysaccharide-23 07/15/1998, 06/15/2005   Tdap 05/26/2016    Unspecified SARS-COV-2 Vaccination 03/03/2022   Zoster, Live 05/18/2013, 05/28/2014   Pertinent  Health Maintenance Due  Topic Date Due   DEXA SCAN  11/05/2023 (Originally 03/06/1998)   INFLUENZA VACCINE  04/15/2023      11/03/2018    1:24 PM 10/23/2019    1:44 PM 05/14/2021    1:40 PM 05/22/2022    2:37 PM 07/23/2022    2:47 PM  Fall Risk  Falls in the past year?  0 1 0 0  Was there an injury with Fall?   0 0 0  Fall Risk Category Calculator   1 0 0  Fall Risk Category (Retired)   Low Low Low  (RETIRED) Patient Fall Risk Level Low fall risk  Moderate fall risk Low fall risk Low fall risk  Patient at Risk for Falls Due to   History of fall(s);Impaired balance/gait History of fall(s) No Fall Risks  Fall risk Follow up   Falls evaluation completed;Education provided;Falls prevention  discussed Falls evaluation completed;Education provided;Falls prevention discussed Falls evaluation completed   Functional Status Survey:    Vitals:   01/14/23 1253  BP: 123/72  Pulse: 60  Resp: 20  Temp: 98.6 F (37 C)  TempSrc: Temporal  SpO2: 99%  Weight: 150 lb 6.4 oz (68.2 kg)  Height: 5\' 5"  (1.651 m)   Body mass index is 25.03 kg/m. Physical Exam Vitals reviewed.  Constitutional:      Appearance: Normal appearance.  HENT:     Head: Normocephalic.     Nose: Nose normal.     Mouth/Throat:     Mouth: Mucous membranes are moist.     Pharynx: Oropharynx is clear.  Eyes:     Pupils: Pupils are equal, round, and reactive to light.  Cardiovascular:     Rate and Rhythm: Normal rate and regular rhythm.     Pulses: Normal pulses.     Heart sounds: Normal heart sounds. No murmur heard. Pulmonary:     Effort: Pulmonary effort is normal.     Breath sounds: Normal breath sounds.  Abdominal:     General: Abdomen is flat. Bowel sounds are normal.     Palpations: Abdomen is soft.  Musculoskeletal:        General: No swelling.     Cervical back: Neck supple.  Skin:    General: Skin is warm.   Neurological:     General: No focal deficit present.     Mental Status: She is alert and oriented to person, place, and time.  Psychiatric:        Mood and Affect: Mood normal.        Thought Content: Thought content normal.     Labs reviewed: Recent Labs    04/23/22 0000 11/05/22 0000  NA 138 138  K 4.4 4.3  CL 107 109*  CO2 26* 25*  BUN 27* 27*  CREATININE 0.8 0.9  CALCIUM 8.9 9.0   Recent Labs    04/23/22 0000 11/05/22 0000  AST 15 13  ALT 10 14  ALKPHOS  --  98  ALBUMIN 3.3* 3.3*   Recent Labs    04/23/22 0000 11/05/22 0000  WBC 4.6 5.0  NEUTROABS 2,277.00 2,615.00  HGB 11.6* 11.2*  HCT 35* 33*  PLT 203 243   Lab Results  Component Value Date   TSH 4.09 10/21/2021   No results found for: "HGBA1C" Lab Results  Component Value Date   CHOL 156 08/27/2020   HDL 69 08/27/2020   LDLCALC 75 08/27/2020   TRIG 62 08/27/2020   CHOLHDL 2.8 02/15/2010    Significant Diagnostic Results in last 30 days:  No results found.  Assessment/Plan 1. Essential hypertension - labile Not on Any meds anymore  2. Chronic midline thoracic back pain Tylenol Walks with her walker  3. Vascular dementia without behavioral disturbance (HCC) Continues to do well in AL setting On [place] [address] Osteoporosis Family does not want any treatment    Family/ staff Communication:   Labs/tests ordered:

## 2023-01-15 DIAGNOSIS — R2681 Unsteadiness on feet: Secondary | ICD-10-CM | POA: Diagnosis not present

## 2023-01-15 DIAGNOSIS — R278 Other lack of coordination: Secondary | ICD-10-CM | POA: Diagnosis not present

## 2023-01-15 DIAGNOSIS — M6281 Muscle weakness (generalized): Secondary | ICD-10-CM | POA: Diagnosis not present

## 2023-01-15 DIAGNOSIS — H9190 Unspecified hearing loss, unspecified ear: Secondary | ICD-10-CM | POA: Diagnosis not present

## 2023-01-15 DIAGNOSIS — G309 Alzheimer's disease, unspecified: Secondary | ICD-10-CM | POA: Diagnosis not present

## 2023-01-15 DIAGNOSIS — R41841 Cognitive communication deficit: Secondary | ICD-10-CM | POA: Diagnosis not present

## 2023-01-18 DIAGNOSIS — G309 Alzheimer's disease, unspecified: Secondary | ICD-10-CM | POA: Diagnosis not present

## 2023-01-18 DIAGNOSIS — R41841 Cognitive communication deficit: Secondary | ICD-10-CM | POA: Diagnosis not present

## 2023-01-18 DIAGNOSIS — R278 Other lack of coordination: Secondary | ICD-10-CM | POA: Diagnosis not present

## 2023-01-18 DIAGNOSIS — H9190 Unspecified hearing loss, unspecified ear: Secondary | ICD-10-CM | POA: Diagnosis not present

## 2023-01-18 DIAGNOSIS — R2681 Unsteadiness on feet: Secondary | ICD-10-CM | POA: Diagnosis not present

## 2023-01-18 DIAGNOSIS — M6281 Muscle weakness (generalized): Secondary | ICD-10-CM | POA: Diagnosis not present

## 2023-01-19 DIAGNOSIS — R2681 Unsteadiness on feet: Secondary | ICD-10-CM | POA: Diagnosis not present

## 2023-01-20 DIAGNOSIS — H9190 Unspecified hearing loss, unspecified ear: Secondary | ICD-10-CM | POA: Diagnosis not present

## 2023-01-20 DIAGNOSIS — G309 Alzheimer's disease, unspecified: Secondary | ICD-10-CM | POA: Diagnosis not present

## 2023-01-20 DIAGNOSIS — M6281 Muscle weakness (generalized): Secondary | ICD-10-CM | POA: Diagnosis not present

## 2023-01-20 DIAGNOSIS — R278 Other lack of coordination: Secondary | ICD-10-CM | POA: Diagnosis not present

## 2023-01-20 DIAGNOSIS — R41841 Cognitive communication deficit: Secondary | ICD-10-CM | POA: Diagnosis not present

## 2023-01-20 DIAGNOSIS — R2681 Unsteadiness on feet: Secondary | ICD-10-CM | POA: Diagnosis not present

## 2023-01-21 DIAGNOSIS — R278 Other lack of coordination: Secondary | ICD-10-CM | POA: Diagnosis not present

## 2023-01-21 DIAGNOSIS — G309 Alzheimer's disease, unspecified: Secondary | ICD-10-CM | POA: Diagnosis not present

## 2023-01-21 DIAGNOSIS — H9190 Unspecified hearing loss, unspecified ear: Secondary | ICD-10-CM | POA: Diagnosis not present

## 2023-01-21 DIAGNOSIS — R41841 Cognitive communication deficit: Secondary | ICD-10-CM | POA: Diagnosis not present

## 2023-01-21 DIAGNOSIS — R2681 Unsteadiness on feet: Secondary | ICD-10-CM | POA: Diagnosis not present

## 2023-01-21 DIAGNOSIS — M6281 Muscle weakness (generalized): Secondary | ICD-10-CM | POA: Diagnosis not present

## 2023-01-22 DIAGNOSIS — R278 Other lack of coordination: Secondary | ICD-10-CM | POA: Diagnosis not present

## 2023-01-22 DIAGNOSIS — G309 Alzheimer's disease, unspecified: Secondary | ICD-10-CM | POA: Diagnosis not present

## 2023-01-22 DIAGNOSIS — R41841 Cognitive communication deficit: Secondary | ICD-10-CM | POA: Diagnosis not present

## 2023-01-22 DIAGNOSIS — R2681 Unsteadiness on feet: Secondary | ICD-10-CM | POA: Diagnosis not present

## 2023-01-22 DIAGNOSIS — H9190 Unspecified hearing loss, unspecified ear: Secondary | ICD-10-CM | POA: Diagnosis not present

## 2023-01-22 DIAGNOSIS — M6281 Muscle weakness (generalized): Secondary | ICD-10-CM | POA: Diagnosis not present

## 2023-01-25 DIAGNOSIS — R2681 Unsteadiness on feet: Secondary | ICD-10-CM | POA: Diagnosis not present

## 2023-01-27 DIAGNOSIS — G309 Alzheimer's disease, unspecified: Secondary | ICD-10-CM | POA: Diagnosis not present

## 2023-01-27 DIAGNOSIS — H9190 Unspecified hearing loss, unspecified ear: Secondary | ICD-10-CM | POA: Diagnosis not present

## 2023-01-27 DIAGNOSIS — R278 Other lack of coordination: Secondary | ICD-10-CM | POA: Diagnosis not present

## 2023-01-27 DIAGNOSIS — R41841 Cognitive communication deficit: Secondary | ICD-10-CM | POA: Diagnosis not present

## 2023-01-27 DIAGNOSIS — R2681 Unsteadiness on feet: Secondary | ICD-10-CM | POA: Diagnosis not present

## 2023-01-27 DIAGNOSIS — M6281 Muscle weakness (generalized): Secondary | ICD-10-CM | POA: Diagnosis not present

## 2023-01-28 DIAGNOSIS — R2681 Unsteadiness on feet: Secondary | ICD-10-CM | POA: Diagnosis not present

## 2023-01-29 DIAGNOSIS — R278 Other lack of coordination: Secondary | ICD-10-CM | POA: Diagnosis not present

## 2023-01-29 DIAGNOSIS — R41841 Cognitive communication deficit: Secondary | ICD-10-CM | POA: Diagnosis not present

## 2023-01-29 DIAGNOSIS — M6281 Muscle weakness (generalized): Secondary | ICD-10-CM | POA: Diagnosis not present

## 2023-01-29 DIAGNOSIS — R2681 Unsteadiness on feet: Secondary | ICD-10-CM | POA: Diagnosis not present

## 2023-01-29 DIAGNOSIS — H9190 Unspecified hearing loss, unspecified ear: Secondary | ICD-10-CM | POA: Diagnosis not present

## 2023-01-29 DIAGNOSIS — G309 Alzheimer's disease, unspecified: Secondary | ICD-10-CM | POA: Diagnosis not present

## 2023-02-01 DIAGNOSIS — R2681 Unsteadiness on feet: Secondary | ICD-10-CM | POA: Diagnosis not present

## 2023-02-03 DIAGNOSIS — M6281 Muscle weakness (generalized): Secondary | ICD-10-CM | POA: Diagnosis not present

## 2023-02-03 DIAGNOSIS — R41841 Cognitive communication deficit: Secondary | ICD-10-CM | POA: Diagnosis not present

## 2023-02-03 DIAGNOSIS — R2681 Unsteadiness on feet: Secondary | ICD-10-CM | POA: Diagnosis not present

## 2023-02-03 DIAGNOSIS — H9190 Unspecified hearing loss, unspecified ear: Secondary | ICD-10-CM | POA: Diagnosis not present

## 2023-02-03 DIAGNOSIS — G309 Alzheimer's disease, unspecified: Secondary | ICD-10-CM | POA: Diagnosis not present

## 2023-02-03 DIAGNOSIS — R278 Other lack of coordination: Secondary | ICD-10-CM | POA: Diagnosis not present

## 2023-02-04 DIAGNOSIS — H9190 Unspecified hearing loss, unspecified ear: Secondary | ICD-10-CM | POA: Diagnosis not present

## 2023-02-04 DIAGNOSIS — R41841 Cognitive communication deficit: Secondary | ICD-10-CM | POA: Diagnosis not present

## 2023-02-04 DIAGNOSIS — R2681 Unsteadiness on feet: Secondary | ICD-10-CM | POA: Diagnosis not present

## 2023-02-04 DIAGNOSIS — R278 Other lack of coordination: Secondary | ICD-10-CM | POA: Diagnosis not present

## 2023-02-04 DIAGNOSIS — G309 Alzheimer's disease, unspecified: Secondary | ICD-10-CM | POA: Diagnosis not present

## 2023-02-04 DIAGNOSIS — M6281 Muscle weakness (generalized): Secondary | ICD-10-CM | POA: Diagnosis not present

## 2023-02-07 DIAGNOSIS — R2681 Unsteadiness on feet: Secondary | ICD-10-CM | POA: Diagnosis not present

## 2023-02-10 DIAGNOSIS — G309 Alzheimer's disease, unspecified: Secondary | ICD-10-CM | POA: Diagnosis not present

## 2023-02-10 DIAGNOSIS — R41841 Cognitive communication deficit: Secondary | ICD-10-CM | POA: Diagnosis not present

## 2023-02-10 DIAGNOSIS — H9190 Unspecified hearing loss, unspecified ear: Secondary | ICD-10-CM | POA: Diagnosis not present

## 2023-02-10 DIAGNOSIS — M6281 Muscle weakness (generalized): Secondary | ICD-10-CM | POA: Diagnosis not present

## 2023-02-10 DIAGNOSIS — R278 Other lack of coordination: Secondary | ICD-10-CM | POA: Diagnosis not present

## 2023-02-10 DIAGNOSIS — R2681 Unsteadiness on feet: Secondary | ICD-10-CM | POA: Diagnosis not present

## 2023-02-11 ENCOUNTER — Non-Acute Institutional Stay: Payer: Medicare Other | Admitting: Internal Medicine

## 2023-02-11 DIAGNOSIS — I10 Essential (primary) hypertension: Secondary | ICD-10-CM | POA: Diagnosis not present

## 2023-02-11 DIAGNOSIS — F0150 Vascular dementia without behavioral disturbance: Secondary | ICD-10-CM | POA: Diagnosis not present

## 2023-02-11 DIAGNOSIS — R2681 Unsteadiness on feet: Secondary | ICD-10-CM | POA: Diagnosis not present

## 2023-02-11 DIAGNOSIS — R600 Localized edema: Secondary | ICD-10-CM | POA: Diagnosis not present

## 2023-02-11 DIAGNOSIS — L03119 Cellulitis of unspecified part of limb: Secondary | ICD-10-CM | POA: Diagnosis not present

## 2023-02-11 DIAGNOSIS — R278 Other lack of coordination: Secondary | ICD-10-CM | POA: Diagnosis not present

## 2023-02-11 DIAGNOSIS — G309 Alzheimer's disease, unspecified: Secondary | ICD-10-CM | POA: Diagnosis not present

## 2023-02-11 DIAGNOSIS — M6281 Muscle weakness (generalized): Secondary | ICD-10-CM | POA: Diagnosis not present

## 2023-02-11 DIAGNOSIS — H9190 Unspecified hearing loss, unspecified ear: Secondary | ICD-10-CM | POA: Diagnosis not present

## 2023-02-11 DIAGNOSIS — R41841 Cognitive communication deficit: Secondary | ICD-10-CM | POA: Diagnosis not present

## 2023-02-11 LAB — CBC AND DIFFERENTIAL
HCT: 32 — AB (ref 36–46)
Hemoglobin: 10.6 — AB (ref 12.0–16.0)
Neutrophils Absolute: 3404
WBC: 5.9

## 2023-02-11 LAB — HEPATIC FUNCTION PANEL
ALT: 13 U/L (ref 7–35)
AST: 15 (ref 13–35)
Alkaline Phosphatase: 106 (ref 25–125)

## 2023-02-11 LAB — COMPREHENSIVE METABOLIC PANEL
Albumin: 3.7 (ref 3.5–5.0)
Calcium: 9.4 (ref 8.7–10.7)
Globulin: 2.3

## 2023-02-11 LAB — BASIC METABOLIC PANEL
BUN: 28 — AB (ref 4–21)
CO2: 24 — AB (ref 13–22)
Chloride: 106 (ref 99–108)
Creatinine: 0.9 (ref 0.5–1.1)
Glucose: 76
Potassium: 4.1 mEq/L (ref 3.5–5.1)
Sodium: 136 — AB (ref 137–147)

## 2023-02-11 LAB — CBC: RBC: 3.49 — AB (ref 3.87–5.11)

## 2023-02-11 NOTE — Progress Notes (Signed)
[facility] Service:  ALF (13)  Provider:   Code Status: DNR Goals of Care:     01/14/2023   12:57 PM  Advanced Directives  Does Patient Have a Medical Advance Directive? Yes  Type of Advance Directive Out of facility DNR (pink MOST or yellow form);Healthcare Power of Attorney  Does patient want to make changes to medical advance directive? No - Patient declined  Copy of Healthcare Power of Attorney in Chart? Yes - validated most recent copy scanned in chart (See row information)     Chief Complaint  Patient presents with   Acute Visit    HPI: Patient is a 89 y.o. female seen today for an acute visit for Bilateral LE edema redness Lives in AL Has h/o Dementia, HLD,Hypertension,  Diverticulitis and GERD    Noticed by Nurse with redness and LE edema Last night No Falls No fever No Injury Some pain in legs Weight is stable  Past Medical History:  Diagnosis Date   Anxiety    Cataract    Chest pain 02/16/2010   Myoview, negative for pharmacologic-stress induced ischemia, EF 74   Complication of anesthesia 1954   hard to wake up   Diverticulosis    DUB (dysfunctional uterine bleeding)    Dysrhythmia    h/o PVCs- none recently- no meds   Fall    GERD (gastroesophageal reflux disease)    Hearing reduced    Hypertension    no meds currently   Loss of consciousness (HCC)    Memory loss    Osteoarthritis of left hip    Osteoporosis    Palpitation 02/02/2013   A CardioNet MCOT (mobile cardiac outpatient telemetry ): NSR with PACs; sinus tachycardia with ventricular trigeminy. Brief atrial run. Facet cardiac; ventricular trigeminy and accelerated idioventricular rhythm. No significant tachyarrhythmias.   Palpitations    Postmenopausal bleeding 11/01/2014   Unsteady gait    Varicose veins of both legs with pain     Past Surgical History:  Procedure Laterality Date   APPENDECTOMY     CATARACT EXTRACTION     both eyes   CESAREAN SECTION   1962, 1963, [id number]   x 4   COLONOSCOPY  01/14/2017   DIAGNOSTIC LAPAROSCOPY     ECTOPIC PREGNANCY SURGERY  1966   FRACTURE SURGERY     HIP SURGERY Right    HYSTEROSCOPY WITH D & C N/A 11/02/2014   Procedure: DILATATION AND CURETTAGE /HYSTEROSCOPY;  Surgeon: Sherian Rein, MD;  Location: [facility];  Service: Gynecology;  Laterality: N/A;   Lower Extremity Venous Dopplers  01/2013   No thrombus or thrombophlebitis. R Greater Saphenous Vein - enlarged without other insufficiency (6 mm); right and left Small Saphenous Vein: Patent no valve insufficiency.   TONSILLECTOMY     TONSILLECTOMY AND ADENOIDECTOMY     TRANSTHORACIC ECHOCARDIOGRAM  01/2013   Normal LV size and function. EF 55-60%. Grade 1 diastolic dysfunction. Elevated LV filling pressures. No regional W. May.    Allergies  Allergen Reactions   Evista [Raloxifene]    Fosamax [Alendronate]    Penicillins    Polyoxyethylene Lauryl Ether [Sorbitan]    Moxifloxacin Rash    Outpatient Encounter Medications as of 02/11/2023  Medication Sig   doxycycline (ADOXA) 100 MG tablet Take 100 mg by mouth 2 (two) times daily.   furosemide (LASIX) 40 MG tablet Take 40 mg by mouth.   potassium chloride SA (KLOR-CON M) 20 MEQ tablet Take 20 mEq  by mouth once.   acetaminophen (TYLENOL 8 HOUR ARTHRITIS PAIN) 650 MG CR tablet Take 650 mg by mouth 2 (two) times daily.   acetaminophen (TYLENOL) 500 MG tablet Take 1,000 mg by mouth 2 (two) times daily as needed.   Calcium Carbonate (CALCIUM 600 PO) Take 1 tablet by mouth daily.   calcium carbonate (TUMS - DOSED IN MG ELEMENTAL CALCIUM) 500 MG chewable tablet Chew 1 tablet by mouth 2 (two) times daily as needed for indigestion or heartburn.   diclofenac Sodium (VOLTAREN) 1 % GEL Apply 1 g topically 3 (three) times daily.   Emollient (CERAVE) LOTN Apply 1 application. topically daily.   fluticasone (FLONASE) 50 MCG/ACT nasal spray Place 2 sprays into both nostrils daily.   memantine (NAMENDA)  10 MG tablet Take 10 mg by mouth 2 (two) times daily.   MULTIPLE VITAMIN PO Take by mouth daily.   No facility-administered encounter medications on file as of 02/11/2023.    Review of Systems:  Review of Systems  Unable to perform ROS: Dementia    Health Maintenance  Topic Date Due   Zoster Vaccines- Shingrix (1 of 2) Never done   COVID-19 Vaccine (7 - 2023-24 season) 05/14/2023 (Originally 05/15/2022)   DEXA SCAN  11/05/2023 (Originally 03/06/1998)   INFLUENZA VACCINE  04/15/2023   Medicare Annual Wellness (AWV)  05/23/2023   DTaP/Tdap/Td (2 - Td or Tdap) 05/26/2026   Pneumonia Vaccine 29+ Years old  Completed   HPV VACCINES  Aged Out    Physical Exam: Vitals:   02/12/23 1452  BP: (!) 140/70  Pulse: 68  Resp: 18  Temp: 97.6 F (36.4 C)  Weight: 150 lb 6.4 oz (68.2 kg)   Body mass index is 25.03 kg/m. Physical Exam Vitals reviewed.  Constitutional:      Appearance: Normal appearance.  HENT:     Head: Normocephalic.     Nose: Nose normal.     Mouth/Throat:     Mouth: Mucous membranes are moist.     Pharynx: Oropharynx is clear.  Eyes:     Pupils: Pupils are equal, round, and reactive to light.  Cardiovascular:     Rate and Rhythm: Normal rate and regular rhythm.     Pulses: Normal pulses.     Heart sounds: Normal heart sounds. No murmur heard. Pulmonary:     Effort: Pulmonary effort is normal.     Breath sounds: Normal breath sounds.  Abdominal:     General: Abdomen is flat. Bowel sounds are normal.     Palpations: Abdomen is soft.  Musculoskeletal:        General: Swelling present.     Cervical back: Neck supple.     Comments: Moderate swelling Bilateral With Redness Some tender  Skin:    General: Skin is warm.  Neurological:     General: No focal deficit present.     Mental Status: She is alert and oriented to person, place, and time.  Psychiatric:        Mood and Affect: Mood normal.        Thought Content: Thought content normal.     Labs  reviewed: Basic Metabolic Panel: Recent Labs    04/23/22 0000 11/05/22 0000  NA 138 138  K 4.4 4.3  CL 107 109*  CO2 26* 25*  BUN 27* 27*  CREATININE 0.8 0.9  CALCIUM 8.9 9.0   Liver Function Tests: Recent Labs    04/23/22 0000 11/05/22 0000  AST 15 13  ALT 10  14  ALKPHOS  --  98  ALBUMIN 3.3* 3.3*   No results for input(s): "LIPASE", "AMYLASE" in the last 8760 hours. No results for input(s): "AMMONIA" in the last 8760 hours. CBC: Recent Labs    04/23/22 0000 11/05/22 0000  WBC 4.6 5.0  NEUTROABS 2,277.00 2,615.00  HGB 11.6* 11.2*  HCT 35* 33*  PLT 203 243   Lipid Panel: No results for input(s): "CHOL", "HDL", "LDLCALC", "TRIG", "CHOLHDL", "LDLDIRECT" in the last 8760 hours. No results found for: "HGBA1C"  Procedures since last visit: No results found.  Assessment/Plan 1. Cellulitis of lower extremity, unspecified laterality WBC normal Hgb 10.6 Doxycyline for 10 days Reval  2. Bilateral leg edema Worsening Lasix 40 mg QD Potassium 3. Essential hypertension - labile Lasix will Help with BP  4. Vascular dementia without behavioral disturbance (HCC) Doing well otherwise    Labs/tests ordered:  * No order type specified * Next appt:  Visit date not found

## 2023-02-12 ENCOUNTER — Encounter: Payer: Self-pay | Admitting: Internal Medicine

## 2023-02-12 DIAGNOSIS — R2681 Unsteadiness on feet: Secondary | ICD-10-CM | POA: Diagnosis not present

## 2023-02-15 DIAGNOSIS — R2681 Unsteadiness on feet: Secondary | ICD-10-CM | POA: Diagnosis not present

## 2023-02-17 DIAGNOSIS — R41841 Cognitive communication deficit: Secondary | ICD-10-CM | POA: Diagnosis not present

## 2023-02-17 DIAGNOSIS — G309 Alzheimer's disease, unspecified: Secondary | ICD-10-CM | POA: Diagnosis not present

## 2023-02-17 DIAGNOSIS — R2681 Unsteadiness on feet: Secondary | ICD-10-CM | POA: Diagnosis not present

## 2023-02-17 DIAGNOSIS — H9190 Unspecified hearing loss, unspecified ear: Secondary | ICD-10-CM | POA: Diagnosis not present

## 2023-02-18 ENCOUNTER — Non-Acute Institutional Stay: Payer: Medicare Other | Admitting: Internal Medicine

## 2023-02-18 DIAGNOSIS — R2681 Unsteadiness on feet: Secondary | ICD-10-CM | POA: Diagnosis not present

## 2023-02-18 DIAGNOSIS — G309 Alzheimer's disease, unspecified: Secondary | ICD-10-CM | POA: Diagnosis not present

## 2023-02-18 DIAGNOSIS — R600 Localized edema: Secondary | ICD-10-CM

## 2023-02-18 DIAGNOSIS — I10 Essential (primary) hypertension: Secondary | ICD-10-CM | POA: Diagnosis not present

## 2023-02-18 DIAGNOSIS — R41841 Cognitive communication deficit: Secondary | ICD-10-CM | POA: Diagnosis not present

## 2023-02-18 DIAGNOSIS — H9190 Unspecified hearing loss, unspecified ear: Secondary | ICD-10-CM | POA: Diagnosis not present

## 2023-02-18 DIAGNOSIS — L03119 Cellulitis of unspecified part of limb: Secondary | ICD-10-CM | POA: Diagnosis not present

## 2023-02-18 LAB — BASIC METABOLIC PANEL
BUN: 27 — AB (ref 4–21)
CO2: 24 — AB (ref 13–22)
Chloride: 103 (ref 99–108)
Creatinine: 1 (ref 0.5–1.1)
Glucose: 100
Potassium: 4.2 mEq/L (ref 3.5–5.1)
Sodium: 136 — AB (ref 137–147)

## 2023-02-18 LAB — COMPREHENSIVE METABOLIC PANEL: Calcium: 9.7 (ref 8.7–10.7)

## 2023-02-18 NOTE — Progress Notes (Signed)
[facility]:  ALF (13)  Provider:   Code Status: DNR Goals of Care:     01/14/2023   12:57 PM  Advanced Directives  Does Patient Have a Medical Advance Directive? Yes  Type of Advance Directive Out of facility DNR (pink MOST or yellow form);Healthcare Power of Attorney  Does patient want to make changes to medical advance directive? No - Patient declined  Copy of Healthcare Power of Attorney in Chart? Yes - validated most recent copy scanned in chart (See row information)     Chief Complaint  Patient presents with   Acute Visit    HPI: Patient is a 89 y.o. female seen today for an acute visit for LE edema and Redness  Lives in AL Has h/o Dementia, HLD,Hypertension,  Diverticulitis and GERD   Noticed swelling and redness in her Legs last week Started on Lasix and Doxycyline But she continues to have swelling Redness better in her Right leg but Left Leg is still red and tender No other symptoms No Fever  Leaking from both legs No SOB   Wt Readings from Last 3 Encounters:  02/19/23 155 lb 3.2 oz (70.4 kg)  02/12/23 150 lb 6.4 oz (68.2 kg)  01/14/23 150 lb 6.4 oz (68.2 kg)       Past Medical History:  Diagnosis Date   Anxiety    Cataract    Chest pain 02/16/2010   Myoview, negative for pharmacologic-stress induced ischemia, EF 74   Complication of anesthesia 1954   hard to wake up   Diverticulosis    DUB (dysfunctional uterine bleeding)    Dysrhythmia    h/o PVCs- none recently- no meds   Fall    GERD (gastroesophageal reflux disease)    Hearing reduced    Hypertension    no meds currently   Loss of consciousness (HCC)    Memory loss    Osteoarthritis of left hip    Osteoporosis    Palpitation 02/02/2013   A CardioNet MCOT (mobile cardiac outpatient telemetry ): NSR with PACs; sinus tachycardia with ventricular trigeminy. Brief atrial run. Facet cardiac; ventricular trigeminy and accelerated idioventricular rhythm. No  significant tachyarrhythmias.   Palpitations    Postmenopausal bleeding 11/01/2014   Unsteady gait    Varicose veins of both legs with pain     Past Surgical History:  Procedure Laterality Date   APPENDECTOMY     CATARACT EXTRACTION     both eyes   CESAREAN SECTION  1962, 1963, [id number]   x 4   COLONOSCOPY  01/14/2017   DIAGNOSTIC LAPAROSCOPY     ECTOPIC PREGNANCY SURGERY  1966   FRACTURE SURGERY     HIP SURGERY Right    HYSTEROSCOPY WITH D & C N/A 11/02/2014   Procedure: DILATATION AND CURETTAGE /HYSTEROSCOPY;  Surgeon: Sherian Rein, MD;  Location: [facility];  Service: Gynecology;  Laterality: N/A;   Lower Extremity Venous Dopplers  01/2013   No thrombus or thrombophlebitis. R Greater Saphenous Vein - enlarged without other insufficiency (6 mm); right and left Small Saphenous Vein: Patent no valve insufficiency.   TONSILLECTOMY     TONSILLECTOMY AND ADENOIDECTOMY     TRANSTHORACIC ECHOCARDIOGRAM  01/2013   Normal LV size and function. EF 55-60%. Grade 1 diastolic dysfunction. Elevated LV filling pressures. No regional W. May.    Allergies  Allergen Reactions   Evista [Raloxifene]    Fosamax [Alendronate]    Penicillins    Polyoxyethylene Lauryl  Ether [Sorbitan]    Moxifloxacin Rash    Outpatient Encounter Medications as of 02/18/2023  Medication Sig   acetaminophen (TYLENOL 8 HOUR ARTHRITIS PAIN) 650 MG CR tablet Take 650 mg by mouth 2 (two) times daily.   acetaminophen (TYLENOL) 500 MG tablet Take 1,000 mg by mouth 2 (two) times daily as needed.   Calcium Carbonate (CALCIUM 600 PO) Take 1 tablet by mouth daily.   calcium carbonate (TUMS - DOSED IN MG ELEMENTAL CALCIUM) 500 MG chewable tablet Chew 1 tablet by mouth 2 (two) times daily as needed for indigestion or heartburn.   diclofenac Sodium (VOLTAREN) 1 % GEL Apply 1 g topically 3 (three) times daily.   Emollient (CERAVE) LOTN Apply 1 application. topically daily.   fluticasone (FLONASE) 50 MCG/ACT nasal spray  Place 2 sprays into both nostrils daily.   furosemide (LASIX) 40 MG tablet Take 40 mg by mouth.   memantine (NAMENDA) 10 MG tablet Take 10 mg by mouth 2 (two) times daily.   MULTIPLE VITAMIN PO Take by mouth daily.   potassium chloride SA (KLOR-CON M) 20 MEQ tablet Take 20 mEq by mouth once.   No facility-administered encounter medications on file as of 02/18/2023.    Review of Systems:  Review of Systems  Constitutional:  Negative for activity change and appetite change.  HENT: Negative.    Respiratory:  Negative for cough and shortness of breath.   Cardiovascular:  Positive for leg swelling.  Gastrointestinal:  Negative for constipation.  Genitourinary: Negative.   Musculoskeletal:  Positive for gait problem. Negative for arthralgias and myalgias.  Skin:  Positive for color change.  Neurological:  Negative for dizziness and weakness.  Psychiatric/Behavioral:  Positive for confusion. Negative for dysphoric mood and sleep disturbance.     Health Maintenance  Topic Date Due   Zoster Vaccines- Shingrix (1 of 2) Never done   COVID-19 Vaccine (7 - 2023-24 season) 05/14/2023 (Originally 05/15/2022)   DEXA SCAN  11/05/2023 (Originally 03/06/1998)   INFLUENZA VACCINE  04/15/2023   Medicare Annual Wellness (AWV)  05/23/2023   DTaP/Tdap/Td (2 - Td or Tdap) 05/26/2026   Pneumonia Vaccine 67+ Years old  Completed   HPV VACCINES  Aged Out    Physical Exam: Vitals:   02/19/23 1018  BP: 137/66  Pulse: 66  Resp: 16  Temp: (!) 97.5 F (36.4 C)  Weight: 155 lb 3.2 oz (70.4 kg)   Body mass index is 25.83 kg/m. Physical Exam Vitals reviewed.  Constitutional:      Appearance: Normal appearance.  HENT:     Head: Normocephalic.     Nose: Nose normal.     Mouth/Throat:     Mouth: Mucous membranes are moist.     Pharynx: Oropharynx is clear.  Eyes:     Pupils: Pupils are equal, round, and reactive to light.  Cardiovascular:     Rate and Rhythm: Normal rate and regular rhythm.      Pulses: Normal pulses.     Heart sounds: Normal heart sounds. No murmur heard. Pulmonary:     Effort: Pulmonary effort is normal.     Breath sounds: Normal breath sounds.  Abdominal:     General: Abdomen is flat. Bowel sounds are normal.     Palpations: Abdomen is soft.  Musculoskeletal:        General: No swelling.     Cervical back: Neck supple.     Comments: Moderate swelling Bilateral Right leg is less red Left Leg still red and warm  Skin:    General: Skin is warm.  Neurological:     General: No focal deficit present.     Mental Status: She is alert and oriented to person, place, and time.  Psychiatric:        Mood and Affect: Mood normal.        Thought Content: Thought content normal.     Labs reviewed: Basic Metabolic Panel: Recent Labs    04/23/22 0000 11/05/22 0000  NA 138 138  K 4.4 4.3  CL 107 109*  CO2 26* 25*  BUN 27* 27*  CREATININE 0.8 0.9  CALCIUM 8.9 9.0   Liver Function Tests: Recent Labs    04/23/22 0000 11/05/22 0000  AST 15 13  ALT 10 14  ALKPHOS  --  98  ALBUMIN 3.3* 3.3*   No results for input(s): "LIPASE", "AMYLASE" in the last 8760 hours. No results for input(s): "AMMONIA" in the last 8760 hours. CBC: Recent Labs    04/23/22 0000 11/05/22 0000  WBC 4.6 5.0  NEUTROABS 2,277.00 2,615.00  HGB 11.6* 11.2*  HCT 35* 33*  PLT 203 243   Lipid Panel: No results for input(s): "CHOL", "HDL", "LDLCALC", "TRIG", "CHOLHDL", "LDLDIRECT" in the last 8760 hours. No results found for: "HGBA1C"  Procedures since last visit: No results found.  Assessment/Plan 1. Cellulitis of lower extremity, unspecified laterality Still red Left more then Right and Warm and tender [place] Doxycyline for 4 more days with total of 14 days  2. Bilateral leg edema Discontinue Lasix Toresimide 40 mg for 2 days and then 20 mg QD [place] Potassium BMP in 2 weeks  3. Essential hypertension - labile Stable Labs today Bun 27 Creat 1.0 Potassium  4.2  Labs/tests ordered:  * No order type specified * Next appt:  Visit date not found

## 2023-02-19 ENCOUNTER — Encounter: Payer: Self-pay | Admitting: Internal Medicine

## 2023-02-25 ENCOUNTER — Non-Acute Institutional Stay: Payer: Medicare Other | Admitting: Internal Medicine

## 2023-02-25 DIAGNOSIS — I10 Essential (primary) hypertension: Secondary | ICD-10-CM

## 2023-02-25 DIAGNOSIS — F0150 Vascular dementia without behavioral disturbance: Secondary | ICD-10-CM | POA: Diagnosis not present

## 2023-02-25 DIAGNOSIS — R600 Localized edema: Secondary | ICD-10-CM

## 2023-02-25 DIAGNOSIS — R2681 Unsteadiness on feet: Secondary | ICD-10-CM | POA: Diagnosis not present

## 2023-02-25 DIAGNOSIS — G309 Alzheimer's disease, unspecified: Secondary | ICD-10-CM | POA: Diagnosis not present

## 2023-02-25 DIAGNOSIS — R41841 Cognitive communication deficit: Secondary | ICD-10-CM | POA: Diagnosis not present

## 2023-02-25 DIAGNOSIS — H9190 Unspecified hearing loss, unspecified ear: Secondary | ICD-10-CM | POA: Diagnosis not present

## 2023-02-26 DIAGNOSIS — G309 Alzheimer's disease, unspecified: Secondary | ICD-10-CM | POA: Diagnosis not present

## 2023-02-26 DIAGNOSIS — R2681 Unsteadiness on feet: Secondary | ICD-10-CM | POA: Diagnosis not present

## 2023-02-26 DIAGNOSIS — R41841 Cognitive communication deficit: Secondary | ICD-10-CM | POA: Diagnosis not present

## 2023-02-26 DIAGNOSIS — H9190 Unspecified hearing loss, unspecified ear: Secondary | ICD-10-CM | POA: Diagnosis not present

## 2023-03-01 ENCOUNTER — Encounter: Payer: Self-pay | Admitting: Internal Medicine

## 2023-03-01 NOTE — Progress Notes (Signed)
[facility] Service:  ALF (13)  Provider:   Code Status: DNR Goals of Care:     01/14/2023   12:57 PM  Advanced Directives  Does Patient Have a Medical Advance Directive? Yes  Type of Advance Directive Out of facility DNR (pink MOST or yellow form);Healthcare Power of Attorney  Does patient want to make changes to medical advance directive? No - Patient declined  Copy of Healthcare Power of Attorney in Chart? Yes - validated most recent copy scanned in chart (See row information)     Chief Complaint  Patient presents with   Acute Visit    HPI: Patient is a 89 y.o. female seen today for an acute visit for LE edema  Lives in AL Has h/o Dementia, HLD,Hypertension,  Diverticulitis and GERD    Noticed swelling and redness in her Legs last week Started on Lasix and Doxycyline But she continues to have swelling Redness better in her Right leg but Left Leg is still red  Leaking from both legs No SOB   Walks with her walker  No Dizziness    Past Medical History:  Diagnosis Date   Anxiety    Cataract    Chest pain 02/16/2010   Myoview, negative for pharmacologic-stress induced ischemia, EF 74   Complication of anesthesia 1954   hard to wake up   Diverticulosis    DUB (dysfunctional uterine bleeding)    Dysrhythmia    h/o PVCs- none recently- no meds   Fall    GERD (gastroesophageal reflux disease)    Hearing reduced    Hypertension    no meds currently   Loss of consciousness (HCC)    Memory loss    Osteoarthritis of left hip    Osteoporosis    Palpitation 02/02/2013   A CardioNet MCOT (mobile cardiac outpatient telemetry ): NSR with PACs; sinus tachycardia with ventricular trigeminy. Brief atrial run. Facet cardiac; ventricular trigeminy and accelerated idioventricular rhythm. No significant tachyarrhythmias.   Palpitations    Postmenopausal bleeding 11/01/2014   Unsteady gait    Varicose veins of both legs with pain     Past  Surgical History:  Procedure Laterality Date   APPENDECTOMY     CATARACT EXTRACTION     both eyes   CESAREAN SECTION  1962, 1963, [id number]   x 4   COLONOSCOPY  01/14/2017   DIAGNOSTIC LAPAROSCOPY     ECTOPIC PREGNANCY SURGERY  1966   FRACTURE SURGERY     HIP SURGERY Right    HYSTEROSCOPY WITH D & C N/A 11/02/2014   Procedure: DILATATION AND CURETTAGE /HYSTEROSCOPY;  Surgeon: Sherian Rein, MD;  Location: [facility];  Service: Gynecology;  Laterality: N/A;   Lower Extremity Venous Dopplers  01/2013   No thrombus or thrombophlebitis. R Greater Saphenous Vein - enlarged without other insufficiency (6 mm); right and left Small Saphenous Vein: Patent no valve insufficiency.   TONSILLECTOMY     TONSILLECTOMY AND ADENOIDECTOMY     TRANSTHORACIC ECHOCARDIOGRAM  01/2013   Normal LV size and function. EF 55-60%. Grade 1 diastolic dysfunction. Elevated LV filling pressures. No regional W. May.    Allergies  Allergen Reactions   Evista [Raloxifene]    Fosamax [Alendronate]    Penicillins    Polyoxyethylene Lauryl Ether [Sorbitan]    Moxifloxacin Rash    Outpatient Encounter Medications as of 02/25/2023  Medication Sig   acetaminophen (TYLENOL 8 HOUR ARTHRITIS PAIN) 650 MG CR tablet Take 650 mg  by mouth 2 (two) times daily.   acetaminophen (TYLENOL) 500 MG tablet Take 1,000 mg by mouth 2 (two) times daily as needed.   Calcium Carbonate (CALCIUM 600 PO) Take 1 tablet by mouth daily.   calcium carbonate (TUMS - DOSED IN MG ELEMENTAL CALCIUM) 500 MG chewable tablet Chew 1 tablet by mouth 2 (two) times daily as needed for indigestion or heartburn.   diclofenac Sodium (VOLTAREN) 1 % GEL Apply 1 g topically 3 (three) times daily.   Emollient (CERAVE) LOTN Apply 1 application. topically daily.   fluticasone (FLONASE) 50 MCG/ACT nasal spray Place 2 sprays into both nostrils daily.   memantine (NAMENDA) 10 MG tablet Take 10 mg by mouth 2 (two) times daily.   MULTIPLE VITAMIN PO Take by mouth  daily.   potassium chloride SA (KLOR-CON M) 20 MEQ tablet Take 20 mEq by mouth once.   torsemide (DEMADEX) 20 MG tablet Take 20 mg by mouth daily.   No facility-administered encounter medications on file as of 02/25/2023.    Review of Systems:  Review of Systems  Unable to perform ROS: Dementia    Health Maintenance  Topic Date Due   Zoster Vaccines- Shingrix (1 of 2) Never done   COVID-19 Vaccine (7 - 2023-24 season) 05/14/2023 (Originally 05/15/2022)   DEXA SCAN  11/05/2023 (Originally 03/06/1998)   INFLUENZA VACCINE  04/15/2023   Medicare Annual Wellness (AWV)  05/23/2023   DTaP/Tdap/Td (2 - Td or Tdap) 05/26/2026   Pneumonia Vaccine 15+ Years old  Completed   HPV VACCINES  Aged Out    Physical Exam: Vitals:   02/25/23 1532  BP: 119/73  Pulse: 74  Resp: 20  Temp: 97.9 F (36.6 C)  Weight: 155 lb 3.2 oz (70.4 kg)   Body mass index is 25.83 kg/m. Physical Exam Vitals reviewed.  Constitutional:      Appearance: Normal appearance.  HENT:     Head: Normocephalic.     Nose: Nose normal.     Mouth/Throat:     Mouth: Mucous membranes are moist.     Pharynx: Oropharynx is clear.  Eyes:     Pupils: Pupils are equal, round, and reactive to light.  Cardiovascular:     Rate and Rhythm: Normal rate and regular rhythm.     Pulses: Normal pulses.     Heart sounds: Normal heart sounds. No murmur heard. Pulmonary:     Effort: Pulmonary effort is normal.     Breath sounds: Normal breath sounds.  Abdominal:     General: Abdomen is flat. Bowel sounds are normal.     Palpations: Abdomen is soft.  Musculoskeletal:        General: Swelling present.     Cervical back: Neck supple.     Comments: Moderate swelling with redness and Some discharge  Skin:    General: Skin is warm.  Neurological:     General: No focal deficit present.     Mental Status: She is alert and oriented to person, place, and time.  Psychiatric:        Mood and Affect: Mood normal.        Thought Content:  Thought content normal.     Labs reviewed: Basic Metabolic Panel: Recent Labs    04/23/22 0000 11/05/22 0000  NA 138 138  K 4.4 4.3  CL 107 109*  CO2 26* 25*  BUN 27* 27*  CREATININE 0.8 0.9  CALCIUM 8.9 9.0   Liver Function Tests: Recent Labs    04/23/22  0000 11/05/22 0000  AST 15 13  ALT 10 14  ALKPHOS  --  98  ALBUMIN 3.3* 3.3*   No results for input(s): "LIPASE", "AMYLASE" in the last 8760 hours. No results for input(s): "AMMONIA" in the last 8760 hours. CBC: Recent Labs    04/23/22 0000 11/05/22 0000  WBC 4.6 5.0  NEUTROABS 2,277.00 2,615.00  HGB 11.6* 11.2*  HCT 35* 33*  PLT 203 243   Lipid Panel: No results for input(s): "CHOL", "HDL", "LDLCALC", "TRIG", "CHOLHDL", "LDLDIRECT" in the last 8760 hours. No results found for: "HGBA1C"  Procedures since last visit: No results found.  Assessment/Plan 1. Bilateral leg edema Change Torsemide to 40 mg with Potassium  2. Essential hypertension - labile Stable  3. Vascular dementia without behavioral disturbance (HCC) Continue care in AL  BMP in 2 weeks    Labs/tests ordered:  * No order type specified * Next appt:  Visit date not found

## 2023-03-03 DIAGNOSIS — R41841 Cognitive communication deficit: Secondary | ICD-10-CM | POA: Diagnosis not present

## 2023-03-03 DIAGNOSIS — H9190 Unspecified hearing loss, unspecified ear: Secondary | ICD-10-CM | POA: Diagnosis not present

## 2023-03-03 DIAGNOSIS — R2681 Unsteadiness on feet: Secondary | ICD-10-CM | POA: Diagnosis not present

## 2023-03-03 DIAGNOSIS — G309 Alzheimer's disease, unspecified: Secondary | ICD-10-CM | POA: Diagnosis not present

## 2023-03-04 DIAGNOSIS — R2681 Unsteadiness on feet: Secondary | ICD-10-CM | POA: Diagnosis not present

## 2023-03-04 DIAGNOSIS — H9190 Unspecified hearing loss, unspecified ear: Secondary | ICD-10-CM | POA: Diagnosis not present

## 2023-03-04 DIAGNOSIS — G309 Alzheimer's disease, unspecified: Secondary | ICD-10-CM | POA: Diagnosis not present

## 2023-03-04 DIAGNOSIS — R41841 Cognitive communication deficit: Secondary | ICD-10-CM | POA: Diagnosis not present

## 2023-03-10 DIAGNOSIS — H9190 Unspecified hearing loss, unspecified ear: Secondary | ICD-10-CM | POA: Diagnosis not present

## 2023-03-10 DIAGNOSIS — G309 Alzheimer's disease, unspecified: Secondary | ICD-10-CM | POA: Diagnosis not present

## 2023-03-10 DIAGNOSIS — R2681 Unsteadiness on feet: Secondary | ICD-10-CM | POA: Diagnosis not present

## 2023-03-10 DIAGNOSIS — R41841 Cognitive communication deficit: Secondary | ICD-10-CM | POA: Diagnosis not present

## 2023-03-11 DIAGNOSIS — H9190 Unspecified hearing loss, unspecified ear: Secondary | ICD-10-CM | POA: Diagnosis not present

## 2023-03-11 DIAGNOSIS — I10 Essential (primary) hypertension: Secondary | ICD-10-CM | POA: Diagnosis not present

## 2023-03-11 DIAGNOSIS — R2681 Unsteadiness on feet: Secondary | ICD-10-CM | POA: Diagnosis not present

## 2023-03-11 DIAGNOSIS — G309 Alzheimer's disease, unspecified: Secondary | ICD-10-CM | POA: Diagnosis not present

## 2023-03-11 DIAGNOSIS — R41841 Cognitive communication deficit: Secondary | ICD-10-CM | POA: Diagnosis not present

## 2023-03-12 ENCOUNTER — Non-Acute Institutional Stay: Payer: Medicare Other | Admitting: Orthopedic Surgery

## 2023-03-12 ENCOUNTER — Encounter: Payer: Self-pay | Admitting: Orthopedic Surgery

## 2023-03-12 DIAGNOSIS — I872 Venous insufficiency (chronic) (peripheral): Secondary | ICD-10-CM | POA: Diagnosis not present

## 2023-03-12 LAB — BASIC METABOLIC PANEL
BUN: 29 — AB (ref 4–21)
CO2: 25 — AB (ref 13–22)
Chloride: 107 (ref 99–108)
Creatinine: 1.2 — AB (ref 0.5–1.1)
Glucose: 70
Potassium: 4.1 mEq/L (ref 3.5–5.1)
Sodium: 140 (ref 137–147)

## 2023-03-12 LAB — COMPREHENSIVE METABOLIC PANEL: Calcium: 8.9 (ref 8.7–10.7)

## 2023-03-12 NOTE — Progress Notes (Signed)
Location:   [facility]  Nursing Home Room Number: 6-A Place of Service:  ALF [phone]) Provider:  Hazle Nordmann, NP  PCP: Mahlon Gammon, MD  Patient Care Team: Mahlon Gammon, MD as PCP - General (Internal Medicine) Pa, [facility] (Ophthalmology) Dermatology, [facility] (Dermatology)  Extended Emergency Contact Information Primary Emergency Contact: Jarrett Ables [place] Home Phone: [phone] Mobile Phone: [phone] Relation: Daughter  Code Status:  DNR Goals of care: Advanced Directive information    03/12/2023   10:33 AM  Advanced Directives  Does Patient Have a Medical Advance Directive? Yes  Type of Estate agent of [place];Out of facility DNR (pink MOST or yellow form)  Does patient want to make changes to medical advance directive? No - Patient declined  Copy of Healthcare Power of Attorney in Chart? Yes - validated most recent copy scanned in chart (See row information)     Chief Complaint  Patient presents with   Acute Visit    Leg swelling    HPI:  Pt is a 89 y.o. female seen today for an acute visit due to ongoing lower leg swelling.   She continues to reside in assisted living at [facility]. PMH: HTN, HLD, venous insufficiency, GERD, dementia, right hip surgery, and diverticulitis.    Increased swelling to BLE x 1 month. She also had increased erythema and tenderness to BLE. She was started on furosemide and doxycycline. Edema has improved. Skin still appears red at times. Recent labs reveal BUN/creat 29/1.24 03/11/2023. She is taking torsemide daily. Poor historian due to dementia. Afebrile. Vitals stable.   Recent blood pressures:   06/26- 101/56  06/19- 118/54  06/12- 119/73  06/05- 137/66  Past Medical History:  Diagnosis Date   Anxiety    Cataract    Chest pain 02/16/2010   Myoview, negative for pharmacologic-stress induced ischemia, EF 74   Complication of anesthesia 1954   hard to  wake up   Diverticulosis    DUB (dysfunctional uterine bleeding)    Dysrhythmia    h/o PVCs- none recently- no meds   Fall    GERD (gastroesophageal reflux disease)    Hearing reduced    Hypertension    no meds currently   Loss of consciousness (HCC)    Memory loss    Osteoarthritis of left hip    Osteoporosis    Palpitation 02/02/2013   A CardioNet MCOT (mobile cardiac outpatient telemetry ): NSR with PACs; sinus tachycardia with ventricular trigeminy. Brief atrial run. Facet cardiac; ventricular trigeminy and accelerated idioventricular rhythm. No significant tachyarrhythmias.   Palpitations    Postmenopausal bleeding 11/01/2014   Unsteady gait    Varicose veins of both legs with pain    Past Surgical History:  Procedure Laterality Date   APPENDECTOMY     CATARACT EXTRACTION     both eyes   CESAREAN SECTION  1962, 1963, [id number]   x 4   COLONOSCOPY  01/14/2017   DIAGNOSTIC LAPAROSCOPY     ECTOPIC PREGNANCY SURGERY  1966   FRACTURE SURGERY     HIP SURGERY Right    HYSTEROSCOPY WITH D & C N/A 11/02/2014   Procedure: DILATATION AND CURETTAGE /HYSTEROSCOPY;  Surgeon: Sherian Rein, MD;  Location: [facility];  Service: Gynecology;  Laterality: N/A;   Lower Extremity Venous Dopplers  01/2013   No thrombus or thrombophlebitis. R Greater Saphenous Vein - enlarged without other insufficiency (6 mm); right and left Small Saphenous Vein: Patent no  valve insufficiency.   TONSILLECTOMY     TONSILLECTOMY AND ADENOIDECTOMY     TRANSTHORACIC ECHOCARDIOGRAM  01/2013   Normal LV size and function. EF 55-60%. Grade 1 diastolic dysfunction. Elevated LV filling pressures. No regional W. May.    Allergies  Allergen Reactions   Evista [Raloxifene]    Fosamax [Alendronate]    Penicillins    Polyoxyethylene Lauryl Ether [Sorbitan]    Moxifloxacin Rash    Allergies as of 03/12/2023       Reactions   Evista [raloxifene]    Fosamax [alendronate]    Penicillins    Polyoxyethylene  Lauryl Ether [sorbitan]    Moxifloxacin Rash        Medication List        Accurate as of March 12, 2023 10:34 AM. If you have any questions, ask your nurse or doctor.          acetaminophen 500 MG tablet Commonly known as: TYLENOL Take 1,000 mg by mouth 2 (two) times daily as needed.   Tylenol 8 Hour Arthritis Pain 650 MG CR tablet Generic drug: acetaminophen Take 650 mg by mouth 2 (two) times daily.   CALCIUM 600 PO Take 1 tablet by mouth daily.   calcium carbonate 500 MG chewable tablet Commonly known as: TUMS - dosed in mg elemental calcium Chew 1 tablet by mouth 2 (two) times daily as needed for indigestion or heartburn.   CeraVe Lotn Apply 1 application. topically daily.   diclofenac Sodium 1 % Gel Commonly known as: VOLTAREN Apply 1 g topically 3 (three) times daily.   fluticasone 50 MCG/ACT nasal spray Commonly known as: FLONASE Place 2 sprays into both nostrils daily.   memantine 10 MG tablet Commonly known as: NAMENDA Take 10 mg by mouth 2 (two) times daily.   MULTIPLE VITAMIN PO Take by mouth daily.   potassium chloride SA 20 MEQ tablet Commonly known as: KLOR-CON M Take 30 mEq by mouth once.   torsemide 20 MG tablet Commonly known as: DEMADEX Take 40 mg by mouth daily.        Review of Systems  Unable to perform ROS: Dementia    Immunization History  Administered Date(s) Administered   Fluad Quad(high Dose 65+) 07/08/2022   Influenza, Quadrivalent, Recombinant, Inj, Pf 06/18/2018, 05/04/2019, 06/15/2020   Influenza-Unspecified 09/15/2019, 07/08/2021   Moderna Sars-Covid-2 Vaccination 09/15/2019   PFIZER(Purple Top)SARS-COV-2 Vaccination 10/03/2019, 10/22/2019, 09/03/2020   Pfizer Covid-19 Vaccine Bivalent Booster 29yrs & up 06/04/2021   Pneumococcal Conjugate-13 05/28/2014   Pneumococcal Polysaccharide-23 07/15/1998, 06/15/2005   Tdap 05/26/2016   Unspecified SARS-COV-2 Vaccination 03/03/2022   Zoster, Live 05/18/2013, 05/28/2014    Pertinent  Health Maintenance Due  Topic Date Due   DEXA SCAN  11/05/2023 (Originally 03/06/1998)   INFLUENZA VACCINE  04/15/2023      11/03/2018    1:24 PM 10/23/2019    1:44 PM 05/14/2021    1:40 PM 05/22/2022    2:37 PM 07/23/2022    2:47 PM  Fall Risk  Falls in the past year?  0 1 0 0  Was there an injury with Fall?   0 0 0  Fall Risk Category Calculator   1 0 0  Fall Risk Category (Retired)   Low Low Low  (RETIRED) Patient Fall Risk Level Low fall risk  Moderate fall risk Low fall risk Low fall risk  Patient at Risk for Falls Due to   History of fall(s);Impaired balance/gait History of fall(s) No Fall Risks  Fall risk Follow  up   Falls evaluation completed;Education provided;Falls prevention discussed Falls evaluation completed;Education provided;Falls prevention discussed Falls evaluation completed   Functional Status Survey:    Vitals:   03/12/23 1019  BP: (!) 101/52  Pulse: 72  Resp: 16  Temp: 98.1 F (36.7 C)  SpO2: 96%  Weight: 155 lb 3.2 oz (70.4 kg)  Height: 5\' 5"  (1.651 m)   Body mass index is 25.83 kg/m. Physical Exam Vitals reviewed.  Constitutional:      General: She is not in acute distress. HENT:     Head: Normocephalic.  Eyes:     General:        Right eye: No discharge.        Left eye: No discharge.  Cardiovascular:     Rate and Rhythm: Normal rate and regular rhythm.     Pulses: Normal pulses.     Heart sounds: Normal heart sounds.  Pulmonary:     Effort: Pulmonary effort is normal. No respiratory distress.     Breath sounds: Normal breath sounds. No wheezing or rales.  Musculoskeletal:     Cervical back: Neck supple.     Right lower leg: Edema present.     Left lower leg: Edema present.     Comments: Non pitting  Skin:    General: Skin is warm.     Capillary Refill: Capillary refill takes less than 2 seconds.     Findings: No lesion.     Comments: Scant erythema to BLE> improved, no open lesions   Neurological:     General: No focal  deficit present.     Mental Status: She is alert.  Psychiatric:        Mood and Affect: Mood normal.     Labs reviewed: Recent Labs    02/11/23 0000 02/18/23 0000 03/12/23 0000  NA 136* 136* 140  K 4.1 4.2 4.1  CL 106 103 107  CO2 24* 24* 25*  BUN 28* 27* 29*  CREATININE 0.9 1.0 1.2*  CALCIUM 9.4 9.7 8.9   Recent Labs    04/23/22 0000 11/05/22 0000 02/11/23 0000  AST 15 13 15   ALT 10 14 13   ALKPHOS  --  98 106  ALBUMIN 3.3* 3.3* 3.7   Recent Labs    04/23/22 0000 11/05/22 0000 02/11/23 0000  WBC 4.6 5.0 5.9  NEUTROABS 2,277.00 2,615.00 3,404.00  HGB 11.6* 11.2* 10.6*  HCT 35* 33* 32*  PLT 203 243  --    Lab Results  Component Value Date   TSH 4.09 10/21/2021   No results found for: "HGBA1C" Lab Results  Component Value Date   CHOL 156 08/27/2020   HDL 69 08/27/2020   LDLCALC 75 08/27/2020   TRIG 62 08/27/2020   CHOLHDL 2.8 02/15/2010    Significant Diagnostic Results in last 30 days:  No results found.  Assessment/Plan 1. Venous insufficiency - painful varicose veins, restless legs, heaviness - improved edema and erythema to BLE - BUN/creat 29/1.28 03/11/2023 - recheck bmp in 2 weeks - cont torsemide 20 mg daily   Family/ staff Communication: plan discussed with patient and nurse  Labs/tests ordered:   bmp in 2 weeks

## 2023-03-16 DIAGNOSIS — G309 Alzheimer's disease, unspecified: Secondary | ICD-10-CM | POA: Diagnosis not present

## 2023-03-16 DIAGNOSIS — R41841 Cognitive communication deficit: Secondary | ICD-10-CM | POA: Diagnosis not present

## 2023-03-16 DIAGNOSIS — H9190 Unspecified hearing loss, unspecified ear: Secondary | ICD-10-CM | POA: Diagnosis not present

## 2023-03-16 DIAGNOSIS — R2681 Unsteadiness on feet: Secondary | ICD-10-CM | POA: Diagnosis not present

## 2023-03-18 DIAGNOSIS — R2681 Unsteadiness on feet: Secondary | ICD-10-CM | POA: Diagnosis not present

## 2023-03-18 DIAGNOSIS — H9190 Unspecified hearing loss, unspecified ear: Secondary | ICD-10-CM | POA: Diagnosis not present

## 2023-03-18 DIAGNOSIS — G309 Alzheimer's disease, unspecified: Secondary | ICD-10-CM | POA: Diagnosis not present

## 2023-03-18 DIAGNOSIS — R41841 Cognitive communication deficit: Secondary | ICD-10-CM | POA: Diagnosis not present

## 2023-03-22 DIAGNOSIS — R2681 Unsteadiness on feet: Secondary | ICD-10-CM | POA: Diagnosis not present

## 2023-03-22 DIAGNOSIS — R41841 Cognitive communication deficit: Secondary | ICD-10-CM | POA: Diagnosis not present

## 2023-03-22 DIAGNOSIS — G309 Alzheimer's disease, unspecified: Secondary | ICD-10-CM | POA: Diagnosis not present

## 2023-03-22 DIAGNOSIS — H9190 Unspecified hearing loss, unspecified ear: Secondary | ICD-10-CM | POA: Diagnosis not present

## 2023-03-24 DIAGNOSIS — R2681 Unsteadiness on feet: Secondary | ICD-10-CM | POA: Diagnosis not present

## 2023-03-24 DIAGNOSIS — R41841 Cognitive communication deficit: Secondary | ICD-10-CM | POA: Diagnosis not present

## 2023-03-24 DIAGNOSIS — G309 Alzheimer's disease, unspecified: Secondary | ICD-10-CM | POA: Diagnosis not present

## 2023-03-24 DIAGNOSIS — H9190 Unspecified hearing loss, unspecified ear: Secondary | ICD-10-CM | POA: Diagnosis not present

## 2023-03-25 DIAGNOSIS — H9190 Unspecified hearing loss, unspecified ear: Secondary | ICD-10-CM | POA: Diagnosis not present

## 2023-03-25 DIAGNOSIS — G309 Alzheimer's disease, unspecified: Secondary | ICD-10-CM | POA: Diagnosis not present

## 2023-03-25 DIAGNOSIS — I10 Essential (primary) hypertension: Secondary | ICD-10-CM | POA: Diagnosis not present

## 2023-03-25 DIAGNOSIS — R2681 Unsteadiness on feet: Secondary | ICD-10-CM | POA: Diagnosis not present

## 2023-03-25 DIAGNOSIS — R41841 Cognitive communication deficit: Secondary | ICD-10-CM | POA: Diagnosis not present

## 2023-03-30 DIAGNOSIS — G309 Alzheimer's disease, unspecified: Secondary | ICD-10-CM | POA: Diagnosis not present

## 2023-03-30 DIAGNOSIS — R41841 Cognitive communication deficit: Secondary | ICD-10-CM | POA: Diagnosis not present

## 2023-03-30 DIAGNOSIS — R2681 Unsteadiness on feet: Secondary | ICD-10-CM | POA: Diagnosis not present

## 2023-03-30 DIAGNOSIS — H9190 Unspecified hearing loss, unspecified ear: Secondary | ICD-10-CM | POA: Diagnosis not present

## 2023-03-31 DIAGNOSIS — G309 Alzheimer's disease, unspecified: Secondary | ICD-10-CM | POA: Diagnosis not present

## 2023-03-31 DIAGNOSIS — R41841 Cognitive communication deficit: Secondary | ICD-10-CM | POA: Diagnosis not present

## 2023-03-31 DIAGNOSIS — R2681 Unsteadiness on feet: Secondary | ICD-10-CM | POA: Diagnosis not present

## 2023-03-31 DIAGNOSIS — H9190 Unspecified hearing loss, unspecified ear: Secondary | ICD-10-CM | POA: Diagnosis not present

## 2023-04-06 DIAGNOSIS — R2681 Unsteadiness on feet: Secondary | ICD-10-CM | POA: Diagnosis not present

## 2023-04-06 DIAGNOSIS — G309 Alzheimer's disease, unspecified: Secondary | ICD-10-CM | POA: Diagnosis not present

## 2023-04-06 DIAGNOSIS — H9190 Unspecified hearing loss, unspecified ear: Secondary | ICD-10-CM | POA: Diagnosis not present

## 2023-04-06 DIAGNOSIS — R41841 Cognitive communication deficit: Secondary | ICD-10-CM | POA: Diagnosis not present

## 2023-04-07 DIAGNOSIS — R2681 Unsteadiness on feet: Secondary | ICD-10-CM | POA: Diagnosis not present

## 2023-04-07 DIAGNOSIS — G309 Alzheimer's disease, unspecified: Secondary | ICD-10-CM | POA: Diagnosis not present

## 2023-04-07 DIAGNOSIS — R41841 Cognitive communication deficit: Secondary | ICD-10-CM | POA: Diagnosis not present

## 2023-04-07 DIAGNOSIS — H9190 Unspecified hearing loss, unspecified ear: Secondary | ICD-10-CM | POA: Diagnosis not present

## 2023-04-13 DIAGNOSIS — G309 Alzheimer's disease, unspecified: Secondary | ICD-10-CM | POA: Diagnosis not present

## 2023-04-13 DIAGNOSIS — H9190 Unspecified hearing loss, unspecified ear: Secondary | ICD-10-CM | POA: Diagnosis not present

## 2023-04-13 DIAGNOSIS — R2681 Unsteadiness on feet: Secondary | ICD-10-CM | POA: Diagnosis not present

## 2023-04-13 DIAGNOSIS — R41841 Cognitive communication deficit: Secondary | ICD-10-CM | POA: Diagnosis not present

## 2023-04-14 DIAGNOSIS — R41841 Cognitive communication deficit: Secondary | ICD-10-CM | POA: Diagnosis not present

## 2023-04-14 DIAGNOSIS — H9190 Unspecified hearing loss, unspecified ear: Secondary | ICD-10-CM | POA: Diagnosis not present

## 2023-04-14 DIAGNOSIS — R2681 Unsteadiness on feet: Secondary | ICD-10-CM | POA: Diagnosis not present

## 2023-04-14 DIAGNOSIS — G309 Alzheimer's disease, unspecified: Secondary | ICD-10-CM | POA: Diagnosis not present

## 2023-04-16 ENCOUNTER — Encounter: Payer: Self-pay | Admitting: Orthopedic Surgery

## 2023-04-16 ENCOUNTER — Non-Acute Institutional Stay: Payer: Medicare Other | Admitting: Orthopedic Surgery

## 2023-04-16 DIAGNOSIS — F0150 Vascular dementia without behavioral disturbance: Secondary | ICD-10-CM | POA: Diagnosis not present

## 2023-04-16 DIAGNOSIS — M546 Pain in thoracic spine: Secondary | ICD-10-CM

## 2023-04-16 DIAGNOSIS — G8929 Other chronic pain: Secondary | ICD-10-CM

## 2023-04-16 DIAGNOSIS — J3089 Other allergic rhinitis: Secondary | ICD-10-CM

## 2023-04-16 DIAGNOSIS — I872 Venous insufficiency (chronic) (peripheral): Secondary | ICD-10-CM

## 2023-04-16 DIAGNOSIS — I959 Hypotension, unspecified: Secondary | ICD-10-CM

## 2023-04-16 NOTE — Progress Notes (Signed)
Location:  [facility] Room Number: 6/A Place of Service:  ALF [phone]) Provider:  Octavia Heir, NP   Mahlon Gammon, MD  Patient Care Team: Mahlon Gammon, MD as PCP - General (Internal Medicine) Pa, [facility] (Ophthalmology) Dermatology, [facility] (Dermatology)  Extended Emergency Contact Information Primary Emergency Contact: Jarrett Ables [place] of [place] Home Phone: [phone] Mobile Phone: [phone] Relation: Daughter  Code Status:  DNR Goals of care: Advanced Directive information    03/12/2023   10:33 AM  Advanced Directives  Does Patient Have a Medical Advance Directive? Yes  Type of Estate agent of [place];Out of facility DNR (pink MOST or yellow form)  Does patient want to make changes to medical advance directive? No - Patient declined  Copy of Healthcare Power of Attorney in Chart? Yes - validated most recent copy scanned in chart (See row information)     No chief complaint on file.   HPI:  Pt is a 89 y.o. female seen today for medical management of chronic diseases.    She continues to reside in assisted living at [facility]. PMH: HTN, HLD, venous insufficiency, GERD, dementia, right hip surgery, and diverticulitis.   Venous insufficiency- onset x 2 months, remains on torsemide and KCL Low blood pressure- not on medications, see below Vascular dementia- MMSE 24/30 (04/07/2023)> was 22/30 2022, MRI brain 2022 indicated mild age related cerebral atrophy with chronic small vessel disease, no behaviors, needs cueing in evening to complete ADLs, remains on Namenda  Chronic back pain- remains on scheduled tylenol and voltaren gel Allergies- remains on Flonase  Recent blood pressures:  07/31- 112/68  07/24- 96/56  07/17- 99/60  Recent weights:  08/01- 148.6 lbs  07/01- 150 lbs  06/03- 155.2 lbs      Past Medical History:  Diagnosis Date   Anxiety    Cataract    Chest pain  02/16/2010   Myoview, negative for pharmacologic-stress induced ischemia, EF 74   Complication of anesthesia 1954   hard to wake up   Diverticulosis    DUB (dysfunctional uterine bleeding)    Dysrhythmia    h/o PVCs- none recently- no meds   Fall    GERD (gastroesophageal reflux disease)    Hearing reduced    Hypertension    no meds currently   Loss of consciousness (HCC)    Memory loss    Osteoarthritis of left hip    Osteoporosis    Palpitation 02/02/2013   A CardioNet MCOT (mobile cardiac outpatient telemetry ): NSR with PACs; sinus tachycardia with ventricular trigeminy. Brief atrial run. Facet cardiac; ventricular trigeminy and accelerated idioventricular rhythm. No significant tachyarrhythmias.   Palpitations    Postmenopausal bleeding 11/01/2014   Unsteady gait    Varicose veins of both legs with pain    Past Surgical History:  Procedure Laterality Date   APPENDECTOMY     CATARACT EXTRACTION     both eyes   CESAREAN SECTION  1962, 1963, 7564,3329   x 4   COLONOSCOPY  01/14/2017   DIAGNOSTIC LAPAROSCOPY     ECTOPIC PREGNANCY SURGERY  1966   FRACTURE SURGERY     HIP SURGERY Right    HYSTEROSCOPY WITH D & C N/A 11/02/2014   Procedure: DILATATION AND CURETTAGE /HYSTEROSCOPY;  Surgeon: Sherian Rein, MD;  Location: [facility];  Service: Gynecology;  Laterality: N/A;   Lower Extremity Venous Dopplers  01/2013   No thrombus or thrombophlebitis. R Greater Saphenous Vein -  enlarged without other insufficiency (6 mm); right and left Small Saphenous Vein: Patent no valve insufficiency.   TONSILLECTOMY     TONSILLECTOMY AND ADENOIDECTOMY     TRANSTHORACIC ECHOCARDIOGRAM  01/2013   Normal LV size and function. EF 55-60%. Grade 1 diastolic dysfunction. Elevated LV filling pressures. No regional W. May.    Allergies  Allergen Reactions   Evista [Raloxifene]    Fosamax [Alendronate]    Penicillins    Polyoxyethylene Lauryl Ether [Sorbitan]    Moxifloxacin Rash     Outpatient Encounter Medications as of 04/16/2023  Medication Sig   acetaminophen (TYLENOL 8 HOUR ARTHRITIS PAIN) 650 MG CR tablet Take 650 mg by mouth 2 (two) times daily.   acetaminophen (TYLENOL) 500 MG tablet Take 1,000 mg by mouth 2 (two) times daily as needed.   Calcium Carbonate (CALCIUM 600 PO) Take 1 tablet by mouth daily.   calcium carbonate (TUMS - DOSED IN MG ELEMENTAL CALCIUM) 500 MG chewable tablet Chew 1 tablet by mouth 2 (two) times daily as needed for indigestion or heartburn.   diclofenac Sodium (VOLTAREN) 1 % GEL Apply 1 g topically 3 (three) times daily.   Emollient (CERAVE) LOTN Apply 1 application. topically daily.   fluticasone (FLONASE) 50 MCG/ACT nasal spray Place 2 sprays into both nostrils daily.   memantine (NAMENDA) 10 MG tablet Take 10 mg by mouth 2 (two) times daily.   MULTIPLE VITAMIN PO Take by mouth daily.   potassium chloride SA (KLOR-CON M) 20 MEQ tablet Take 30 mEq by mouth once.   torsemide (DEMADEX) 20 MG tablet Take 40 mg by mouth daily.   No facility-administered encounter medications on file as of 04/16/2023.    Review of Systems  Unable to perform ROS: Dementia    Immunization History  Administered Date(s) Administered   Fluad Quad(high Dose 65+) 07/08/2022   Influenza, Quadrivalent, Recombinant, Inj, Pf 06/18/2018, 05/04/2019, 06/15/2020   Influenza-Unspecified 09/15/2019, 07/08/2021   Moderna Sars-Covid-2 Vaccination 09/15/2019   PFIZER(Purple Top)SARS-COV-2 Vaccination 10/03/2019, 10/22/2019, 09/03/2020   Pfizer Covid-19 Vaccine Bivalent Booster 26yrs & up 06/04/2021   Pneumococcal Conjugate-13 05/28/2014   Pneumococcal Polysaccharide-23 07/15/1998, 06/15/2005   Tdap 05/26/2016   Unspecified SARS-COV-2 Vaccination 03/03/2022   Zoster, Live 05/18/2013, 05/28/2014   Pertinent  Health Maintenance Due  Topic Date Due   INFLUENZA VACCINE  04/15/2023   DEXA SCAN  11/05/2023 (Originally 03/06/1998)      11/03/2018    1:24 PM 10/23/2019     1:44 PM 05/14/2021    1:40 PM 05/22/2022    2:37 PM 07/23/2022    2:47 PM  Fall Risk  Falls in the past year?  0 1 0 0  Was there an injury with Fall?   0 0 0  Fall Risk Category Calculator   1 0 0  Fall Risk Category (Retired)   Low Low Low  (RETIRED) Patient Fall Risk Level Low fall risk  Moderate fall risk Low fall risk Low fall risk  Patient at Risk for Falls Due to   History of fall(s);Impaired balance/gait History of fall(s) No Fall Risks  Fall risk Follow up   Falls evaluation completed;Education provided;Falls prevention discussed Falls evaluation completed;Education provided;Falls prevention discussed Falls evaluation completed   Functional Status Survey:    Vitals:   04/16/23 1223  BP: 112/68  Pulse: 85  Resp: 20  Temp: 97.6 F (36.4 C)  SpO2: 99%  Weight: 148 lb 9.6 oz (67.4 kg)  Height: 5\' 5"  (1.651 m)   Body mass  index is 24.73 kg/m. Physical Exam Vitals reviewed.  Constitutional:      General: She is not in acute distress. HENT:     Head: Normocephalic.     Right Ear: There is no impacted cerumen.     Left Ear: There is no impacted cerumen.     Nose: Nose normal.     Mouth/Throat:     Mouth: Mucous membranes are moist.  Eyes:     General:        Right eye: No discharge.        Left eye: No discharge.  Cardiovascular:     Rate and Rhythm: Normal rate and regular rhythm.     Pulses: Normal pulses.     Heart sounds: Normal heart sounds.  Pulmonary:     Effort: Pulmonary effort is normal. No respiratory distress.     Breath sounds: Normal breath sounds. No wheezing.  Abdominal:     General: Bowel sounds are normal.     Palpations: Abdomen is soft.  Musculoskeletal:     Cervical back: Neck supple.     Right lower leg: Edema present.     Left lower leg: Edema present.     Comments: Non pitting  Skin:    General: Skin is warm.     Capillary Refill: Capillary refill takes less than 2 seconds.  Neurological:     General: No focal deficit present.      Mental Status: She is alert. Mental status is at baseline.     Motor: Weakness present.     Gait: Gait abnormal.     Comments: walker  Psychiatric:        Mood and Affect: Mood normal.     Labs reviewed: Recent Labs    02/11/23 0000 02/18/23 0000 03/12/23 0000  NA 136* 136* 140  K 4.1 4.2 4.1  CL 106 103 107  CO2 24* 24* 25*  BUN 28* 27* 29*  CREATININE 0.9 1.0 1.2*  CALCIUM 9.4 9.7 8.9   Recent Labs    04/23/22 0000 11/05/22 0000 02/11/23 0000  AST 15 13 15   ALT 10 14 13   ALKPHOS  --  98 106  ALBUMIN 3.3* 3.3* 3.7   Recent Labs    04/23/22 0000 11/05/22 0000 02/11/23 0000  WBC 4.6 5.0 5.9  NEUTROABS 2,277.00 2,615.00 3,404.00  HGB 11.6* 11.2* 10.6*  HCT 35* 33* 32*  PLT 203 243  --    Lab Results  Component Value Date   TSH 4.09 10/21/2021   No results found for: "HGBA1C" Lab Results  Component Value Date   CHOL 156 08/27/2020   HDL 69 08/27/2020   LDLCALC 75 08/27/2020   TRIG 62 08/27/2020   CHOLHDL 2.8 02/15/2010    Significant Diagnostic Results in last 30 days:  No results found.  Assessment/Plan 1. Venous insufficiency - painful varicose veins, restless legs, heaviness - onset x 2 months - non pitting edema - cont torsemide and KCL  2. Hypotension, unspecified hypotension type - SBP < 100 per chart review - started on torsemide  - blood pressures BID x 7 days  3. Vascular dementia without behavioral disturbance (HCC) - MMSE 24/30 - no behaviors - not doing ADLs in evening- needing more cues - cont Namenda   4. Chronic midline thoracic back pain - ongoing - cont tylenol and voltaren gel  5. Environmental and seasonal allergies - cont Flonase    Family/ staff Communication: plan discussed with patient and nurse  Labs/tests  ordered:  none

## 2023-04-20 DIAGNOSIS — R41841 Cognitive communication deficit: Secondary | ICD-10-CM | POA: Diagnosis not present

## 2023-04-20 DIAGNOSIS — H9190 Unspecified hearing loss, unspecified ear: Secondary | ICD-10-CM | POA: Diagnosis not present

## 2023-04-20 DIAGNOSIS — R2681 Unsteadiness on feet: Secondary | ICD-10-CM | POA: Diagnosis not present

## 2023-04-20 DIAGNOSIS — G309 Alzheimer's disease, unspecified: Secondary | ICD-10-CM | POA: Diagnosis not present

## 2023-04-21 DIAGNOSIS — G309 Alzheimer's disease, unspecified: Secondary | ICD-10-CM | POA: Diagnosis not present

## 2023-04-21 DIAGNOSIS — R41841 Cognitive communication deficit: Secondary | ICD-10-CM | POA: Diagnosis not present

## 2023-04-21 DIAGNOSIS — H9190 Unspecified hearing loss, unspecified ear: Secondary | ICD-10-CM | POA: Diagnosis not present

## 2023-04-21 DIAGNOSIS — R2681 Unsteadiness on feet: Secondary | ICD-10-CM | POA: Diagnosis not present

## 2023-04-23 DIAGNOSIS — R2681 Unsteadiness on feet: Secondary | ICD-10-CM | POA: Diagnosis not present

## 2023-04-23 DIAGNOSIS — G309 Alzheimer's disease, unspecified: Secondary | ICD-10-CM | POA: Diagnosis not present

## 2023-04-23 DIAGNOSIS — H9190 Unspecified hearing loss, unspecified ear: Secondary | ICD-10-CM | POA: Diagnosis not present

## 2023-04-23 DIAGNOSIS — R41841 Cognitive communication deficit: Secondary | ICD-10-CM | POA: Diagnosis not present

## 2023-04-27 DIAGNOSIS — R41841 Cognitive communication deficit: Secondary | ICD-10-CM | POA: Diagnosis not present

## 2023-04-27 DIAGNOSIS — G309 Alzheimer's disease, unspecified: Secondary | ICD-10-CM | POA: Diagnosis not present

## 2023-04-27 DIAGNOSIS — H9190 Unspecified hearing loss, unspecified ear: Secondary | ICD-10-CM | POA: Diagnosis not present

## 2023-04-27 DIAGNOSIS — R2681 Unsteadiness on feet: Secondary | ICD-10-CM | POA: Diagnosis not present

## 2023-04-28 DIAGNOSIS — R2681 Unsteadiness on feet: Secondary | ICD-10-CM | POA: Diagnosis not present

## 2023-04-28 DIAGNOSIS — R41841 Cognitive communication deficit: Secondary | ICD-10-CM | POA: Diagnosis not present

## 2023-04-28 DIAGNOSIS — H9190 Unspecified hearing loss, unspecified ear: Secondary | ICD-10-CM | POA: Diagnosis not present

## 2023-04-28 DIAGNOSIS — G309 Alzheimer's disease, unspecified: Secondary | ICD-10-CM | POA: Diagnosis not present

## 2023-05-04 DIAGNOSIS — G309 Alzheimer's disease, unspecified: Secondary | ICD-10-CM | POA: Diagnosis not present

## 2023-05-04 DIAGNOSIS — H9190 Unspecified hearing loss, unspecified ear: Secondary | ICD-10-CM | POA: Diagnosis not present

## 2023-05-04 DIAGNOSIS — R41841 Cognitive communication deficit: Secondary | ICD-10-CM | POA: Diagnosis not present

## 2023-05-04 DIAGNOSIS — R2681 Unsteadiness on feet: Secondary | ICD-10-CM | POA: Diagnosis not present

## 2023-05-06 DIAGNOSIS — R2681 Unsteadiness on feet: Secondary | ICD-10-CM | POA: Diagnosis not present

## 2023-05-06 DIAGNOSIS — G309 Alzheimer's disease, unspecified: Secondary | ICD-10-CM | POA: Diagnosis not present

## 2023-05-06 DIAGNOSIS — H9190 Unspecified hearing loss, unspecified ear: Secondary | ICD-10-CM | POA: Diagnosis not present

## 2023-05-06 DIAGNOSIS — R41841 Cognitive communication deficit: Secondary | ICD-10-CM | POA: Diagnosis not present

## 2023-05-12 DIAGNOSIS — H9190 Unspecified hearing loss, unspecified ear: Secondary | ICD-10-CM | POA: Diagnosis not present

## 2023-05-12 DIAGNOSIS — R41841 Cognitive communication deficit: Secondary | ICD-10-CM | POA: Diagnosis not present

## 2023-05-12 DIAGNOSIS — R2681 Unsteadiness on feet: Secondary | ICD-10-CM | POA: Diagnosis not present

## 2023-05-12 DIAGNOSIS — G309 Alzheimer's disease, unspecified: Secondary | ICD-10-CM | POA: Diagnosis not present

## 2023-05-13 DIAGNOSIS — H9190 Unspecified hearing loss, unspecified ear: Secondary | ICD-10-CM | POA: Diagnosis not present

## 2023-05-13 DIAGNOSIS — G309 Alzheimer's disease, unspecified: Secondary | ICD-10-CM | POA: Diagnosis not present

## 2023-05-13 DIAGNOSIS — R2681 Unsteadiness on feet: Secondary | ICD-10-CM | POA: Diagnosis not present

## 2023-05-13 DIAGNOSIS — R41841 Cognitive communication deficit: Secondary | ICD-10-CM | POA: Diagnosis not present

## 2023-05-14 DIAGNOSIS — G309 Alzheimer's disease, unspecified: Secondary | ICD-10-CM | POA: Diagnosis not present

## 2023-05-14 DIAGNOSIS — H9190 Unspecified hearing loss, unspecified ear: Secondary | ICD-10-CM | POA: Diagnosis not present

## 2023-05-14 DIAGNOSIS — R41841 Cognitive communication deficit: Secondary | ICD-10-CM | POA: Diagnosis not present

## 2023-05-14 DIAGNOSIS — R2681 Unsteadiness on feet: Secondary | ICD-10-CM | POA: Diagnosis not present

## 2023-05-19 DIAGNOSIS — R4701 Aphasia: Secondary | ICD-10-CM | POA: Diagnosis not present

## 2023-05-19 DIAGNOSIS — R41841 Cognitive communication deficit: Secondary | ICD-10-CM | POA: Diagnosis not present

## 2023-05-19 DIAGNOSIS — G309 Alzheimer's disease, unspecified: Secondary | ICD-10-CM | POA: Diagnosis not present

## 2023-05-19 DIAGNOSIS — R2681 Unsteadiness on feet: Secondary | ICD-10-CM | POA: Diagnosis not present

## 2023-05-19 DIAGNOSIS — R278 Other lack of coordination: Secondary | ICD-10-CM | POA: Diagnosis not present

## 2023-05-19 DIAGNOSIS — M6281 Muscle weakness (generalized): Secondary | ICD-10-CM | POA: Diagnosis not present

## 2023-05-19 DIAGNOSIS — H9190 Unspecified hearing loss, unspecified ear: Secondary | ICD-10-CM | POA: Diagnosis not present

## 2023-05-20 DIAGNOSIS — H9190 Unspecified hearing loss, unspecified ear: Secondary | ICD-10-CM | POA: Diagnosis not present

## 2023-05-20 DIAGNOSIS — R4701 Aphasia: Secondary | ICD-10-CM | POA: Diagnosis not present

## 2023-05-20 DIAGNOSIS — G309 Alzheimer's disease, unspecified: Secondary | ICD-10-CM | POA: Diagnosis not present

## 2023-05-20 DIAGNOSIS — R278 Other lack of coordination: Secondary | ICD-10-CM | POA: Diagnosis not present

## 2023-05-20 DIAGNOSIS — R2681 Unsteadiness on feet: Secondary | ICD-10-CM | POA: Diagnosis not present

## 2023-05-20 DIAGNOSIS — M6281 Muscle weakness (generalized): Secondary | ICD-10-CM | POA: Diagnosis not present

## 2023-05-25 DIAGNOSIS — R2681 Unsteadiness on feet: Secondary | ICD-10-CM | POA: Diagnosis not present

## 2023-05-25 DIAGNOSIS — G309 Alzheimer's disease, unspecified: Secondary | ICD-10-CM | POA: Diagnosis not present

## 2023-05-25 DIAGNOSIS — R278 Other lack of coordination: Secondary | ICD-10-CM | POA: Diagnosis not present

## 2023-05-25 DIAGNOSIS — H9190 Unspecified hearing loss, unspecified ear: Secondary | ICD-10-CM | POA: Diagnosis not present

## 2023-05-25 DIAGNOSIS — R4701 Aphasia: Secondary | ICD-10-CM | POA: Diagnosis not present

## 2023-05-25 DIAGNOSIS — M6281 Muscle weakness (generalized): Secondary | ICD-10-CM | POA: Diagnosis not present

## 2023-05-26 ENCOUNTER — Encounter: Payer: Self-pay | Admitting: Orthopedic Surgery

## 2023-05-26 ENCOUNTER — Non-Acute Institutional Stay: Payer: Medicare Other | Admitting: Orthopedic Surgery

## 2023-05-26 DIAGNOSIS — I872 Venous insufficiency (chronic) (peripheral): Secondary | ICD-10-CM | POA: Diagnosis not present

## 2023-05-26 DIAGNOSIS — M25552 Pain in left hip: Secondary | ICD-10-CM | POA: Diagnosis not present

## 2023-05-26 DIAGNOSIS — R4701 Aphasia: Secondary | ICD-10-CM | POA: Diagnosis not present

## 2023-05-26 DIAGNOSIS — G309 Alzheimer's disease, unspecified: Secondary | ICD-10-CM | POA: Diagnosis not present

## 2023-05-26 DIAGNOSIS — R278 Other lack of coordination: Secondary | ICD-10-CM | POA: Diagnosis not present

## 2023-05-26 DIAGNOSIS — H9190 Unspecified hearing loss, unspecified ear: Secondary | ICD-10-CM | POA: Diagnosis not present

## 2023-05-26 DIAGNOSIS — S0083XA Contusion of other part of head, initial encounter: Secondary | ICD-10-CM

## 2023-05-26 DIAGNOSIS — F0390 Unspecified dementia without behavioral disturbance: Secondary | ICD-10-CM | POA: Diagnosis not present

## 2023-05-26 DIAGNOSIS — R2681 Unsteadiness on feet: Secondary | ICD-10-CM | POA: Diagnosis not present

## 2023-05-26 DIAGNOSIS — W19XXXA Unspecified fall, initial encounter: Secondary | ICD-10-CM | POA: Diagnosis not present

## 2023-05-26 DIAGNOSIS — M6281 Muscle weakness (generalized): Secondary | ICD-10-CM | POA: Diagnosis not present

## 2023-05-26 NOTE — Progress Notes (Signed)
Location:  [facility] Room Number: 6/A Place of Service:  ALF [phone]) Provider:  Octavia Heir, NP   Mahlon Gammon, MD  Patient Care Team: Mahlon Gammon, MD as PCP - General (Internal Medicine) Pa, [facility] (Ophthalmology) Dermatology, [facility] (Dermatology)  Extended Emergency Contact Information Primary Emergency Contact: Jarrett Ables [place] of [place] Home Phone: [phone] Mobile Phone: [phone] Relation: Daughter  Code Status:  DNR Goals of care: Advanced Directive information    03/12/2023   10:33 AM  Advanced Directives  Does Patient Have a Medical Advance Directive? Yes  Type of Estate agent of [place];Out of facility DNR (pink MOST or yellow form)  Does patient want to make changes to medical advance directive? No - Patient declined  Copy of Healthcare Power of Attorney in Chart? Yes - validated most recent copy scanned in chart (See row information)     Chief Complaint  Patient presents with   Acute Visit    HPI:  Pt is a 89 y.o. female seen today for medical management of chronic diseases.    She continues to reside in assisted living at [facility]. PMH: HTN, HLD, venous insufficiency, GERD, dementia, right hip surgery, and diverticulitis.   This morning she was found on the floor by nursing. She does not recall falling. She did c/o right sided hip pain to nursing She had bruise to left cheek when evaluated. Poor historian due to AD. She denies right hip pain during our encounter. She points and grabs to left hip and leg. Able to stand and take a few steps without difficulty. Able to move extremities without difficulty. She is not on anticoagulant. Afebrile. Vitals stable.    Past Medical History:  Diagnosis Date   Anxiety    Cataract    Chest pain 02/16/2010   Myoview, negative for pharmacologic-stress induced ischemia, EF 74   Complication of anesthesia 1954   hard to wake up    Diverticulosis    DUB (dysfunctional uterine bleeding)    Dysrhythmia    h/o PVCs- none recently- no meds   Fall    GERD (gastroesophageal reflux disease)    Hearing reduced    Hypertension    no meds currently   Loss of consciousness (HCC)    Memory loss    Osteoarthritis of left hip    Osteoporosis    Palpitation 02/02/2013   A CardioNet MCOT (mobile cardiac outpatient telemetry ): NSR with PACs; sinus tachycardia with ventricular trigeminy. Brief atrial run. Facet cardiac; ventricular trigeminy and accelerated idioventricular rhythm. No significant tachyarrhythmias.   Palpitations    Postmenopausal bleeding 11/01/2014   Unsteady gait    Varicose veins of both legs with pain    Past Surgical History:  Procedure Laterality Date   APPENDECTOMY     CATARACT EXTRACTION     both eyes   CESAREAN SECTION  1962, 1963, [id number]   x 4   COLONOSCOPY  01/14/2017   DIAGNOSTIC LAPAROSCOPY     ECTOPIC PREGNANCY SURGERY  1966   FRACTURE SURGERY     HIP SURGERY Right    HYSTEROSCOPY WITH D & C N/A 11/02/2014   Procedure: DILATATION AND CURETTAGE /HYSTEROSCOPY;  Surgeon: Sherian Rein, MD;  Location: [facility];  Service: Gynecology;  Laterality: N/A;   Lower Extremity Venous Dopplers  01/2013   No thrombus or thrombophlebitis. R Greater Saphenous Vein - enlarged without other insufficiency (6 mm); right and left Small Saphenous Vein: Patent  no valve insufficiency.   TONSILLECTOMY     TONSILLECTOMY AND ADENOIDECTOMY     TRANSTHORACIC ECHOCARDIOGRAM  01/2013   Normal LV size and function. EF 55-60%. Grade 1 diastolic dysfunction. Elevated LV filling pressures. No regional W. May.    Allergies  Allergen Reactions   Evista [Raloxifene]    Fosamax [Alendronate]    Penicillins    Polyoxyethylene Lauryl Ether [Sorbitan]    Moxifloxacin Rash    Outpatient Encounter Medications as of 05/26/2023  Medication Sig   acetaminophen (TYLENOL 8 HOUR ARTHRITIS PAIN) 650 MG CR tablet Take 650  mg by mouth 2 (two) times daily.   acetaminophen (TYLENOL) 500 MG tablet Take 1,000 mg by mouth 2 (two) times daily as needed.   Calcium Carbonate (CALCIUM 600 PO) Take 1 tablet by mouth daily.   calcium carbonate (TUMS - DOSED IN MG ELEMENTAL CALCIUM) 500 MG chewable tablet Chew 1 tablet by mouth 2 (two) times daily as needed for indigestion or heartburn.   diclofenac Sodium (VOLTAREN) 1 % GEL Apply 1 g topically 3 (three) times daily.   Emollient (CERAVE) LOTN Apply 1 application. topically daily.   fluticasone (FLONASE) 50 MCG/ACT nasal spray Place 2 sprays into both nostrils daily.   memantine (NAMENDA) 10 MG tablet Take 10 mg by mouth 2 (two) times daily.   MULTIPLE VITAMIN PO Take by mouth daily.   potassium chloride SA (KLOR-CON M) 20 MEQ tablet Take 30 mEq by mouth once.   torsemide (DEMADEX) 20 MG tablet Take 40 mg by mouth daily.   No facility-administered encounter medications on file as of 05/26/2023.    Review of Systems  Unable to perform ROS: Dementia    Immunization History  Administered Date(s) Administered   Fluad Quad(high Dose 65+) 07/08/2022   Influenza, Quadrivalent, Recombinant, Inj, Pf 06/18/2018, 05/04/2019, 06/15/2020   Influenza-Unspecified 09/15/2019, 07/08/2021   Moderna Sars-Covid-2 Vaccination 09/15/2019   PFIZER(Purple Top)SARS-COV-2 Vaccination 10/03/2019, 10/22/2019, 09/03/2020   Pfizer Covid-19 Vaccine Bivalent Booster 51yrs & up 06/04/2021   Pneumococcal Conjugate-13 05/28/2014   Pneumococcal Polysaccharide-23 07/15/1998, 06/15/2005   Tdap 05/26/2016   Unspecified SARS-COV-2 Vaccination 03/03/2022   Zoster, Live 05/18/2013, 05/28/2014   Pertinent  Health Maintenance Due  Topic Date Due   INFLUENZA VACCINE  04/15/2023   DEXA SCAN  11/05/2023 (Originally 03/06/1998)      11/03/2018    1:24 PM 10/23/2019    1:44 PM 05/14/2021    1:40 PM 05/22/2022    2:37 PM 07/23/2022    2:47 PM  Fall Risk  Falls in the past year?  0 1 0 0  Was there an injury  with Fall?   0 0 0  Fall Risk Category Calculator   1 0 0  Fall Risk Category (Retired)   Low Low Low  (RETIRED) Patient Fall Risk Level Low fall risk  Moderate fall risk Low fall risk Low fall risk  Patient at Risk for Falls Due to   History of fall(s);Impaired balance/gait History of fall(s) No Fall Risks  Fall risk Follow up   Falls evaluation completed;Education provided;Falls prevention discussed Falls evaluation completed;Education provided;Falls prevention discussed Falls evaluation completed   Functional Status Survey:    Vitals:   05/26/23 1253  BP: 120/67  Pulse: 73  Resp: 18  Temp: 97.7 F (36.5 C)  SpO2: 99%  Weight: 147 lb 6.4 oz (66.9 kg)  Height: 5\' 5"  (1.651 m)   Body mass index is 24.53 kg/m. Physical Exam Vitals reviewed.  Constitutional:  General: She is not in acute distress.    Comments: Faint bruise and swelling to left cheek, tenderness to touch, no pain with movement  HENT:     Head: Normocephalic.     Jaw: No tenderness, swelling or pain on movement.     Nose: Nose normal.     Mouth/Throat:     Mouth: Mucous membranes are dry.  Eyes:     General:        Right eye: No discharge.        Left eye: No discharge.  Cardiovascular:     Rate and Rhythm: Normal rate and regular rhythm.     Pulses: Normal pulses.     Heart sounds: Normal heart sounds.  Pulmonary:     Effort: Pulmonary effort is normal. No respiratory distress.     Breath sounds: Normal breath sounds. No wheezing or rales.  Abdominal:     General: Bowel sounds are normal.     Palpations: Abdomen is soft.  Musculoskeletal:     Cervical back: Neck supple.     Right lower leg: Edema present.     Left lower leg: Edema present.     Comments: 2+ pitting, tenderness during exam  Skin:    General: Skin is warm.     Capillary Refill: Capillary refill takes less than 2 seconds.  Neurological:     General: No focal deficit present.     Mental Status: She is alert. Mental status is at  baseline.     Motor: Weakness present.     Gait: Gait abnormal.     Comments: Walking with knees touching, does not separate legs  Psychiatric:        Mood and Affect: Mood normal.     Comments: Alert to self/familiar face, follows commands     Labs reviewed: Recent Labs    02/11/23 0000 02/18/23 0000 03/12/23 0000  NA 136* 136* 140  K 4.1 4.2 4.1  CL 106 103 107  CO2 24* 24* 25*  BUN 28* 27* 29*  CREATININE 0.9 1.0 1.2*  CALCIUM 9.4 9.7 8.9   Recent Labs    11/05/22 0000 02/11/23 0000  AST 13 15  ALT 14 13  ALKPHOS 98 106  ALBUMIN 3.3* 3.7   Recent Labs    11/05/22 0000 02/11/23 0000  WBC 5.0 5.9  NEUTROABS 2,615.00 3,404.00  HGB 11.2* 10.6*  HCT 33* 32*  PLT 243  --    Lab Results  Component Value Date   TSH 4.09 10/21/2021   No results found for: "HGBA1C" Lab Results  Component Value Date   CHOL 156 08/27/2020   HDL 69 08/27/2020   LDLCALC 75 08/27/2020   TRIG 62 08/27/2020   CHOLHDL 2.8 02/15/2010    Significant Diagnostic Results in last 30 days:  No results found.  Assessment/Plan: 1. Left hip pain - 09/11 found on the floor by nursing - does not recall event - WBAT, no internal/external rotation - hold off on xray for now - if pain continues recommend xray left hip/pelvis - cont tylenol   2. Facial contusion, initial encounter - see above - faint swelling and tenderness to left cheek - apply ice to left cheek TID x 3 days> if tolerated  3. Fall, initial encounter - see above - unstable gait> walking with knees touching> normal per nursing - PT evaluation  4. Venous insufficiency - painful varicose veins, restless legs, heaviness - ongoing - 2+ pitting with tenderness - start torsemide  20 mg po QAM x 2 days> give 09/11 & 09/15 - start compression stockings 09/13  5. Dementia without behavioral disturbance (HCC) - MMSE 24/30 (03/2023) & 22/30 (2022) - no behaviors - dependent with some ADLs - weights stable - cont  Namenda    Family/ staff Communication: plan discussed with patient and nurse  Labs/tests ordered:  none

## 2023-05-27 DIAGNOSIS — R278 Other lack of coordination: Secondary | ICD-10-CM | POA: Diagnosis not present

## 2023-05-27 DIAGNOSIS — R2681 Unsteadiness on feet: Secondary | ICD-10-CM | POA: Diagnosis not present

## 2023-05-27 DIAGNOSIS — H9190 Unspecified hearing loss, unspecified ear: Secondary | ICD-10-CM | POA: Diagnosis not present

## 2023-05-27 DIAGNOSIS — M6281 Muscle weakness (generalized): Secondary | ICD-10-CM | POA: Diagnosis not present

## 2023-05-27 DIAGNOSIS — G309 Alzheimer's disease, unspecified: Secondary | ICD-10-CM | POA: Diagnosis not present

## 2023-05-27 DIAGNOSIS — R4701 Aphasia: Secondary | ICD-10-CM | POA: Diagnosis not present

## 2023-05-28 ENCOUNTER — Encounter: Payer: Self-pay | Admitting: Orthopedic Surgery

## 2023-05-28 ENCOUNTER — Non-Acute Institutional Stay (INDEPENDENT_AMBULATORY_CARE_PROVIDER_SITE_OTHER): Payer: Medicare Other | Admitting: Orthopedic Surgery

## 2023-05-28 DIAGNOSIS — H9190 Unspecified hearing loss, unspecified ear: Secondary | ICD-10-CM | POA: Diagnosis not present

## 2023-05-28 DIAGNOSIS — G309 Alzheimer's disease, unspecified: Secondary | ICD-10-CM | POA: Diagnosis not present

## 2023-05-28 DIAGNOSIS — Z0000 Encounter for general adult medical examination without abnormal findings: Secondary | ICD-10-CM

## 2023-05-28 DIAGNOSIS — R2681 Unsteadiness on feet: Secondary | ICD-10-CM | POA: Diagnosis not present

## 2023-05-28 DIAGNOSIS — R4701 Aphasia: Secondary | ICD-10-CM | POA: Diagnosis not present

## 2023-05-28 DIAGNOSIS — R278 Other lack of coordination: Secondary | ICD-10-CM | POA: Diagnosis not present

## 2023-05-28 DIAGNOSIS — M6281 Muscle weakness (generalized): Secondary | ICD-10-CM | POA: Diagnosis not present

## 2023-05-28 NOTE — Progress Notes (Signed)
Subjective:   Melissa Valenzuela is a 89 y.o. female who presents for Medicare Annual (Subsequent) preventive examination.  Visit Complete: In person  Patient Medicare AWV questionnaire was completed by the patient on 05/28/2023; I have confirmed that all information answered by patient is correct and no changes since this date.  Cardiac Risk Factors include: advanced age (>33men, >51 women);hypertension;sedentary lifestyle;dyslipidemia     Objective:    Today's Vitals   05/28/23 1234  BP: 113/67  Pulse: 77  Resp: 20  Temp: 97.8 F (36.6 C)  SpO2: 98%  Weight: 147 lb 6.4 oz (66.9 kg)  Height: 5\' 5"  (1.651 m)   Body mass index is 24.53 kg/m.     03/12/2023   10:33 AM 01/14/2023   12:57 PM 11/11/2022   11:26 AM 11/04/2022   10:41 AM 04/20/2022   11:36 AM 01/22/2022   11:01 AM 10/17/2021    1:42 PM  Advanced Directives  Does Patient Have a Medical Advance Directive? Yes Yes Yes Yes Yes Yes Yes  Type of Estate agent of [place];Out of facility DNR (pink MOST or yellow form) Out of facility DNR (pink MOST or yellow form);Healthcare Power of [employer] of [place];Living will;Out of facility DNR (pink MOST or yellow form) Healthcare Power of [place];Living will;Out of facility DNR (pink MOST or yellow form) Healthcare Power of [place];Living will;Out of facility DNR (pink MOST or yellow form) Out of facility DNR (pink MOST or yellow form);Living will Out of facility DNR (pink MOST or yellow form);Healthcare Power of [place];Living will  Does patient want to make changes to medical advance directive? No - Patient declined No - Patient declined No - Patient declined No - Patient declined No - Patient declined No - Patient declined No - Patient declined  Copy of Healthcare Power of Attorney in Chart? Yes - validated most recent copy scanned in chart (See row information) Yes - validated most recent copy scanned in chart (See row information) Yes -  validated most recent copy scanned in chart (See row information) Yes - validated most recent copy scanned in chart (See row information) Yes - validated most recent copy scanned in chart (See row information)  No - copy requested  Pre-existing out of facility DNR order (yellow form or pink MOST form)      Yellow form placed in chart (order not valid for inpatient use) Yellow form placed in chart (order not valid for inpatient use)    Current Medications (verified) Outpatient Encounter Medications as of 05/28/2023  Medication Sig   acetaminophen (TYLENOL 8 HOUR ARTHRITIS PAIN) 650 MG CR tablet Take 650 mg by mouth 2 (two) times daily.   acetaminophen (TYLENOL) 500 MG tablet Take 1,000 mg by mouth 2 (two) times daily as needed.   Calcium Carbonate (CALCIUM 600 PO) Take 1 tablet by mouth daily.   calcium carbonate (TUMS - DOSED IN MG ELEMENTAL CALCIUM) 500 MG chewable tablet Chew 1 tablet by mouth 2 (two) times daily as needed for indigestion or heartburn.   diclofenac Sodium (VOLTAREN) 1 % GEL Apply 1 g topically 3 (three) times daily.   Emollient (CERAVE) LOTN Apply 1 application. topically daily.   fluticasone (FLONASE) 50 MCG/ACT nasal spray Place 2 sprays into both nostrils daily.   memantine (NAMENDA) 10 MG tablet Take 10 mg by mouth 2 (two) times daily.   MULTIPLE VITAMIN PO Take by mouth daily.   potassium chloride SA (KLOR-CON M) 20 MEQ tablet Take 30 mEq by mouth once.  torsemide (DEMADEX) 20 MG tablet Take 40 mg by mouth daily.   No facility-administered encounter medications on file as of 05/28/2023.    Allergies (verified) Evista [raloxifene], Fosamax [alendronate], Penicillins, Polyoxyethylene lauryl ether [sorbitan], and Moxifloxacin   History: Past Medical History:  Diagnosis Date   Anxiety    Cataract    Chest pain 02/16/2010   Myoview, negative for pharmacologic-stress induced ischemia, EF 74   Complication of anesthesia 1954   hard to wake up   Diverticulosis    DUB  (dysfunctional uterine bleeding)    Dysrhythmia    h/o PVCs- none recently- no meds   Fall    GERD (gastroesophageal reflux disease)    Hearing reduced    Hypertension    no meds currently   Loss of consciousness (HCC)    Memory loss    Osteoarthritis of left hip    Osteoporosis    Palpitation 02/02/2013   A [facility] MCOT (mobile cardiac outpatient telemetry ): NSR with PACs; sinus tachycardia with ventricular trigeminy. Brief atrial run. Facet cardiac; ventricular trigeminy and accelerated idioventricular rhythm. No significant tachyarrhythmias.   Palpitations    Postmenopausal bleeding 11/01/2014   Unsteady gait    Varicose veins of both legs with pain    Past Surgical History:  Procedure Laterality Date   APPENDECTOMY     CATARACT EXTRACTION     both eyes   CESAREAN SECTION  1962, 1963, [phone]   x 4   COLONOSCOPY  01/14/2017   DIAGNOSTIC LAPAROSCOPY     ECTOPIC PREGNANCY SURGERY  1966   FRACTURE SURGERY     HIP SURGERY Right    HYSTEROSCOPY WITH D & C N/A 11/02/2014   Procedure: DILATATION AND CURETTAGE /HYSTEROSCOPY;  Surgeon: Sherian Rein, MD;  Location: [facility];  Service: Gynecology;  Laterality: N/A;   Lower Extremity Venous Dopplers  01/2013   No thrombus or thrombophlebitis. R Greater Saphenous Vein - enlarged without other insufficiency (6 mm); right and left Small Saphenous Vein: Patent no valve insufficiency.   TONSILLECTOMY     TONSILLECTOMY AND ADENOIDECTOMY     TRANSTHORACIC ECHOCARDIOGRAM  01/2013   Normal LV size and function. EF 55-60%. Grade 1 diastolic dysfunction. Elevated LV filling pressures. No regional W. May.   Family History  Problem Relation Age of Onset   Stroke Mother 37   Hypertension Mother    Colon cancer Mother 51   Hypertension Father    Stroke Father        2   Dementia Father    Dementia Sister    Hypertension Son 45       Started on BP medicine age [age]   Social History   Socioeconomic History   Marital status:  Widowed    Spouse name: Not on file   Number of children: Not on file   Years of education: Not on file   Highest education level: Not on file  Occupational History   Not on file  Tobacco Use   Smoking status: Never   Smokeless tobacco: Never  Vaping Use   Vaping status: Never Used  Substance and Sexual Activity   Alcohol use: Yes    Alcohol/week: 7.0 standard drinks of alcohol    Types: 7 Standard drinks or equivalent per week    Comment: occasionally   Drug use: No   Sexual activity: Not on file  Other Topics Concern   Not on file  Social History Narrative   Tobaccco-None Never a tobacco user.  Alcohol use <1 drink per week.    Regular diet.    Patient does consume caffeine-Coffee and tea.   Marital status-Widow, married since 71.   Patient lives alone. No pets.    Past profession housewife, Lexicographer.   No exercise.    No DNR form and does not want to discuss one.    Patient has a HPOA          Social Determinants of Health   Financial Resource Strain: Low Risk  (05/28/2023)   Overall Financial Resource Strain (CARDIA)    Difficulty of Paying Living Expenses: Not hard at all  Food Insecurity: No Food Insecurity (05/28/2023)   Hunger Vital Sign    Worried About Running Out of Food in the Last Year: Never true    Ran Out of Food in the Last Year: Never true  Transportation Needs: No Transportation Needs (05/28/2023)   PRAPARE - Administrator, Civil Service (Medical): No    Lack of Transportation (Non-Medical): No  Physical Activity: Inactive (05/28/2023)   Exercise Vital Sign    Days of Exercise per Week: 0 days    Minutes of Exercise per Session: 0 min  Stress: No Stress Concern Present (05/28/2023)   [employer] of Occupational Health - Occupational Stress Questionnaire    Feeling of Stress : Not at all  Social Connections: Unknown (05/28/2023)   Social Connection and Isolation Panel [NHANES]    Frequency of Communication with Friends  and Family: Patient unable to answer    Frequency of Social Gatherings with Friends and Family: Patient unable to answer    Attends Religious Services: Patient unable to answer    Active Member of Clubs or Organizations: Patient unable to answer    Attends Banker Meetings: Patient unable to answer    Marital Status: Widowed    Tobacco Counseling Counseling given: Not Answered   Clinical Intake:  Pre-visit preparation completed: Yes  Pain : Faces Faces Pain Scale: Hurts a little bit Pain Type: Chronic pain Pain Location: Leg Pain Orientation: Right, Left, Lower Pain Descriptors / Indicators: Sore Pain Frequency: Intermittent Effect of Pain on Daily Activities: decreased mobility, increased risk for falls  Faces Pain Scale: Hurts a little bit  BMI - recorded: 24.53 Nutritional Risks: None Diabetes: No  How often do you need to have someone help you when you read instructions, pamphlets, or other written materials from your doctor or pharmacy?: 4 - Often What is the last grade level you completed in school?: some college  Interpreter Needed?: No      Activities of Daily Living    05/28/2023   12:39 PM  In your present state of health, do you have any difficulty performing the following activities:  Hearing? 0  Vision? 0  Difficulty concentrating or making decisions? 1  Dressing or bathing? 1  Doing errands, shopping? 1  Preparing Food and eating ? Y  Using the Toilet? Y  In the past six months, have you accidently leaked urine? Y  Do you have problems with loss of bowel control? N  Managing your Medications? Y  Managing your Finances? Y  Housekeeping or managing your Housekeeping? Y    Patient Care Team: Mahlon Gammon, MD as PCP - General (Internal Medicine) [facility], [facility] (Ophthalmology) Dermatology, [facility] (Dermatology)  Indicate any recent Medical Services you may have received from other than [facility] providers in the past year  (date may be approximate).  Assessment:   This is a routine wellness examination for Deniece.  Hearing/Vision screen No results found.   Goals Addressed             This Visit's Progress    Maintain Mobility and Function   On track    Evidence-based guidance:  Emphasize the importance of physical activity and aerobic exercise as included in treatment plan; assess barriers to adherence; consider patient's abilities and preferences.  Encourage gradual increase in activity or exercise instead of stopping if pain occurs.  Reinforce individual therapy exercise prescription, such as strengthening, stabilization and stretching programs.  Promote optimal body mechanics to stabilize the spine with lifting and functional activity.  Encourage activity and mobility modifications to facilitate optimal function, such as using a log roll for bed mobility or dressing from a seated position.  Reinforce individual adaptive equipment recommendations to limit excessive spinal movements, such as a Event organiser.  Assess adequacy of sleep; encourage use of sleep hygiene techniques, such as bedtime routine; use of white noise; dark, cool bedroom; avoiding daytime naps, heavy meals or exercise before bedtime.  Promote positions and modification to optimize sleep and sexual activity; consider pillows or positioning devices to assist in maintaining neutral spine.  Explore options for applying ergonomic principles at work and home, such as frequent position changes, using ergonomically designed equipment and working at optimal height.  Promote modifications to increase comfort with driving such as lumbar support, optimizing seat and steering wheel position, using cruise control and taking frequent rest stops to stretch and walk.   Notes:        Depression Screen    05/28/2023   12:41 PM 05/22/2022    2:37 PM 05/14/2021    1:37 PM  PHQ 2/9 Scores  PHQ - 2 Score  0 0  Exception Documentation  Medical reason      Fall Risk    05/28/2023   12:41 PM 07/23/2022    2:47 PM 05/22/2022    2:37 PM 05/14/2021    1:40 PM 10/23/2019    1:44 PM  Fall Risk   Falls in the past year? 1 0 0 1 0  Number falls in past yr: 1 0 0 0   Injury with Fall? 1 0 0 0   Risk for fall due to : History of fall(s);Impaired balance/gait;Impaired mobility No Fall Risks History of fall(s) History of fall(s);Impaired balance/gait   Follow up Falls evaluation completed;Education provided;Falls prevention discussed Falls evaluation completed Falls evaluation completed;Education provided;Falls prevention discussed Falls evaluation completed;Education provided;Falls prevention discussed     MEDICARE RISK AT HOME: Medicare Risk at Home Any stairs in or around the home?: No If so, are there any without handrails?: No Home free of loose throw rugs in walkways, pet beds, electrical cords, etc?: Yes Adequate lighting in your home to reduce risk of falls?: Yes Life alert?: No Use of a cane, walker or w/c?: Yes Grab bars in the bathroom?: Yes Shower chair or bench in shower?: Yes Elevated toilet seat or a handicapped toilet?: Yes  TIMED UP AND GO:  Was the test performed?  No    Cognitive Function:    05/22/2022    3:00 PM 05/14/2021    1:42 PM 07/25/2020    1:26 PM 01/22/2020    1:39 PM 10/23/2019    1:44 PM  MMSE - Mini Mental State Exam  Not completed: Refused Unable to complete     Orientation to time   2 1 3   Orientation  to Place   5 5 3   Registration   3 3 3   Attention/ Calculation   3 1 5   Attention/Calculation-comments     couldnt do numbers  Recall   0 0 0  Language- name 2 objects   2 2 2   Language- repeat   1 1 1   Language- follow 3 step command   3 3 3   Language- read & follow direction   1 1 1   Write a sentence   1 1 1   Copy design   1 1 1   Copy design-comments    10 animals   Total score   22 19 23         05/22/2022    3:01 PM 05/14/2021    1:42 PM  6CIT Screen  What Year? 4 points 4  points  What month? 0 points 3 points  What time? 3 points 3 points  Count back from 20 2 points 2 points  Months in reverse 2 points 2 points  Repeat phrase 6 points 6 points  Total Score 17 points 20 points    Immunizations Immunization History  Administered Date(s) Administered   Fluad Quad(high Dose 65+) 07/08/2022   Influenza, Quadrivalent, Recombinant, Inj, Pf 06/18/2018, 05/04/2019, 06/15/2020   Influenza-Unspecified 09/15/2019, 07/08/2021   Moderna Sars-Covid-2 Vaccination 09/15/2019   PFIZER(Purple Top)SARS-COV-2 Vaccination 10/03/2019, 10/22/2019, 09/03/2020   Pfizer Covid-19 Vaccine Bivalent Booster 84yrs & up 06/04/2021   Pneumococcal Conjugate-13 05/28/2014   Pneumococcal Polysaccharide-23 07/15/1998, 06/15/2005   Tdap 05/26/2016   Unspecified SARS-COV-2 Vaccination 03/03/2022   Zoster, Live 05/18/2013, 05/28/2014    TDAP status: Up to date  Flu Vaccine status: Due, Education has been provided regarding the importance of this vaccine. Advised may receive this vaccine at local pharmacy or Health Dept. Aware to provide a copy of the vaccination record if obtained from local pharmacy or Health Dept. Verbalized acceptance and understanding.  Pneumococcal vaccine status: Up to date  Covid-19 vaccine status: Completed vaccines  Qualifies for Shingles Vaccine? Yes   Zostavax completed Yes   Shingrix Completed?: No.    Education has been provided regarding the importance of this vaccine. Patient has been advised to call insurance company to determine out of pocket expense if they have not yet received this vaccine. Advised may also receive vaccine at local pharmacy or Health Dept. Verbalized acceptance and understanding.  Screening Tests Health Maintenance  Topic Date Due   Zoster Vaccines- Shingrix (1 of 2) 03/07/1983   INFLUENZA VACCINE  04/15/2023   COVID-19 Vaccine (7 - 2023-24 season) 05/16/2023   DEXA SCAN  11/05/2023 (Originally 03/06/1998)   Medicare Annual  Wellness (AWV)  05/27/2024   DTaP/Tdap/Td (2 - Td or Tdap) 05/26/2026   Pneumonia Vaccine 60+ Years old  Completed   HPV VACCINES  Aged Out    Health Maintenance  Health Maintenance Due  Topic Date Due   Zoster Vaccines- Shingrix (1 of 2) 03/07/1983   INFLUENZA VACCINE  04/15/2023   COVID-19 Vaccine (7 - 2023-24 season) 05/16/2023    Colorectal cancer screening: No longer required.   Mammogram status: No longer required due to advanced age.  Bone Density status: Completed 1999. Results reflect: Bone density results: OSTEOPOROSIS. Repeat every H/o hip fracture> no further studies per goals of care years.  Lung Cancer Screening: (Low Dose CT Chest recommended if Age 25-80 years, 20 pack-year currently smoking OR have quit w/in 15years.) does not qualify.   Lung Cancer Screening Referral: No  Additional Screening:  Hepatitis C  Screening: does not qualify; Completed   Vision Screening: Recommended annual ophthalmology exams for early detection of glaucoma and other disorders of the eye. Is the patient up to date with their annual eye exam?  Yes  Who is the provider or what is the name of the office in which the patient attends annual eye exams? [facility] in house provider If pt is not established with a provider, would they like to be referred to a provider to establish care? No .   Dental Screening: Recommended annual dental exams for proper oral hygiene  Diabetic Foot Exam: Diabetic Foot Exam: Completed 05/26/2023  Community Resource Referral / Chronic Care Management: CRR required this visit?  No   CCM required this visit?  No     Plan:     I have personally reviewed and noted the following in the patient's chart:   Medical and social history Use of alcohol, tobacco or illicit drugs  Current medications and supplements including opioid prescriptions. Patient is not currently taking opioid prescriptions. Functional ability and status Nutritional status Physical  activity Advanced directives List of other physicians Hospitalizations, surgeries, and ER visits in previous 12 months Vitals Screenings to include cognitive, depression, and falls Referrals and appointments  In addition, I have reviewed and discussed with patient certain preventive protocols, quality metrics, and best practice recommendations. A written personalized care plan for preventive services as well as general preventive health recommendations were provided to patient.     Octavia Heir, NP   05/28/2023   After Visit Summary: (MyChart) Due to this being a telephonic visit, the after visit summary with patients personalized plan was offered to patient via MyChart   Nurse Notes: Lives in assisted living at [facility]. Flu/covid to be given 06/2023 per [facility]. MMSE 24/30 (04/07/2023). Recent BIMS score 9/15 (07/09).

## 2023-05-31 DIAGNOSIS — H9190 Unspecified hearing loss, unspecified ear: Secondary | ICD-10-CM | POA: Diagnosis not present

## 2023-05-31 DIAGNOSIS — R4701 Aphasia: Secondary | ICD-10-CM | POA: Diagnosis not present

## 2023-05-31 DIAGNOSIS — R278 Other lack of coordination: Secondary | ICD-10-CM | POA: Diagnosis not present

## 2023-05-31 DIAGNOSIS — R2681 Unsteadiness on feet: Secondary | ICD-10-CM | POA: Diagnosis not present

## 2023-05-31 DIAGNOSIS — G309 Alzheimer's disease, unspecified: Secondary | ICD-10-CM | POA: Diagnosis not present

## 2023-05-31 DIAGNOSIS — M6281 Muscle weakness (generalized): Secondary | ICD-10-CM | POA: Diagnosis not present

## 2023-06-01 DIAGNOSIS — H9190 Unspecified hearing loss, unspecified ear: Secondary | ICD-10-CM | POA: Diagnosis not present

## 2023-06-01 DIAGNOSIS — G309 Alzheimer's disease, unspecified: Secondary | ICD-10-CM | POA: Diagnosis not present

## 2023-06-01 DIAGNOSIS — R2681 Unsteadiness on feet: Secondary | ICD-10-CM | POA: Diagnosis not present

## 2023-06-01 DIAGNOSIS — R4701 Aphasia: Secondary | ICD-10-CM | POA: Diagnosis not present

## 2023-06-01 DIAGNOSIS — M6281 Muscle weakness (generalized): Secondary | ICD-10-CM | POA: Diagnosis not present

## 2023-06-01 DIAGNOSIS — R278 Other lack of coordination: Secondary | ICD-10-CM | POA: Diagnosis not present

## 2023-06-02 DIAGNOSIS — R278 Other lack of coordination: Secondary | ICD-10-CM | POA: Diagnosis not present

## 2023-06-02 DIAGNOSIS — R2681 Unsteadiness on feet: Secondary | ICD-10-CM | POA: Diagnosis not present

## 2023-06-02 DIAGNOSIS — H9190 Unspecified hearing loss, unspecified ear: Secondary | ICD-10-CM | POA: Diagnosis not present

## 2023-06-02 DIAGNOSIS — G309 Alzheimer's disease, unspecified: Secondary | ICD-10-CM | POA: Diagnosis not present

## 2023-06-02 DIAGNOSIS — R4701 Aphasia: Secondary | ICD-10-CM | POA: Diagnosis not present

## 2023-06-02 DIAGNOSIS — M6281 Muscle weakness (generalized): Secondary | ICD-10-CM | POA: Diagnosis not present

## 2023-06-04 DIAGNOSIS — R278 Other lack of coordination: Secondary | ICD-10-CM | POA: Diagnosis not present

## 2023-06-04 DIAGNOSIS — H9190 Unspecified hearing loss, unspecified ear: Secondary | ICD-10-CM | POA: Diagnosis not present

## 2023-06-04 DIAGNOSIS — R4701 Aphasia: Secondary | ICD-10-CM | POA: Diagnosis not present

## 2023-06-04 DIAGNOSIS — M6281 Muscle weakness (generalized): Secondary | ICD-10-CM | POA: Diagnosis not present

## 2023-06-04 DIAGNOSIS — R2681 Unsteadiness on feet: Secondary | ICD-10-CM | POA: Diagnosis not present

## 2023-06-04 DIAGNOSIS — G309 Alzheimer's disease, unspecified: Secondary | ICD-10-CM | POA: Diagnosis not present

## 2023-06-07 DIAGNOSIS — R2681 Unsteadiness on feet: Secondary | ICD-10-CM | POA: Diagnosis not present

## 2023-06-07 DIAGNOSIS — M6281 Muscle weakness (generalized): Secondary | ICD-10-CM | POA: Diagnosis not present

## 2023-06-07 DIAGNOSIS — G309 Alzheimer's disease, unspecified: Secondary | ICD-10-CM | POA: Diagnosis not present

## 2023-06-07 DIAGNOSIS — R278 Other lack of coordination: Secondary | ICD-10-CM | POA: Diagnosis not present

## 2023-06-07 DIAGNOSIS — R4701 Aphasia: Secondary | ICD-10-CM | POA: Diagnosis not present

## 2023-06-07 DIAGNOSIS — H9190 Unspecified hearing loss, unspecified ear: Secondary | ICD-10-CM | POA: Diagnosis not present

## 2023-06-09 DIAGNOSIS — R278 Other lack of coordination: Secondary | ICD-10-CM | POA: Diagnosis not present

## 2023-06-09 DIAGNOSIS — R4701 Aphasia: Secondary | ICD-10-CM | POA: Diagnosis not present

## 2023-06-09 DIAGNOSIS — R2681 Unsteadiness on feet: Secondary | ICD-10-CM | POA: Diagnosis not present

## 2023-06-09 DIAGNOSIS — G309 Alzheimer's disease, unspecified: Secondary | ICD-10-CM | POA: Diagnosis not present

## 2023-06-09 DIAGNOSIS — H9190 Unspecified hearing loss, unspecified ear: Secondary | ICD-10-CM | POA: Diagnosis not present

## 2023-06-09 DIAGNOSIS — M6281 Muscle weakness (generalized): Secondary | ICD-10-CM | POA: Diagnosis not present

## 2023-06-11 DIAGNOSIS — R4701 Aphasia: Secondary | ICD-10-CM | POA: Diagnosis not present

## 2023-06-11 DIAGNOSIS — M6281 Muscle weakness (generalized): Secondary | ICD-10-CM | POA: Diagnosis not present

## 2023-06-11 DIAGNOSIS — H9190 Unspecified hearing loss, unspecified ear: Secondary | ICD-10-CM | POA: Diagnosis not present

## 2023-06-11 DIAGNOSIS — R278 Other lack of coordination: Secondary | ICD-10-CM | POA: Diagnosis not present

## 2023-06-11 DIAGNOSIS — G309 Alzheimer's disease, unspecified: Secondary | ICD-10-CM | POA: Diagnosis not present

## 2023-06-11 DIAGNOSIS — R2681 Unsteadiness on feet: Secondary | ICD-10-CM | POA: Diagnosis not present

## 2023-06-15 DIAGNOSIS — H9190 Unspecified hearing loss, unspecified ear: Secondary | ICD-10-CM | POA: Diagnosis not present

## 2023-06-15 DIAGNOSIS — G309 Alzheimer's disease, unspecified: Secondary | ICD-10-CM | POA: Diagnosis not present

## 2023-06-15 DIAGNOSIS — R2681 Unsteadiness on feet: Secondary | ICD-10-CM | POA: Diagnosis not present

## 2023-06-15 DIAGNOSIS — R278 Other lack of coordination: Secondary | ICD-10-CM | POA: Diagnosis not present

## 2023-06-15 DIAGNOSIS — R41841 Cognitive communication deficit: Secondary | ICD-10-CM | POA: Diagnosis not present

## 2023-06-15 DIAGNOSIS — R4701 Aphasia: Secondary | ICD-10-CM | POA: Diagnosis not present

## 2023-06-15 DIAGNOSIS — M6281 Muscle weakness (generalized): Secondary | ICD-10-CM | POA: Diagnosis not present

## 2023-06-16 DIAGNOSIS — R4701 Aphasia: Secondary | ICD-10-CM | POA: Diagnosis not present

## 2023-06-16 DIAGNOSIS — R278 Other lack of coordination: Secondary | ICD-10-CM | POA: Diagnosis not present

## 2023-06-16 DIAGNOSIS — R2681 Unsteadiness on feet: Secondary | ICD-10-CM | POA: Diagnosis not present

## 2023-06-16 DIAGNOSIS — R41841 Cognitive communication deficit: Secondary | ICD-10-CM | POA: Diagnosis not present

## 2023-06-16 DIAGNOSIS — M6281 Muscle weakness (generalized): Secondary | ICD-10-CM | POA: Diagnosis not present

## 2023-06-16 DIAGNOSIS — G309 Alzheimer's disease, unspecified: Secondary | ICD-10-CM | POA: Diagnosis not present

## 2023-06-17 DIAGNOSIS — R2681 Unsteadiness on feet: Secondary | ICD-10-CM | POA: Diagnosis not present

## 2023-06-17 DIAGNOSIS — M6281 Muscle weakness (generalized): Secondary | ICD-10-CM | POA: Diagnosis not present

## 2023-06-17 DIAGNOSIS — R278 Other lack of coordination: Secondary | ICD-10-CM | POA: Diagnosis not present

## 2023-06-17 DIAGNOSIS — R4701 Aphasia: Secondary | ICD-10-CM | POA: Diagnosis not present

## 2023-06-17 DIAGNOSIS — R41841 Cognitive communication deficit: Secondary | ICD-10-CM | POA: Diagnosis not present

## 2023-06-17 DIAGNOSIS — G309 Alzheimer's disease, unspecified: Secondary | ICD-10-CM | POA: Diagnosis not present

## 2023-06-18 DIAGNOSIS — M6281 Muscle weakness (generalized): Secondary | ICD-10-CM | POA: Diagnosis not present

## 2023-06-18 DIAGNOSIS — R2681 Unsteadiness on feet: Secondary | ICD-10-CM | POA: Diagnosis not present

## 2023-06-18 DIAGNOSIS — R4701 Aphasia: Secondary | ICD-10-CM | POA: Diagnosis not present

## 2023-06-18 DIAGNOSIS — R41841 Cognitive communication deficit: Secondary | ICD-10-CM | POA: Diagnosis not present

## 2023-06-18 DIAGNOSIS — G309 Alzheimer's disease, unspecified: Secondary | ICD-10-CM | POA: Diagnosis not present

## 2023-06-18 DIAGNOSIS — R278 Other lack of coordination: Secondary | ICD-10-CM | POA: Diagnosis not present

## 2023-06-19 DIAGNOSIS — M6281 Muscle weakness (generalized): Secondary | ICD-10-CM | POA: Diagnosis not present

## 2023-06-19 DIAGNOSIS — R41841 Cognitive communication deficit: Secondary | ICD-10-CM | POA: Diagnosis not present

## 2023-06-19 DIAGNOSIS — G309 Alzheimer's disease, unspecified: Secondary | ICD-10-CM | POA: Diagnosis not present

## 2023-06-19 DIAGNOSIS — R2681 Unsteadiness on feet: Secondary | ICD-10-CM | POA: Diagnosis not present

## 2023-06-19 DIAGNOSIS — R278 Other lack of coordination: Secondary | ICD-10-CM | POA: Diagnosis not present

## 2023-06-19 DIAGNOSIS — R4701 Aphasia: Secondary | ICD-10-CM | POA: Diagnosis not present

## 2023-06-21 DIAGNOSIS — G309 Alzheimer's disease, unspecified: Secondary | ICD-10-CM | POA: Diagnosis not present

## 2023-06-21 DIAGNOSIS — H524 Presbyopia: Secondary | ICD-10-CM | POA: Diagnosis not present

## 2023-06-21 DIAGNOSIS — R278 Other lack of coordination: Secondary | ICD-10-CM | POA: Diagnosis not present

## 2023-06-21 DIAGNOSIS — R41841 Cognitive communication deficit: Secondary | ICD-10-CM | POA: Diagnosis not present

## 2023-06-21 DIAGNOSIS — Z961 Presence of intraocular lens: Secondary | ICD-10-CM | POA: Diagnosis not present

## 2023-06-21 DIAGNOSIS — R4701 Aphasia: Secondary | ICD-10-CM | POA: Diagnosis not present

## 2023-06-21 DIAGNOSIS — R2681 Unsteadiness on feet: Secondary | ICD-10-CM | POA: Diagnosis not present

## 2023-06-21 DIAGNOSIS — M6281 Muscle weakness (generalized): Secondary | ICD-10-CM | POA: Diagnosis not present

## 2023-06-22 DIAGNOSIS — M6281 Muscle weakness (generalized): Secondary | ICD-10-CM | POA: Diagnosis not present

## 2023-06-22 DIAGNOSIS — R2681 Unsteadiness on feet: Secondary | ICD-10-CM | POA: Diagnosis not present

## 2023-06-22 DIAGNOSIS — R278 Other lack of coordination: Secondary | ICD-10-CM | POA: Diagnosis not present

## 2023-06-22 DIAGNOSIS — G309 Alzheimer's disease, unspecified: Secondary | ICD-10-CM | POA: Diagnosis not present

## 2023-06-22 DIAGNOSIS — R41841 Cognitive communication deficit: Secondary | ICD-10-CM | POA: Diagnosis not present

## 2023-06-22 DIAGNOSIS — R4701 Aphasia: Secondary | ICD-10-CM | POA: Diagnosis not present

## 2023-06-23 DIAGNOSIS — R4701 Aphasia: Secondary | ICD-10-CM | POA: Diagnosis not present

## 2023-06-23 DIAGNOSIS — R278 Other lack of coordination: Secondary | ICD-10-CM | POA: Diagnosis not present

## 2023-06-23 DIAGNOSIS — M6281 Muscle weakness (generalized): Secondary | ICD-10-CM | POA: Diagnosis not present

## 2023-06-23 DIAGNOSIS — R2681 Unsteadiness on feet: Secondary | ICD-10-CM | POA: Diagnosis not present

## 2023-06-23 DIAGNOSIS — G309 Alzheimer's disease, unspecified: Secondary | ICD-10-CM | POA: Diagnosis not present

## 2023-06-23 DIAGNOSIS — R41841 Cognitive communication deficit: Secondary | ICD-10-CM | POA: Diagnosis not present

## 2023-06-25 DIAGNOSIS — M6281 Muscle weakness (generalized): Secondary | ICD-10-CM | POA: Diagnosis not present

## 2023-06-25 DIAGNOSIS — R2681 Unsteadiness on feet: Secondary | ICD-10-CM | POA: Diagnosis not present

## 2023-06-25 DIAGNOSIS — R278 Other lack of coordination: Secondary | ICD-10-CM | POA: Diagnosis not present

## 2023-06-25 DIAGNOSIS — R41841 Cognitive communication deficit: Secondary | ICD-10-CM | POA: Diagnosis not present

## 2023-06-25 DIAGNOSIS — G309 Alzheimer's disease, unspecified: Secondary | ICD-10-CM | POA: Diagnosis not present

## 2023-06-25 DIAGNOSIS — R4701 Aphasia: Secondary | ICD-10-CM | POA: Diagnosis not present

## 2023-06-29 DIAGNOSIS — M6281 Muscle weakness (generalized): Secondary | ICD-10-CM | POA: Diagnosis not present

## 2023-06-29 DIAGNOSIS — R4701 Aphasia: Secondary | ICD-10-CM | POA: Diagnosis not present

## 2023-06-29 DIAGNOSIS — R2681 Unsteadiness on feet: Secondary | ICD-10-CM | POA: Diagnosis not present

## 2023-06-29 DIAGNOSIS — R278 Other lack of coordination: Secondary | ICD-10-CM | POA: Diagnosis not present

## 2023-06-29 DIAGNOSIS — R41841 Cognitive communication deficit: Secondary | ICD-10-CM | POA: Diagnosis not present

## 2023-06-29 DIAGNOSIS — G309 Alzheimer's disease, unspecified: Secondary | ICD-10-CM | POA: Diagnosis not present

## 2023-06-30 DIAGNOSIS — R41841 Cognitive communication deficit: Secondary | ICD-10-CM | POA: Diagnosis not present

## 2023-06-30 DIAGNOSIS — M6281 Muscle weakness (generalized): Secondary | ICD-10-CM | POA: Diagnosis not present

## 2023-06-30 DIAGNOSIS — R278 Other lack of coordination: Secondary | ICD-10-CM | POA: Diagnosis not present

## 2023-06-30 DIAGNOSIS — G309 Alzheimer's disease, unspecified: Secondary | ICD-10-CM | POA: Diagnosis not present

## 2023-06-30 DIAGNOSIS — R4701 Aphasia: Secondary | ICD-10-CM | POA: Diagnosis not present

## 2023-06-30 DIAGNOSIS — R2681 Unsteadiness on feet: Secondary | ICD-10-CM | POA: Diagnosis not present

## 2023-07-01 DIAGNOSIS — R278 Other lack of coordination: Secondary | ICD-10-CM | POA: Diagnosis not present

## 2023-07-01 DIAGNOSIS — M6281 Muscle weakness (generalized): Secondary | ICD-10-CM | POA: Diagnosis not present

## 2023-07-01 DIAGNOSIS — R4701 Aphasia: Secondary | ICD-10-CM | POA: Diagnosis not present

## 2023-07-01 DIAGNOSIS — G309 Alzheimer's disease, unspecified: Secondary | ICD-10-CM | POA: Diagnosis not present

## 2023-07-01 DIAGNOSIS — R41841 Cognitive communication deficit: Secondary | ICD-10-CM | POA: Diagnosis not present

## 2023-07-01 DIAGNOSIS — R2681 Unsteadiness on feet: Secondary | ICD-10-CM | POA: Diagnosis not present

## 2023-07-02 DIAGNOSIS — R278 Other lack of coordination: Secondary | ICD-10-CM | POA: Diagnosis not present

## 2023-07-02 DIAGNOSIS — G309 Alzheimer's disease, unspecified: Secondary | ICD-10-CM | POA: Diagnosis not present

## 2023-07-02 DIAGNOSIS — R2681 Unsteadiness on feet: Secondary | ICD-10-CM | POA: Diagnosis not present

## 2023-07-02 DIAGNOSIS — M6281 Muscle weakness (generalized): Secondary | ICD-10-CM | POA: Diagnosis not present

## 2023-07-02 DIAGNOSIS — R41841 Cognitive communication deficit: Secondary | ICD-10-CM | POA: Diagnosis not present

## 2023-07-02 DIAGNOSIS — R4701 Aphasia: Secondary | ICD-10-CM | POA: Diagnosis not present

## 2023-07-05 DIAGNOSIS — G309 Alzheimer's disease, unspecified: Secondary | ICD-10-CM | POA: Diagnosis not present

## 2023-07-05 DIAGNOSIS — M6281 Muscle weakness (generalized): Secondary | ICD-10-CM | POA: Diagnosis not present

## 2023-07-05 DIAGNOSIS — R4701 Aphasia: Secondary | ICD-10-CM | POA: Diagnosis not present

## 2023-07-05 DIAGNOSIS — R278 Other lack of coordination: Secondary | ICD-10-CM | POA: Diagnosis not present

## 2023-07-05 DIAGNOSIS — R41841 Cognitive communication deficit: Secondary | ICD-10-CM | POA: Diagnosis not present

## 2023-07-05 DIAGNOSIS — R2681 Unsteadiness on feet: Secondary | ICD-10-CM | POA: Diagnosis not present

## 2023-07-06 DIAGNOSIS — G309 Alzheimer's disease, unspecified: Secondary | ICD-10-CM | POA: Diagnosis not present

## 2023-07-06 DIAGNOSIS — R41841 Cognitive communication deficit: Secondary | ICD-10-CM | POA: Diagnosis not present

## 2023-07-06 DIAGNOSIS — M6281 Muscle weakness (generalized): Secondary | ICD-10-CM | POA: Diagnosis not present

## 2023-07-06 DIAGNOSIS — R278 Other lack of coordination: Secondary | ICD-10-CM | POA: Diagnosis not present

## 2023-07-06 DIAGNOSIS — R2681 Unsteadiness on feet: Secondary | ICD-10-CM | POA: Diagnosis not present

## 2023-07-06 DIAGNOSIS — R4701 Aphasia: Secondary | ICD-10-CM | POA: Diagnosis not present

## 2023-07-08 DIAGNOSIS — G309 Alzheimer's disease, unspecified: Secondary | ICD-10-CM | POA: Diagnosis not present

## 2023-07-08 DIAGNOSIS — R4701 Aphasia: Secondary | ICD-10-CM | POA: Diagnosis not present

## 2023-07-08 DIAGNOSIS — M6281 Muscle weakness (generalized): Secondary | ICD-10-CM | POA: Diagnosis not present

## 2023-07-08 DIAGNOSIS — R2681 Unsteadiness on feet: Secondary | ICD-10-CM | POA: Diagnosis not present

## 2023-07-08 DIAGNOSIS — R41841 Cognitive communication deficit: Secondary | ICD-10-CM | POA: Diagnosis not present

## 2023-07-08 DIAGNOSIS — R278 Other lack of coordination: Secondary | ICD-10-CM | POA: Diagnosis not present

## 2023-07-09 DIAGNOSIS — R2681 Unsteadiness on feet: Secondary | ICD-10-CM | POA: Diagnosis not present

## 2023-07-09 DIAGNOSIS — R4701 Aphasia: Secondary | ICD-10-CM | POA: Diagnosis not present

## 2023-07-09 DIAGNOSIS — G309 Alzheimer's disease, unspecified: Secondary | ICD-10-CM | POA: Diagnosis not present

## 2023-07-09 DIAGNOSIS — M6281 Muscle weakness (generalized): Secondary | ICD-10-CM | POA: Diagnosis not present

## 2023-07-09 DIAGNOSIS — R278 Other lack of coordination: Secondary | ICD-10-CM | POA: Diagnosis not present

## 2023-07-09 DIAGNOSIS — R41841 Cognitive communication deficit: Secondary | ICD-10-CM | POA: Diagnosis not present

## 2023-07-12 DIAGNOSIS — R41841 Cognitive communication deficit: Secondary | ICD-10-CM | POA: Diagnosis not present

## 2023-07-12 DIAGNOSIS — R4701 Aphasia: Secondary | ICD-10-CM | POA: Diagnosis not present

## 2023-07-12 DIAGNOSIS — G309 Alzheimer's disease, unspecified: Secondary | ICD-10-CM | POA: Diagnosis not present

## 2023-07-12 DIAGNOSIS — R278 Other lack of coordination: Secondary | ICD-10-CM | POA: Diagnosis not present

## 2023-07-12 DIAGNOSIS — R2681 Unsteadiness on feet: Secondary | ICD-10-CM | POA: Diagnosis not present

## 2023-07-12 DIAGNOSIS — M6281 Muscle weakness (generalized): Secondary | ICD-10-CM | POA: Diagnosis not present

## 2023-07-13 DIAGNOSIS — R41841 Cognitive communication deficit: Secondary | ICD-10-CM | POA: Diagnosis not present

## 2023-07-13 DIAGNOSIS — M6281 Muscle weakness (generalized): Secondary | ICD-10-CM | POA: Diagnosis not present

## 2023-07-13 DIAGNOSIS — G309 Alzheimer's disease, unspecified: Secondary | ICD-10-CM | POA: Diagnosis not present

## 2023-07-13 DIAGNOSIS — R2681 Unsteadiness on feet: Secondary | ICD-10-CM | POA: Diagnosis not present

## 2023-07-13 DIAGNOSIS — R278 Other lack of coordination: Secondary | ICD-10-CM | POA: Diagnosis not present

## 2023-07-13 DIAGNOSIS — R4701 Aphasia: Secondary | ICD-10-CM | POA: Diagnosis not present

## 2023-07-14 DIAGNOSIS — R4701 Aphasia: Secondary | ICD-10-CM | POA: Diagnosis not present

## 2023-07-14 DIAGNOSIS — G309 Alzheimer's disease, unspecified: Secondary | ICD-10-CM | POA: Diagnosis not present

## 2023-07-14 DIAGNOSIS — R278 Other lack of coordination: Secondary | ICD-10-CM | POA: Diagnosis not present

## 2023-07-14 DIAGNOSIS — R2681 Unsteadiness on feet: Secondary | ICD-10-CM | POA: Diagnosis not present

## 2023-07-14 DIAGNOSIS — R41841 Cognitive communication deficit: Secondary | ICD-10-CM | POA: Diagnosis not present

## 2023-07-14 DIAGNOSIS — M6281 Muscle weakness (generalized): Secondary | ICD-10-CM | POA: Diagnosis not present

## 2023-07-15 DIAGNOSIS — R41841 Cognitive communication deficit: Secondary | ICD-10-CM | POA: Diagnosis not present

## 2023-07-15 DIAGNOSIS — G309 Alzheimer's disease, unspecified: Secondary | ICD-10-CM | POA: Diagnosis not present

## 2023-07-15 DIAGNOSIS — R278 Other lack of coordination: Secondary | ICD-10-CM | POA: Diagnosis not present

## 2023-07-15 DIAGNOSIS — R2681 Unsteadiness on feet: Secondary | ICD-10-CM | POA: Diagnosis not present

## 2023-07-15 DIAGNOSIS — M6281 Muscle weakness (generalized): Secondary | ICD-10-CM | POA: Diagnosis not present

## 2023-07-15 DIAGNOSIS — R4701 Aphasia: Secondary | ICD-10-CM | POA: Diagnosis not present

## 2023-07-19 DIAGNOSIS — R2681 Unsteadiness on feet: Secondary | ICD-10-CM | POA: Diagnosis not present

## 2023-07-19 DIAGNOSIS — M6281 Muscle weakness (generalized): Secondary | ICD-10-CM | POA: Diagnosis not present

## 2023-07-19 DIAGNOSIS — R41841 Cognitive communication deficit: Secondary | ICD-10-CM | POA: Diagnosis not present

## 2023-07-19 DIAGNOSIS — R278 Other lack of coordination: Secondary | ICD-10-CM | POA: Diagnosis not present

## 2023-07-19 DIAGNOSIS — G309 Alzheimer's disease, unspecified: Secondary | ICD-10-CM | POA: Diagnosis not present

## 2023-07-19 DIAGNOSIS — R4701 Aphasia: Secondary | ICD-10-CM | POA: Diagnosis not present

## 2023-07-19 DIAGNOSIS — H9190 Unspecified hearing loss, unspecified ear: Secondary | ICD-10-CM | POA: Diagnosis not present

## 2023-07-20 DIAGNOSIS — R41841 Cognitive communication deficit: Secondary | ICD-10-CM | POA: Diagnosis not present

## 2023-07-20 DIAGNOSIS — M6281 Muscle weakness (generalized): Secondary | ICD-10-CM | POA: Diagnosis not present

## 2023-07-20 DIAGNOSIS — R4701 Aphasia: Secondary | ICD-10-CM | POA: Diagnosis not present

## 2023-07-20 DIAGNOSIS — R2681 Unsteadiness on feet: Secondary | ICD-10-CM | POA: Diagnosis not present

## 2023-07-20 DIAGNOSIS — R278 Other lack of coordination: Secondary | ICD-10-CM | POA: Diagnosis not present

## 2023-07-20 DIAGNOSIS — G309 Alzheimer's disease, unspecified: Secondary | ICD-10-CM | POA: Diagnosis not present

## 2023-07-21 DIAGNOSIS — R41841 Cognitive communication deficit: Secondary | ICD-10-CM | POA: Diagnosis not present

## 2023-07-21 DIAGNOSIS — R278 Other lack of coordination: Secondary | ICD-10-CM | POA: Diagnosis not present

## 2023-07-21 DIAGNOSIS — R2681 Unsteadiness on feet: Secondary | ICD-10-CM | POA: Diagnosis not present

## 2023-07-21 DIAGNOSIS — R4701 Aphasia: Secondary | ICD-10-CM | POA: Diagnosis not present

## 2023-07-21 DIAGNOSIS — G309 Alzheimer's disease, unspecified: Secondary | ICD-10-CM | POA: Diagnosis not present

## 2023-07-21 DIAGNOSIS — Z23 Encounter for immunization: Secondary | ICD-10-CM | POA: Diagnosis not present

## 2023-07-21 DIAGNOSIS — M6281 Muscle weakness (generalized): Secondary | ICD-10-CM | POA: Diagnosis not present

## 2023-07-22 DIAGNOSIS — M6281 Muscle weakness (generalized): Secondary | ICD-10-CM | POA: Diagnosis not present

## 2023-07-22 DIAGNOSIS — R278 Other lack of coordination: Secondary | ICD-10-CM | POA: Diagnosis not present

## 2023-07-22 DIAGNOSIS — G309 Alzheimer's disease, unspecified: Secondary | ICD-10-CM | POA: Diagnosis not present

## 2023-07-22 DIAGNOSIS — R2681 Unsteadiness on feet: Secondary | ICD-10-CM | POA: Diagnosis not present

## 2023-07-22 DIAGNOSIS — R41841 Cognitive communication deficit: Secondary | ICD-10-CM | POA: Diagnosis not present

## 2023-07-22 DIAGNOSIS — R4701 Aphasia: Secondary | ICD-10-CM | POA: Diagnosis not present

## 2023-07-23 DIAGNOSIS — M6281 Muscle weakness (generalized): Secondary | ICD-10-CM | POA: Diagnosis not present

## 2023-07-23 DIAGNOSIS — R4701 Aphasia: Secondary | ICD-10-CM | POA: Diagnosis not present

## 2023-07-23 DIAGNOSIS — R2681 Unsteadiness on feet: Secondary | ICD-10-CM | POA: Diagnosis not present

## 2023-07-23 DIAGNOSIS — R41841 Cognitive communication deficit: Secondary | ICD-10-CM | POA: Diagnosis not present

## 2023-07-23 DIAGNOSIS — R278 Other lack of coordination: Secondary | ICD-10-CM | POA: Diagnosis not present

## 2023-07-23 DIAGNOSIS — G309 Alzheimer's disease, unspecified: Secondary | ICD-10-CM | POA: Diagnosis not present

## 2023-07-26 DIAGNOSIS — R278 Other lack of coordination: Secondary | ICD-10-CM | POA: Diagnosis not present

## 2023-07-26 DIAGNOSIS — M6281 Muscle weakness (generalized): Secondary | ICD-10-CM | POA: Diagnosis not present

## 2023-07-26 DIAGNOSIS — R2681 Unsteadiness on feet: Secondary | ICD-10-CM | POA: Diagnosis not present

## 2023-07-26 DIAGNOSIS — G309 Alzheimer's disease, unspecified: Secondary | ICD-10-CM | POA: Diagnosis not present

## 2023-07-26 DIAGNOSIS — R41841 Cognitive communication deficit: Secondary | ICD-10-CM | POA: Diagnosis not present

## 2023-07-26 DIAGNOSIS — R4701 Aphasia: Secondary | ICD-10-CM | POA: Diagnosis not present

## 2023-07-27 DIAGNOSIS — R2681 Unsteadiness on feet: Secondary | ICD-10-CM | POA: Diagnosis not present

## 2023-07-27 DIAGNOSIS — R278 Other lack of coordination: Secondary | ICD-10-CM | POA: Diagnosis not present

## 2023-07-27 DIAGNOSIS — R41841 Cognitive communication deficit: Secondary | ICD-10-CM | POA: Diagnosis not present

## 2023-07-27 DIAGNOSIS — R4701 Aphasia: Secondary | ICD-10-CM | POA: Diagnosis not present

## 2023-07-27 DIAGNOSIS — M6281 Muscle weakness (generalized): Secondary | ICD-10-CM | POA: Diagnosis not present

## 2023-07-27 DIAGNOSIS — G309 Alzheimer's disease, unspecified: Secondary | ICD-10-CM | POA: Diagnosis not present

## 2023-07-28 DIAGNOSIS — R41841 Cognitive communication deficit: Secondary | ICD-10-CM | POA: Diagnosis not present

## 2023-07-28 DIAGNOSIS — R278 Other lack of coordination: Secondary | ICD-10-CM | POA: Diagnosis not present

## 2023-07-28 DIAGNOSIS — R2681 Unsteadiness on feet: Secondary | ICD-10-CM | POA: Diagnosis not present

## 2023-07-28 DIAGNOSIS — G309 Alzheimer's disease, unspecified: Secondary | ICD-10-CM | POA: Diagnosis not present

## 2023-07-28 DIAGNOSIS — R4701 Aphasia: Secondary | ICD-10-CM | POA: Diagnosis not present

## 2023-07-28 DIAGNOSIS — M6281 Muscle weakness (generalized): Secondary | ICD-10-CM | POA: Diagnosis not present

## 2023-07-30 DIAGNOSIS — G309 Alzheimer's disease, unspecified: Secondary | ICD-10-CM | POA: Diagnosis not present

## 2023-07-30 DIAGNOSIS — M6281 Muscle weakness (generalized): Secondary | ICD-10-CM | POA: Diagnosis not present

## 2023-07-30 DIAGNOSIS — R278 Other lack of coordination: Secondary | ICD-10-CM | POA: Diagnosis not present

## 2023-07-30 DIAGNOSIS — R4701 Aphasia: Secondary | ICD-10-CM | POA: Diagnosis not present

## 2023-07-30 DIAGNOSIS — R41841 Cognitive communication deficit: Secondary | ICD-10-CM | POA: Diagnosis not present

## 2023-07-30 DIAGNOSIS — R2681 Unsteadiness on feet: Secondary | ICD-10-CM | POA: Diagnosis not present

## 2023-08-03 DIAGNOSIS — R41841 Cognitive communication deficit: Secondary | ICD-10-CM | POA: Diagnosis not present

## 2023-08-03 DIAGNOSIS — G309 Alzheimer's disease, unspecified: Secondary | ICD-10-CM | POA: Diagnosis not present

## 2023-08-03 DIAGNOSIS — R278 Other lack of coordination: Secondary | ICD-10-CM | POA: Diagnosis not present

## 2023-08-03 DIAGNOSIS — R2681 Unsteadiness on feet: Secondary | ICD-10-CM | POA: Diagnosis not present

## 2023-08-03 DIAGNOSIS — R4701 Aphasia: Secondary | ICD-10-CM | POA: Diagnosis not present

## 2023-08-03 DIAGNOSIS — M6281 Muscle weakness (generalized): Secondary | ICD-10-CM | POA: Diagnosis not present

## 2023-08-04 DIAGNOSIS — R4701 Aphasia: Secondary | ICD-10-CM | POA: Diagnosis not present

## 2023-08-04 DIAGNOSIS — M6281 Muscle weakness (generalized): Secondary | ICD-10-CM | POA: Diagnosis not present

## 2023-08-04 DIAGNOSIS — R2681 Unsteadiness on feet: Secondary | ICD-10-CM | POA: Diagnosis not present

## 2023-08-04 DIAGNOSIS — G309 Alzheimer's disease, unspecified: Secondary | ICD-10-CM | POA: Diagnosis not present

## 2023-08-04 DIAGNOSIS — R41841 Cognitive communication deficit: Secondary | ICD-10-CM | POA: Diagnosis not present

## 2023-08-04 DIAGNOSIS — R278 Other lack of coordination: Secondary | ICD-10-CM | POA: Diagnosis not present

## 2023-08-05 ENCOUNTER — Non-Acute Institutional Stay: Payer: Self-pay | Admitting: Internal Medicine

## 2023-08-05 ENCOUNTER — Encounter: Payer: Self-pay | Admitting: Internal Medicine

## 2023-08-05 DIAGNOSIS — G8929 Other chronic pain: Secondary | ICD-10-CM | POA: Diagnosis not present

## 2023-08-05 DIAGNOSIS — I10 Essential (primary) hypertension: Secondary | ICD-10-CM

## 2023-08-05 DIAGNOSIS — N1832 Chronic kidney disease, stage 3b: Secondary | ICD-10-CM | POA: Diagnosis not present

## 2023-08-05 DIAGNOSIS — F0280 Dementia in other diseases classified elsewhere without behavioral disturbance: Secondary | ICD-10-CM

## 2023-08-05 DIAGNOSIS — M6281 Muscle weakness (generalized): Secondary | ICD-10-CM | POA: Diagnosis not present

## 2023-08-05 DIAGNOSIS — R41841 Cognitive communication deficit: Secondary | ICD-10-CM | POA: Diagnosis not present

## 2023-08-05 DIAGNOSIS — M546 Pain in thoracic spine: Secondary | ICD-10-CM | POA: Diagnosis not present

## 2023-08-05 DIAGNOSIS — R600 Localized edema: Secondary | ICD-10-CM | POA: Diagnosis not present

## 2023-08-05 DIAGNOSIS — F0150 Vascular dementia without behavioral disturbance: Secondary | ICD-10-CM

## 2023-08-05 DIAGNOSIS — G309 Alzheimer's disease, unspecified: Secondary | ICD-10-CM | POA: Diagnosis not present

## 2023-08-05 DIAGNOSIS — R2681 Unsteadiness on feet: Secondary | ICD-10-CM | POA: Diagnosis not present

## 2023-08-05 DIAGNOSIS — M810 Age-related osteoporosis without current pathological fracture: Secondary | ICD-10-CM | POA: Diagnosis not present

## 2023-08-05 DIAGNOSIS — R4701 Aphasia: Secondary | ICD-10-CM | POA: Diagnosis not present

## 2023-08-05 DIAGNOSIS — R278 Other lack of coordination: Secondary | ICD-10-CM | POA: Diagnosis not present

## 2023-08-05 NOTE — Progress Notes (Signed)
[facility] of Service:  ALF (13)  Provider:   Code Status: DNR Goals of Care:     03/12/2023   10:33 AM  Advanced Directives  Does Patient Have a Medical Advance Directive? Yes  Type of Estate agent of [place];Out of facility DNR (pink MOST or yellow form)  Does patient want to make changes to medical advance directive? No - Patient declined  Copy of Healthcare Power of Attorney in Chart? Yes - validated most recent copy scanned in chart (See row information)     Chief Complaint  Patient presents with   Care Management    HPI: Patient is a 89 y.o. female seen today for medical management of chronic diseases.   Lives in [place] in [facility]   Has h/o Dementia, HLD,Hypertension,  Diverticulitis and GERD    Per Nurses they have noticed Worsening Cognition  Needs more Cueing to do her ADLS She was in her bed this morning  Needs reminder to get up LE swelling controlled Walks with her walker Wt Readings from Last 3 Encounters:  08/05/23 148 lb (67.1 kg)  05/28/23 147 lb 6.4 oz (66.9 kg)  05/26/23 147 lb 6.4 oz (66.9 kg)     Past Medical History:  Diagnosis Date   Anxiety    Cataract    Chest pain 02/16/2010   Myoview, negative for pharmacologic-stress induced ischemia, EF 74   Complication of anesthesia 1954   hard to wake up   Diverticulosis    DUB (dysfunctional uterine bleeding)    Dysrhythmia    h/o PVCs- none recently- no meds   Fall    GERD (gastroesophageal reflux disease)    Hearing reduced    Hypertension    no meds currently   Loss of consciousness (HCC)    Memory loss    Osteoarthritis of left hip    Osteoporosis    Palpitation 02/02/2013   A CardioNet MCOT (mobile cardiac outpatient telemetry ): NSR with PACs; sinus tachycardia with ventricular trigeminy. Brief atrial run. Facet cardiac; ventricular trigeminy and accelerated idioventricular rhythm. No significant tachyarrhythmias.   Palpitations     Postmenopausal bleeding 11/01/2014   Unsteady gait    Varicose veins of both legs with pain     Past Surgical History:  Procedure Laterality Date   APPENDECTOMY     CATARACT EXTRACTION     both eyes   CESAREAN SECTION  1962, 1963, [id number],[id number]   x 4   COLONOSCOPY  01/14/2017   DIAGNOSTIC LAPAROSCOPY     ECTOPIC PREGNANCY SURGERY  1966   FRACTURE SURGERY     HIP SURGERY Right    HYSTEROSCOPY WITH D & C N/A 11/02/2014   Procedure: DILATATION AND CURETTAGE /HYSTEROSCOPY;  Surgeon: Sherian Rein, MD;  Location: [facility];  Service: Gynecology;  Laterality: N/A;   Lower Extremity Venous Dopplers  01/2013   No thrombus or thrombophlebitis. R Greater Saphenous Vein - enlarged without other insufficiency (6 mm); right and left Small Saphenous Vein: Patent no valve insufficiency.   TONSILLECTOMY     TONSILLECTOMY AND ADENOIDECTOMY     TRANSTHORACIC ECHOCARDIOGRAM  01/2013   Normal LV size and function. EF 55-60%. Grade 1 diastolic dysfunction. Elevated LV filling pressures. No regional W. May.    Allergies  Allergen Reactions   Evista [Raloxifene]    Fosamax [Alendronate]    Penicillins    Polyoxyethylene Lauryl Ether [Sorbitan]    Moxifloxacin Rash    Outpatient Encounter  Medications as of 08/05/2023  Medication Sig   acetaminophen (TYLENOL 8 HOUR ARTHRITIS PAIN) 650 MG CR tablet Take 650 mg by mouth 2 (two) times daily.   acetaminophen (TYLENOL) 500 MG tablet Take 1,000 mg by mouth 2 (two) times daily as needed.   Calcium Carbonate (CALCIUM 600 PO) Take 1 tablet by mouth daily.   calcium carbonate (TUMS - DOSED IN MG ELEMENTAL CALCIUM) 500 MG chewable tablet Chew 1 tablet by mouth 2 (two) times daily as needed for indigestion or heartburn.   diclofenac Sodium (VOLTAREN) 1 % GEL Apply 1 g topically 3 (three) times daily.   Emollient (CERAVE) LOTN Apply 1 application. topically daily.   fluticasone (FLONASE) 50 MCG/ACT nasal spray Place 2 sprays into both nostrils daily.    memantine (NAMENDA) 10 MG tablet Take 10 mg by mouth 2 (two) times daily.   MULTIPLE VITAMIN PO Take by mouth daily.   potassium chloride SA (KLOR-CON M) 20 MEQ tablet Take 30 mEq by mouth once.   torsemide (DEMADEX) 20 MG tablet Take 40 mg by mouth daily.   No facility-administered encounter medications on file as of 08/05/2023.    Review of Systems:  Review of Systems  Unable to perform ROS: Dementia    Health Maintenance  Topic Date Due   Zoster Vaccines- Shingrix (1 of 2) 03/07/1983   INFLUENZA VACCINE  04/15/2023   COVID-19 Vaccine (7 - 2023-24 season) 05/16/2023   DEXA SCAN  11/05/2023 (Originally 03/06/1998)   Medicare Annual Wellness (AWV)  05/27/2024   DTaP/Tdap/Td (2 - Td or Tdap) 05/26/2026   Pneumonia Vaccine 29+ Years old  Completed   HPV VACCINES  Aged Out    Physical Exam: Vitals:   08/05/23 1228  BP: 106/62  Pulse: 66  Resp: 18  Temp: (!) 97 F (36.1 C)  Weight: 148 lb (67.1 kg)   Body mass index is 24.63 kg/m. Physical Exam Vitals reviewed.  Constitutional:      Appearance: Normal appearance.  HENT:     Head: Normocephalic.     Nose: Nose normal.     Mouth/Throat:     Mouth: Mucous membranes are moist.     Pharynx: Oropharynx is clear.  Eyes:     Pupils: Pupils are equal, round, and reactive to light.  Cardiovascular:     Rate and Rhythm: Normal rate and regular rhythm.     Pulses: Normal pulses.     Heart sounds: Normal heart sounds. No murmur heard. Pulmonary:     Effort: Pulmonary effort is normal.     Breath sounds: Normal breath sounds.  Abdominal:     General: Abdomen is flat. Bowel sounds are normal.     Palpations: Abdomen is soft.  Musculoskeletal:        General: Swelling present.     Cervical back: Neck supple.     Comments: But Much Better Mild Redness persists  Skin:    General: Skin is warm.  Neurological:     General: No focal deficit present.     Mental Status: She is alert.  Psychiatric:        Mood and Affect: Mood  normal.        Thought Content: Thought content normal.     Labs reviewed: Basic Metabolic Panel: Recent Labs    02/11/23 0000 02/18/23 0000 03/12/23 0000  NA 136* 136* 140  K 4.1 4.2 4.1  CL 106 103 107  CO2 24* 24* 25*  BUN 28* 27* 29*  CREATININE  0.9 1.0 1.2*  CALCIUM 9.4 9.7 8.9   Liver Function Tests: Recent Labs    11/05/22 0000 02/11/23 0000  AST 13 15  ALT 14 13  ALKPHOS 98 106  ALBUMIN 3.3* 3.7   No results for input(s): "LIPASE", "AMYLASE" in the last 8760 hours. No results for input(s): "AMMONIA" in the last 8760 hours. CBC: Recent Labs    11/05/22 0000 02/11/23 0000  WBC 5.0 5.9  NEUTROABS 2,615.00 3,404.00  HGB 11.2* 10.6*  HCT 33* 32*  PLT 243  --    Lipid Panel: No results for input(s): "CHOL", "HDL", "LDLCALC", "TRIG", "CHOLHDL", "LDLDIRECT" in the last 8760 hours. No results found for: "HGBA1C"  Procedures since last visit: No results found.  Assessment/Plan 1. Bilateral leg edema Torsemide   2. Mixed Alzheimer and vascular dementia (HCC) Doing well in AL but needs more Cueing now  3. Age-related osteoporosis without current pathological fracture Refused Therapy  4. Essential hypertension - labile No Meds  5. Chronic midline thoracic back pain Tylenol  6. Stage 3b chronic kidney disease (HCC) with Mild ANemia Will Repeat Labs    Labs/tests ordered:  CBC,CMP TSH Next appt:  Visit date not found

## 2023-08-06 DIAGNOSIS — R4701 Aphasia: Secondary | ICD-10-CM | POA: Diagnosis not present

## 2023-08-06 DIAGNOSIS — M6281 Muscle weakness (generalized): Secondary | ICD-10-CM | POA: Diagnosis not present

## 2023-08-06 DIAGNOSIS — R278 Other lack of coordination: Secondary | ICD-10-CM | POA: Diagnosis not present

## 2023-08-06 DIAGNOSIS — R2681 Unsteadiness on feet: Secondary | ICD-10-CM | POA: Diagnosis not present

## 2023-08-06 DIAGNOSIS — R41841 Cognitive communication deficit: Secondary | ICD-10-CM | POA: Diagnosis not present

## 2023-08-06 DIAGNOSIS — G309 Alzheimer's disease, unspecified: Secondary | ICD-10-CM | POA: Diagnosis not present

## 2023-08-09 DIAGNOSIS — R4701 Aphasia: Secondary | ICD-10-CM | POA: Diagnosis not present

## 2023-08-09 DIAGNOSIS — I10 Essential (primary) hypertension: Secondary | ICD-10-CM | POA: Diagnosis not present

## 2023-08-09 DIAGNOSIS — M6281 Muscle weakness (generalized): Secondary | ICD-10-CM | POA: Diagnosis not present

## 2023-08-09 DIAGNOSIS — G309 Alzheimer's disease, unspecified: Secondary | ICD-10-CM | POA: Diagnosis not present

## 2023-08-09 DIAGNOSIS — D649 Anemia, unspecified: Secondary | ICD-10-CM | POA: Diagnosis not present

## 2023-08-09 DIAGNOSIS — R278 Other lack of coordination: Secondary | ICD-10-CM | POA: Diagnosis not present

## 2023-08-09 DIAGNOSIS — E039 Hypothyroidism, unspecified: Secondary | ICD-10-CM | POA: Diagnosis not present

## 2023-08-09 DIAGNOSIS — N1832 Chronic kidney disease, stage 3b: Secondary | ICD-10-CM | POA: Diagnosis not present

## 2023-08-09 DIAGNOSIS — R2681 Unsteadiness on feet: Secondary | ICD-10-CM | POA: Diagnosis not present

## 2023-08-09 DIAGNOSIS — R41841 Cognitive communication deficit: Secondary | ICD-10-CM | POA: Diagnosis not present

## 2023-08-09 DIAGNOSIS — F0150 Vascular dementia without behavioral disturbance: Secondary | ICD-10-CM | POA: Diagnosis not present

## 2023-08-09 LAB — HEPATIC FUNCTION PANEL
ALT: 10 U/L (ref 7–35)
AST: 14 (ref 13–35)
Alkaline Phosphatase: 118 (ref 25–125)
Bilirubin, Total: 0.3

## 2023-08-09 LAB — BASIC METABOLIC PANEL WITH GFR
BUN: 34 — AB (ref 4–21)
CO2: 25 — AB (ref 13–22)
Chloride: 107 (ref 99–108)
Creatinine: 1.4 — AB (ref 0.5–1.1)
Glucose: 71
Potassium: 4.4 meq/L (ref 3.5–5.1)
Sodium: 140 (ref 137–147)

## 2023-08-09 LAB — COMPREHENSIVE METABOLIC PANEL WITH GFR
Albumin: 3.4 — AB (ref 3.5–5.0)
Calcium: 9.1 (ref 8.7–10.7)
Globulin: 2.4
eGFR: 35

## 2023-08-09 LAB — CBC AND DIFFERENTIAL
HCT: 31 — AB (ref 36–46)
Hemoglobin: 10.4 — AB (ref 12.0–16.0)
Platelets: 231 10*3/uL (ref 150–400)
WBC: 4.6

## 2023-08-09 LAB — CBC: RBC: 3.37 — AB (ref 3.87–5.11)

## 2023-08-10 DIAGNOSIS — M6281 Muscle weakness (generalized): Secondary | ICD-10-CM | POA: Diagnosis not present

## 2023-08-10 DIAGNOSIS — R278 Other lack of coordination: Secondary | ICD-10-CM | POA: Diagnosis not present

## 2023-08-10 DIAGNOSIS — R41841 Cognitive communication deficit: Secondary | ICD-10-CM | POA: Diagnosis not present

## 2023-08-10 DIAGNOSIS — G309 Alzheimer's disease, unspecified: Secondary | ICD-10-CM | POA: Diagnosis not present

## 2023-08-10 DIAGNOSIS — R2681 Unsteadiness on feet: Secondary | ICD-10-CM | POA: Diagnosis not present

## 2023-08-10 DIAGNOSIS — R4701 Aphasia: Secondary | ICD-10-CM | POA: Diagnosis not present

## 2023-08-11 DIAGNOSIS — G309 Alzheimer's disease, unspecified: Secondary | ICD-10-CM | POA: Diagnosis not present

## 2023-08-11 DIAGNOSIS — R41841 Cognitive communication deficit: Secondary | ICD-10-CM | POA: Diagnosis not present

## 2023-08-11 DIAGNOSIS — R2681 Unsteadiness on feet: Secondary | ICD-10-CM | POA: Diagnosis not present

## 2023-08-11 DIAGNOSIS — R278 Other lack of coordination: Secondary | ICD-10-CM | POA: Diagnosis not present

## 2023-08-11 DIAGNOSIS — R4701 Aphasia: Secondary | ICD-10-CM | POA: Diagnosis not present

## 2023-08-11 DIAGNOSIS — M6281 Muscle weakness (generalized): Secondary | ICD-10-CM | POA: Diagnosis not present

## 2023-08-13 DIAGNOSIS — R278 Other lack of coordination: Secondary | ICD-10-CM | POA: Diagnosis not present

## 2023-08-13 DIAGNOSIS — R2681 Unsteadiness on feet: Secondary | ICD-10-CM | POA: Diagnosis not present

## 2023-08-13 DIAGNOSIS — G309 Alzheimer's disease, unspecified: Secondary | ICD-10-CM | POA: Diagnosis not present

## 2023-08-13 DIAGNOSIS — R4701 Aphasia: Secondary | ICD-10-CM | POA: Diagnosis not present

## 2023-08-13 DIAGNOSIS — M6281 Muscle weakness (generalized): Secondary | ICD-10-CM | POA: Diagnosis not present

## 2023-08-13 DIAGNOSIS — R41841 Cognitive communication deficit: Secondary | ICD-10-CM | POA: Diagnosis not present

## 2023-08-16 DIAGNOSIS — R2681 Unsteadiness on feet: Secondary | ICD-10-CM | POA: Diagnosis not present

## 2023-08-16 DIAGNOSIS — H9190 Unspecified hearing loss, unspecified ear: Secondary | ICD-10-CM | POA: Diagnosis not present

## 2023-08-16 DIAGNOSIS — R41841 Cognitive communication deficit: Secondary | ICD-10-CM | POA: Diagnosis not present

## 2023-08-16 DIAGNOSIS — R4701 Aphasia: Secondary | ICD-10-CM | POA: Diagnosis not present

## 2023-08-16 DIAGNOSIS — G309 Alzheimer's disease, unspecified: Secondary | ICD-10-CM | POA: Diagnosis not present

## 2023-08-16 DIAGNOSIS — M25551 Pain in right hip: Secondary | ICD-10-CM | POA: Diagnosis not present

## 2023-08-16 DIAGNOSIS — R278 Other lack of coordination: Secondary | ICD-10-CM | POA: Diagnosis not present

## 2023-08-16 DIAGNOSIS — M6281 Muscle weakness (generalized): Secondary | ICD-10-CM | POA: Diagnosis not present

## 2023-08-17 DIAGNOSIS — G309 Alzheimer's disease, unspecified: Secondary | ICD-10-CM | POA: Diagnosis not present

## 2023-08-17 DIAGNOSIS — R41841 Cognitive communication deficit: Secondary | ICD-10-CM | POA: Diagnosis not present

## 2023-08-17 DIAGNOSIS — R4701 Aphasia: Secondary | ICD-10-CM | POA: Diagnosis not present

## 2023-08-17 DIAGNOSIS — M6281 Muscle weakness (generalized): Secondary | ICD-10-CM | POA: Diagnosis not present

## 2023-08-17 DIAGNOSIS — M25551 Pain in right hip: Secondary | ICD-10-CM | POA: Diagnosis not present

## 2023-08-17 DIAGNOSIS — R2681 Unsteadiness on feet: Secondary | ICD-10-CM | POA: Diagnosis not present

## 2023-08-18 DIAGNOSIS — G309 Alzheimer's disease, unspecified: Secondary | ICD-10-CM | POA: Diagnosis not present

## 2023-08-18 DIAGNOSIS — M25551 Pain in right hip: Secondary | ICD-10-CM | POA: Diagnosis not present

## 2023-08-18 DIAGNOSIS — R4701 Aphasia: Secondary | ICD-10-CM | POA: Diagnosis not present

## 2023-08-18 DIAGNOSIS — R2681 Unsteadiness on feet: Secondary | ICD-10-CM | POA: Diagnosis not present

## 2023-08-18 DIAGNOSIS — R41841 Cognitive communication deficit: Secondary | ICD-10-CM | POA: Diagnosis not present

## 2023-08-18 DIAGNOSIS — M6281 Muscle weakness (generalized): Secondary | ICD-10-CM | POA: Diagnosis not present

## 2023-08-19 DIAGNOSIS — M25551 Pain in right hip: Secondary | ICD-10-CM | POA: Diagnosis not present

## 2023-08-19 DIAGNOSIS — G309 Alzheimer's disease, unspecified: Secondary | ICD-10-CM | POA: Diagnosis not present

## 2023-08-19 DIAGNOSIS — R2681 Unsteadiness on feet: Secondary | ICD-10-CM | POA: Diagnosis not present

## 2023-08-19 DIAGNOSIS — M6281 Muscle weakness (generalized): Secondary | ICD-10-CM | POA: Diagnosis not present

## 2023-08-19 DIAGNOSIS — R4701 Aphasia: Secondary | ICD-10-CM | POA: Diagnosis not present

## 2023-08-19 DIAGNOSIS — R41841 Cognitive communication deficit: Secondary | ICD-10-CM | POA: Diagnosis not present

## 2023-08-20 DIAGNOSIS — G309 Alzheimer's disease, unspecified: Secondary | ICD-10-CM | POA: Diagnosis not present

## 2023-08-20 DIAGNOSIS — R2681 Unsteadiness on feet: Secondary | ICD-10-CM | POA: Diagnosis not present

## 2023-08-20 DIAGNOSIS — R4701 Aphasia: Secondary | ICD-10-CM | POA: Diagnosis not present

## 2023-08-20 DIAGNOSIS — R41841 Cognitive communication deficit: Secondary | ICD-10-CM | POA: Diagnosis not present

## 2023-08-20 DIAGNOSIS — M25551 Pain in right hip: Secondary | ICD-10-CM | POA: Diagnosis not present

## 2023-08-20 DIAGNOSIS — M6281 Muscle weakness (generalized): Secondary | ICD-10-CM | POA: Diagnosis not present

## 2023-08-23 DIAGNOSIS — M6281 Muscle weakness (generalized): Secondary | ICD-10-CM | POA: Diagnosis not present

## 2023-08-23 DIAGNOSIS — R2681 Unsteadiness on feet: Secondary | ICD-10-CM | POA: Diagnosis not present

## 2023-08-23 DIAGNOSIS — G309 Alzheimer's disease, unspecified: Secondary | ICD-10-CM | POA: Diagnosis not present

## 2023-08-23 DIAGNOSIS — R4701 Aphasia: Secondary | ICD-10-CM | POA: Diagnosis not present

## 2023-08-23 DIAGNOSIS — M25551 Pain in right hip: Secondary | ICD-10-CM | POA: Diagnosis not present

## 2023-08-23 DIAGNOSIS — R41841 Cognitive communication deficit: Secondary | ICD-10-CM | POA: Diagnosis not present

## 2023-08-25 DIAGNOSIS — G309 Alzheimer's disease, unspecified: Secondary | ICD-10-CM | POA: Diagnosis not present

## 2023-08-25 DIAGNOSIS — R2681 Unsteadiness on feet: Secondary | ICD-10-CM | POA: Diagnosis not present

## 2023-08-25 DIAGNOSIS — R4701 Aphasia: Secondary | ICD-10-CM | POA: Diagnosis not present

## 2023-08-25 DIAGNOSIS — R41841 Cognitive communication deficit: Secondary | ICD-10-CM | POA: Diagnosis not present

## 2023-08-25 DIAGNOSIS — M25551 Pain in right hip: Secondary | ICD-10-CM | POA: Diagnosis not present

## 2023-08-25 DIAGNOSIS — M6281 Muscle weakness (generalized): Secondary | ICD-10-CM | POA: Diagnosis not present

## 2023-08-26 DIAGNOSIS — M6281 Muscle weakness (generalized): Secondary | ICD-10-CM | POA: Diagnosis not present

## 2023-08-26 DIAGNOSIS — R41841 Cognitive communication deficit: Secondary | ICD-10-CM | POA: Diagnosis not present

## 2023-08-26 DIAGNOSIS — R4701 Aphasia: Secondary | ICD-10-CM | POA: Diagnosis not present

## 2023-08-26 DIAGNOSIS — R2681 Unsteadiness on feet: Secondary | ICD-10-CM | POA: Diagnosis not present

## 2023-08-26 DIAGNOSIS — M25551 Pain in right hip: Secondary | ICD-10-CM | POA: Diagnosis not present

## 2023-08-26 DIAGNOSIS — G309 Alzheimer's disease, unspecified: Secondary | ICD-10-CM | POA: Diagnosis not present

## 2023-08-30 DIAGNOSIS — M25551 Pain in right hip: Secondary | ICD-10-CM | POA: Diagnosis not present

## 2023-08-30 DIAGNOSIS — M6281 Muscle weakness (generalized): Secondary | ICD-10-CM | POA: Diagnosis not present

## 2023-08-30 DIAGNOSIS — R4701 Aphasia: Secondary | ICD-10-CM | POA: Diagnosis not present

## 2023-08-30 DIAGNOSIS — R41841 Cognitive communication deficit: Secondary | ICD-10-CM | POA: Diagnosis not present

## 2023-08-30 DIAGNOSIS — R2681 Unsteadiness on feet: Secondary | ICD-10-CM | POA: Diagnosis not present

## 2023-08-30 DIAGNOSIS — G309 Alzheimer's disease, unspecified: Secondary | ICD-10-CM | POA: Diagnosis not present

## 2023-08-31 DIAGNOSIS — G309 Alzheimer's disease, unspecified: Secondary | ICD-10-CM | POA: Diagnosis not present

## 2023-08-31 DIAGNOSIS — M25551 Pain in right hip: Secondary | ICD-10-CM | POA: Diagnosis not present

## 2023-08-31 DIAGNOSIS — R2681 Unsteadiness on feet: Secondary | ICD-10-CM | POA: Diagnosis not present

## 2023-08-31 DIAGNOSIS — M6281 Muscle weakness (generalized): Secondary | ICD-10-CM | POA: Diagnosis not present

## 2023-08-31 DIAGNOSIS — R4701 Aphasia: Secondary | ICD-10-CM | POA: Diagnosis not present

## 2023-08-31 DIAGNOSIS — R41841 Cognitive communication deficit: Secondary | ICD-10-CM | POA: Diagnosis not present

## 2023-09-01 DIAGNOSIS — G309 Alzheimer's disease, unspecified: Secondary | ICD-10-CM | POA: Diagnosis not present

## 2023-09-01 DIAGNOSIS — M25551 Pain in right hip: Secondary | ICD-10-CM | POA: Diagnosis not present

## 2023-09-01 DIAGNOSIS — R4701 Aphasia: Secondary | ICD-10-CM | POA: Diagnosis not present

## 2023-09-01 DIAGNOSIS — R41841 Cognitive communication deficit: Secondary | ICD-10-CM | POA: Diagnosis not present

## 2023-09-01 DIAGNOSIS — M6281 Muscle weakness (generalized): Secondary | ICD-10-CM | POA: Diagnosis not present

## 2023-09-01 DIAGNOSIS — R2681 Unsteadiness on feet: Secondary | ICD-10-CM | POA: Diagnosis not present

## 2023-09-02 DIAGNOSIS — R41841 Cognitive communication deficit: Secondary | ICD-10-CM | POA: Diagnosis not present

## 2023-09-02 DIAGNOSIS — R4701 Aphasia: Secondary | ICD-10-CM | POA: Diagnosis not present

## 2023-09-02 DIAGNOSIS — R2681 Unsteadiness on feet: Secondary | ICD-10-CM | POA: Diagnosis not present

## 2023-09-02 DIAGNOSIS — M25551 Pain in right hip: Secondary | ICD-10-CM | POA: Diagnosis not present

## 2023-09-02 DIAGNOSIS — G309 Alzheimer's disease, unspecified: Secondary | ICD-10-CM | POA: Diagnosis not present

## 2023-09-02 DIAGNOSIS — M6281 Muscle weakness (generalized): Secondary | ICD-10-CM | POA: Diagnosis not present

## 2023-09-03 DIAGNOSIS — M6281 Muscle weakness (generalized): Secondary | ICD-10-CM | POA: Diagnosis not present

## 2023-09-03 DIAGNOSIS — G309 Alzheimer's disease, unspecified: Secondary | ICD-10-CM | POA: Diagnosis not present

## 2023-09-03 DIAGNOSIS — M25551 Pain in right hip: Secondary | ICD-10-CM | POA: Diagnosis not present

## 2023-09-03 DIAGNOSIS — R2681 Unsteadiness on feet: Secondary | ICD-10-CM | POA: Diagnosis not present

## 2023-09-03 DIAGNOSIS — R4701 Aphasia: Secondary | ICD-10-CM | POA: Diagnosis not present

## 2023-09-03 DIAGNOSIS — R41841 Cognitive communication deficit: Secondary | ICD-10-CM | POA: Diagnosis not present

## 2023-09-06 DIAGNOSIS — M6281 Muscle weakness (generalized): Secondary | ICD-10-CM | POA: Diagnosis not present

## 2023-09-06 DIAGNOSIS — R41841 Cognitive communication deficit: Secondary | ICD-10-CM | POA: Diagnosis not present

## 2023-09-06 DIAGNOSIS — R2681 Unsteadiness on feet: Secondary | ICD-10-CM | POA: Diagnosis not present

## 2023-09-06 DIAGNOSIS — R4701 Aphasia: Secondary | ICD-10-CM | POA: Diagnosis not present

## 2023-09-06 DIAGNOSIS — G309 Alzheimer's disease, unspecified: Secondary | ICD-10-CM | POA: Diagnosis not present

## 2023-09-06 DIAGNOSIS — M25551 Pain in right hip: Secondary | ICD-10-CM | POA: Diagnosis not present

## 2023-09-09 DIAGNOSIS — R2681 Unsteadiness on feet: Secondary | ICD-10-CM | POA: Diagnosis not present

## 2023-09-09 DIAGNOSIS — M6281 Muscle weakness (generalized): Secondary | ICD-10-CM | POA: Diagnosis not present

## 2023-09-09 DIAGNOSIS — R4701 Aphasia: Secondary | ICD-10-CM | POA: Diagnosis not present

## 2023-09-09 DIAGNOSIS — M25551 Pain in right hip: Secondary | ICD-10-CM | POA: Diagnosis not present

## 2023-09-09 DIAGNOSIS — G309 Alzheimer's disease, unspecified: Secondary | ICD-10-CM | POA: Diagnosis not present

## 2023-09-09 DIAGNOSIS — R41841 Cognitive communication deficit: Secondary | ICD-10-CM | POA: Diagnosis not present

## 2023-09-10 DIAGNOSIS — G309 Alzheimer's disease, unspecified: Secondary | ICD-10-CM | POA: Diagnosis not present

## 2023-09-10 DIAGNOSIS — M25551 Pain in right hip: Secondary | ICD-10-CM | POA: Diagnosis not present

## 2023-09-10 DIAGNOSIS — M6281 Muscle weakness (generalized): Secondary | ICD-10-CM | POA: Diagnosis not present

## 2023-09-10 DIAGNOSIS — R2681 Unsteadiness on feet: Secondary | ICD-10-CM | POA: Diagnosis not present

## 2023-09-10 DIAGNOSIS — R41841 Cognitive communication deficit: Secondary | ICD-10-CM | POA: Diagnosis not present

## 2023-09-10 DIAGNOSIS — R4701 Aphasia: Secondary | ICD-10-CM | POA: Diagnosis not present

## 2023-09-13 DIAGNOSIS — R2681 Unsteadiness on feet: Secondary | ICD-10-CM | POA: Diagnosis not present

## 2023-09-13 DIAGNOSIS — M25551 Pain in right hip: Secondary | ICD-10-CM | POA: Diagnosis not present

## 2023-09-13 DIAGNOSIS — R4701 Aphasia: Secondary | ICD-10-CM | POA: Diagnosis not present

## 2023-09-13 DIAGNOSIS — R41841 Cognitive communication deficit: Secondary | ICD-10-CM | POA: Diagnosis not present

## 2023-09-13 DIAGNOSIS — G309 Alzheimer's disease, unspecified: Secondary | ICD-10-CM | POA: Diagnosis not present

## 2023-09-13 DIAGNOSIS — M6281 Muscle weakness (generalized): Secondary | ICD-10-CM | POA: Diagnosis not present

## 2023-09-13 NOTE — Addendum Note (Signed)
Addended by: Cephus Richer on: 09/13/2023 08:05 PM   Modules accepted: Level of Service

## 2023-09-14 DIAGNOSIS — M25551 Pain in right hip: Secondary | ICD-10-CM | POA: Diagnosis not present

## 2023-09-14 DIAGNOSIS — R4701 Aphasia: Secondary | ICD-10-CM | POA: Diagnosis not present

## 2023-09-14 DIAGNOSIS — G309 Alzheimer's disease, unspecified: Secondary | ICD-10-CM | POA: Diagnosis not present

## 2023-09-14 DIAGNOSIS — M6281 Muscle weakness (generalized): Secondary | ICD-10-CM | POA: Diagnosis not present

## 2023-09-14 DIAGNOSIS — R2681 Unsteadiness on feet: Secondary | ICD-10-CM | POA: Diagnosis not present

## 2023-09-14 DIAGNOSIS — R41841 Cognitive communication deficit: Secondary | ICD-10-CM | POA: Diagnosis not present

## 2023-09-16 DIAGNOSIS — R4701 Aphasia: Secondary | ICD-10-CM | POA: Diagnosis not present

## 2023-09-16 DIAGNOSIS — R2681 Unsteadiness on feet: Secondary | ICD-10-CM | POA: Diagnosis not present

## 2023-09-16 DIAGNOSIS — H9190 Unspecified hearing loss, unspecified ear: Secondary | ICD-10-CM | POA: Diagnosis not present

## 2023-09-16 DIAGNOSIS — R41841 Cognitive communication deficit: Secondary | ICD-10-CM | POA: Diagnosis not present

## 2023-09-16 DIAGNOSIS — G309 Alzheimer's disease, unspecified: Secondary | ICD-10-CM | POA: Diagnosis not present

## 2023-09-16 DIAGNOSIS — M25551 Pain in right hip: Secondary | ICD-10-CM | POA: Diagnosis not present

## 2023-09-16 DIAGNOSIS — R278 Other lack of coordination: Secondary | ICD-10-CM | POA: Diagnosis not present

## 2023-09-16 DIAGNOSIS — M6281 Muscle weakness (generalized): Secondary | ICD-10-CM | POA: Diagnosis not present

## 2023-09-17 DIAGNOSIS — G309 Alzheimer's disease, unspecified: Secondary | ICD-10-CM | POA: Diagnosis not present

## 2023-09-17 DIAGNOSIS — M25551 Pain in right hip: Secondary | ICD-10-CM | POA: Diagnosis not present

## 2023-09-17 DIAGNOSIS — R2681 Unsteadiness on feet: Secondary | ICD-10-CM | POA: Diagnosis not present

## 2023-09-17 DIAGNOSIS — R4701 Aphasia: Secondary | ICD-10-CM | POA: Diagnosis not present

## 2023-09-17 DIAGNOSIS — M6281 Muscle weakness (generalized): Secondary | ICD-10-CM | POA: Diagnosis not present

## 2023-09-17 DIAGNOSIS — R41841 Cognitive communication deficit: Secondary | ICD-10-CM | POA: Diagnosis not present

## 2023-09-20 DIAGNOSIS — M6281 Muscle weakness (generalized): Secondary | ICD-10-CM | POA: Diagnosis not present

## 2023-09-20 DIAGNOSIS — M25551 Pain in right hip: Secondary | ICD-10-CM | POA: Diagnosis not present

## 2023-09-20 DIAGNOSIS — R4701 Aphasia: Secondary | ICD-10-CM | POA: Diagnosis not present

## 2023-09-20 DIAGNOSIS — R2681 Unsteadiness on feet: Secondary | ICD-10-CM | POA: Diagnosis not present

## 2023-09-20 DIAGNOSIS — R41841 Cognitive communication deficit: Secondary | ICD-10-CM | POA: Diagnosis not present

## 2023-09-20 DIAGNOSIS — G309 Alzheimer's disease, unspecified: Secondary | ICD-10-CM | POA: Diagnosis not present

## 2023-09-21 DIAGNOSIS — R41841 Cognitive communication deficit: Secondary | ICD-10-CM | POA: Diagnosis not present

## 2023-09-21 DIAGNOSIS — R4701 Aphasia: Secondary | ICD-10-CM | POA: Diagnosis not present

## 2023-09-21 DIAGNOSIS — M6281 Muscle weakness (generalized): Secondary | ICD-10-CM | POA: Diagnosis not present

## 2023-09-21 DIAGNOSIS — R2681 Unsteadiness on feet: Secondary | ICD-10-CM | POA: Diagnosis not present

## 2023-09-21 DIAGNOSIS — G309 Alzheimer's disease, unspecified: Secondary | ICD-10-CM | POA: Diagnosis not present

## 2023-09-21 DIAGNOSIS — M25551 Pain in right hip: Secondary | ICD-10-CM | POA: Diagnosis not present

## 2023-09-22 DIAGNOSIS — G309 Alzheimer's disease, unspecified: Secondary | ICD-10-CM | POA: Diagnosis not present

## 2023-09-22 DIAGNOSIS — R41841 Cognitive communication deficit: Secondary | ICD-10-CM | POA: Diagnosis not present

## 2023-09-22 DIAGNOSIS — R4701 Aphasia: Secondary | ICD-10-CM | POA: Diagnosis not present

## 2023-09-22 DIAGNOSIS — R2681 Unsteadiness on feet: Secondary | ICD-10-CM | POA: Diagnosis not present

## 2023-09-22 DIAGNOSIS — M6281 Muscle weakness (generalized): Secondary | ICD-10-CM | POA: Diagnosis not present

## 2023-09-22 DIAGNOSIS — M25551 Pain in right hip: Secondary | ICD-10-CM | POA: Diagnosis not present

## 2023-09-23 DIAGNOSIS — R2681 Unsteadiness on feet: Secondary | ICD-10-CM | POA: Diagnosis not present

## 2023-09-23 DIAGNOSIS — M25551 Pain in right hip: Secondary | ICD-10-CM | POA: Diagnosis not present

## 2023-09-23 DIAGNOSIS — R4701 Aphasia: Secondary | ICD-10-CM | POA: Diagnosis not present

## 2023-09-23 DIAGNOSIS — R41841 Cognitive communication deficit: Secondary | ICD-10-CM | POA: Diagnosis not present

## 2023-09-23 DIAGNOSIS — M6281 Muscle weakness (generalized): Secondary | ICD-10-CM | POA: Diagnosis not present

## 2023-09-23 DIAGNOSIS — G309 Alzheimer's disease, unspecified: Secondary | ICD-10-CM | POA: Diagnosis not present

## 2023-09-24 DIAGNOSIS — R2681 Unsteadiness on feet: Secondary | ICD-10-CM | POA: Diagnosis not present

## 2023-09-24 DIAGNOSIS — M25551 Pain in right hip: Secondary | ICD-10-CM | POA: Diagnosis not present

## 2023-09-24 DIAGNOSIS — M6281 Muscle weakness (generalized): Secondary | ICD-10-CM | POA: Diagnosis not present

## 2023-09-24 DIAGNOSIS — R41841 Cognitive communication deficit: Secondary | ICD-10-CM | POA: Diagnosis not present

## 2023-09-24 DIAGNOSIS — R4701 Aphasia: Secondary | ICD-10-CM | POA: Diagnosis not present

## 2023-09-24 DIAGNOSIS — G309 Alzheimer's disease, unspecified: Secondary | ICD-10-CM | POA: Diagnosis not present

## 2023-09-27 DIAGNOSIS — R41841 Cognitive communication deficit: Secondary | ICD-10-CM | POA: Diagnosis not present

## 2023-09-27 DIAGNOSIS — R4701 Aphasia: Secondary | ICD-10-CM | POA: Diagnosis not present

## 2023-09-27 DIAGNOSIS — M6281 Muscle weakness (generalized): Secondary | ICD-10-CM | POA: Diagnosis not present

## 2023-09-27 DIAGNOSIS — R2681 Unsteadiness on feet: Secondary | ICD-10-CM | POA: Diagnosis not present

## 2023-09-27 DIAGNOSIS — G309 Alzheimer's disease, unspecified: Secondary | ICD-10-CM | POA: Diagnosis not present

## 2023-09-27 DIAGNOSIS — M25551 Pain in right hip: Secondary | ICD-10-CM | POA: Diagnosis not present

## 2023-09-28 DIAGNOSIS — R4701 Aphasia: Secondary | ICD-10-CM | POA: Diagnosis not present

## 2023-09-28 DIAGNOSIS — R2681 Unsteadiness on feet: Secondary | ICD-10-CM | POA: Diagnosis not present

## 2023-09-28 DIAGNOSIS — G309 Alzheimer's disease, unspecified: Secondary | ICD-10-CM | POA: Diagnosis not present

## 2023-09-28 DIAGNOSIS — M25551 Pain in right hip: Secondary | ICD-10-CM | POA: Diagnosis not present

## 2023-09-28 DIAGNOSIS — R41841 Cognitive communication deficit: Secondary | ICD-10-CM | POA: Diagnosis not present

## 2023-09-28 DIAGNOSIS — M6281 Muscle weakness (generalized): Secondary | ICD-10-CM | POA: Diagnosis not present

## 2023-09-29 DIAGNOSIS — M25551 Pain in right hip: Secondary | ICD-10-CM | POA: Diagnosis not present

## 2023-09-29 DIAGNOSIS — G309 Alzheimer's disease, unspecified: Secondary | ICD-10-CM | POA: Diagnosis not present

## 2023-09-29 DIAGNOSIS — R41841 Cognitive communication deficit: Secondary | ICD-10-CM | POA: Diagnosis not present

## 2023-09-29 DIAGNOSIS — R2681 Unsteadiness on feet: Secondary | ICD-10-CM | POA: Diagnosis not present

## 2023-09-29 DIAGNOSIS — M6281 Muscle weakness (generalized): Secondary | ICD-10-CM | POA: Diagnosis not present

## 2023-09-29 DIAGNOSIS — R4701 Aphasia: Secondary | ICD-10-CM | POA: Diagnosis not present

## 2023-09-30 DIAGNOSIS — R4701 Aphasia: Secondary | ICD-10-CM | POA: Diagnosis not present

## 2023-09-30 DIAGNOSIS — R2681 Unsteadiness on feet: Secondary | ICD-10-CM | POA: Diagnosis not present

## 2023-09-30 DIAGNOSIS — R41841 Cognitive communication deficit: Secondary | ICD-10-CM | POA: Diagnosis not present

## 2023-09-30 DIAGNOSIS — M6281 Muscle weakness (generalized): Secondary | ICD-10-CM | POA: Diagnosis not present

## 2023-09-30 DIAGNOSIS — M25551 Pain in right hip: Secondary | ICD-10-CM | POA: Diagnosis not present

## 2023-09-30 DIAGNOSIS — G309 Alzheimer's disease, unspecified: Secondary | ICD-10-CM | POA: Diagnosis not present

## 2023-10-01 DIAGNOSIS — R2681 Unsteadiness on feet: Secondary | ICD-10-CM | POA: Diagnosis not present

## 2023-10-01 DIAGNOSIS — R4701 Aphasia: Secondary | ICD-10-CM | POA: Diagnosis not present

## 2023-10-01 DIAGNOSIS — M25551 Pain in right hip: Secondary | ICD-10-CM | POA: Diagnosis not present

## 2023-10-01 DIAGNOSIS — R41841 Cognitive communication deficit: Secondary | ICD-10-CM | POA: Diagnosis not present

## 2023-10-01 DIAGNOSIS — G309 Alzheimer's disease, unspecified: Secondary | ICD-10-CM | POA: Diagnosis not present

## 2023-10-01 DIAGNOSIS — M6281 Muscle weakness (generalized): Secondary | ICD-10-CM | POA: Diagnosis not present

## 2023-10-04 DIAGNOSIS — R4701 Aphasia: Secondary | ICD-10-CM | POA: Diagnosis not present

## 2023-10-04 DIAGNOSIS — M6281 Muscle weakness (generalized): Secondary | ICD-10-CM | POA: Diagnosis not present

## 2023-10-04 DIAGNOSIS — R41841 Cognitive communication deficit: Secondary | ICD-10-CM | POA: Diagnosis not present

## 2023-10-04 DIAGNOSIS — M25551 Pain in right hip: Secondary | ICD-10-CM | POA: Diagnosis not present

## 2023-10-04 DIAGNOSIS — R2681 Unsteadiness on feet: Secondary | ICD-10-CM | POA: Diagnosis not present

## 2023-10-04 DIAGNOSIS — G309 Alzheimer's disease, unspecified: Secondary | ICD-10-CM | POA: Diagnosis not present

## 2023-10-05 DIAGNOSIS — R2681 Unsteadiness on feet: Secondary | ICD-10-CM | POA: Diagnosis not present

## 2023-10-05 DIAGNOSIS — R4701 Aphasia: Secondary | ICD-10-CM | POA: Diagnosis not present

## 2023-10-05 DIAGNOSIS — G309 Alzheimer's disease, unspecified: Secondary | ICD-10-CM | POA: Diagnosis not present

## 2023-10-05 DIAGNOSIS — R41841 Cognitive communication deficit: Secondary | ICD-10-CM | POA: Diagnosis not present

## 2023-10-05 DIAGNOSIS — M25551 Pain in right hip: Secondary | ICD-10-CM | POA: Diagnosis not present

## 2023-10-05 DIAGNOSIS — M6281 Muscle weakness (generalized): Secondary | ICD-10-CM | POA: Diagnosis not present

## 2023-10-06 DIAGNOSIS — M6281 Muscle weakness (generalized): Secondary | ICD-10-CM | POA: Diagnosis not present

## 2023-10-06 DIAGNOSIS — M25551 Pain in right hip: Secondary | ICD-10-CM | POA: Diagnosis not present

## 2023-10-06 DIAGNOSIS — R41841 Cognitive communication deficit: Secondary | ICD-10-CM | POA: Diagnosis not present

## 2023-10-06 DIAGNOSIS — G309 Alzheimer's disease, unspecified: Secondary | ICD-10-CM | POA: Diagnosis not present

## 2023-10-06 DIAGNOSIS — R4701 Aphasia: Secondary | ICD-10-CM | POA: Diagnosis not present

## 2023-10-06 DIAGNOSIS — R2681 Unsteadiness on feet: Secondary | ICD-10-CM | POA: Diagnosis not present

## 2023-10-07 DIAGNOSIS — R4701 Aphasia: Secondary | ICD-10-CM | POA: Diagnosis not present

## 2023-10-07 DIAGNOSIS — G309 Alzheimer's disease, unspecified: Secondary | ICD-10-CM | POA: Diagnosis not present

## 2023-10-07 DIAGNOSIS — M25551 Pain in right hip: Secondary | ICD-10-CM | POA: Diagnosis not present

## 2023-10-07 DIAGNOSIS — M6281 Muscle weakness (generalized): Secondary | ICD-10-CM | POA: Diagnosis not present

## 2023-10-07 DIAGNOSIS — R41841 Cognitive communication deficit: Secondary | ICD-10-CM | POA: Diagnosis not present

## 2023-10-07 DIAGNOSIS — R2681 Unsteadiness on feet: Secondary | ICD-10-CM | POA: Diagnosis not present

## 2023-10-08 DIAGNOSIS — M25551 Pain in right hip: Secondary | ICD-10-CM | POA: Diagnosis not present

## 2023-10-08 DIAGNOSIS — R41841 Cognitive communication deficit: Secondary | ICD-10-CM | POA: Diagnosis not present

## 2023-10-08 DIAGNOSIS — M6281 Muscle weakness (generalized): Secondary | ICD-10-CM | POA: Diagnosis not present

## 2023-10-08 DIAGNOSIS — R2681 Unsteadiness on feet: Secondary | ICD-10-CM | POA: Diagnosis not present

## 2023-10-08 DIAGNOSIS — G309 Alzheimer's disease, unspecified: Secondary | ICD-10-CM | POA: Diagnosis not present

## 2023-10-08 DIAGNOSIS — R4701 Aphasia: Secondary | ICD-10-CM | POA: Diagnosis not present

## 2023-10-11 DIAGNOSIS — M25551 Pain in right hip: Secondary | ICD-10-CM | POA: Diagnosis not present

## 2023-10-11 DIAGNOSIS — M6281 Muscle weakness (generalized): Secondary | ICD-10-CM | POA: Diagnosis not present

## 2023-10-11 DIAGNOSIS — R4701 Aphasia: Secondary | ICD-10-CM | POA: Diagnosis not present

## 2023-10-11 DIAGNOSIS — R41841 Cognitive communication deficit: Secondary | ICD-10-CM | POA: Diagnosis not present

## 2023-10-11 DIAGNOSIS — G309 Alzheimer's disease, unspecified: Secondary | ICD-10-CM | POA: Diagnosis not present

## 2023-10-11 DIAGNOSIS — R2681 Unsteadiness on feet: Secondary | ICD-10-CM | POA: Diagnosis not present

## 2023-10-12 DIAGNOSIS — M6281 Muscle weakness (generalized): Secondary | ICD-10-CM | POA: Diagnosis not present

## 2023-10-12 DIAGNOSIS — R4701 Aphasia: Secondary | ICD-10-CM | POA: Diagnosis not present

## 2023-10-12 DIAGNOSIS — G309 Alzheimer's disease, unspecified: Secondary | ICD-10-CM | POA: Diagnosis not present

## 2023-10-12 DIAGNOSIS — R41841 Cognitive communication deficit: Secondary | ICD-10-CM | POA: Diagnosis not present

## 2023-10-12 DIAGNOSIS — M25551 Pain in right hip: Secondary | ICD-10-CM | POA: Diagnosis not present

## 2023-10-12 DIAGNOSIS — R2681 Unsteadiness on feet: Secondary | ICD-10-CM | POA: Diagnosis not present

## 2023-10-13 DIAGNOSIS — M25551 Pain in right hip: Secondary | ICD-10-CM | POA: Diagnosis not present

## 2023-10-13 DIAGNOSIS — R41841 Cognitive communication deficit: Secondary | ICD-10-CM | POA: Diagnosis not present

## 2023-10-13 DIAGNOSIS — M6281 Muscle weakness (generalized): Secondary | ICD-10-CM | POA: Diagnosis not present

## 2023-10-13 DIAGNOSIS — R4701 Aphasia: Secondary | ICD-10-CM | POA: Diagnosis not present

## 2023-10-13 DIAGNOSIS — G309 Alzheimer's disease, unspecified: Secondary | ICD-10-CM | POA: Diagnosis not present

## 2023-10-13 DIAGNOSIS — R2681 Unsteadiness on feet: Secondary | ICD-10-CM | POA: Diagnosis not present

## 2023-10-14 DIAGNOSIS — M25551 Pain in right hip: Secondary | ICD-10-CM | POA: Diagnosis not present

## 2023-10-14 DIAGNOSIS — R4701 Aphasia: Secondary | ICD-10-CM | POA: Diagnosis not present

## 2023-10-14 DIAGNOSIS — R2681 Unsteadiness on feet: Secondary | ICD-10-CM | POA: Diagnosis not present

## 2023-10-14 DIAGNOSIS — M6281 Muscle weakness (generalized): Secondary | ICD-10-CM | POA: Diagnosis not present

## 2023-10-14 DIAGNOSIS — G309 Alzheimer's disease, unspecified: Secondary | ICD-10-CM | POA: Diagnosis not present

## 2023-10-14 DIAGNOSIS — R41841 Cognitive communication deficit: Secondary | ICD-10-CM | POA: Diagnosis not present

## 2023-10-15 DIAGNOSIS — R2681 Unsteadiness on feet: Secondary | ICD-10-CM | POA: Diagnosis not present

## 2023-10-15 DIAGNOSIS — R4701 Aphasia: Secondary | ICD-10-CM | POA: Diagnosis not present

## 2023-10-15 DIAGNOSIS — M6281 Muscle weakness (generalized): Secondary | ICD-10-CM | POA: Diagnosis not present

## 2023-10-15 DIAGNOSIS — M25551 Pain in right hip: Secondary | ICD-10-CM | POA: Diagnosis not present

## 2023-10-15 DIAGNOSIS — G309 Alzheimer's disease, unspecified: Secondary | ICD-10-CM | POA: Diagnosis not present

## 2023-10-15 DIAGNOSIS — R41841 Cognitive communication deficit: Secondary | ICD-10-CM | POA: Diagnosis not present

## 2023-10-18 DIAGNOSIS — H9190 Unspecified hearing loss, unspecified ear: Secondary | ICD-10-CM | POA: Diagnosis not present

## 2023-10-18 DIAGNOSIS — R4701 Aphasia: Secondary | ICD-10-CM | POA: Diagnosis not present

## 2023-10-18 DIAGNOSIS — R2681 Unsteadiness on feet: Secondary | ICD-10-CM | POA: Diagnosis not present

## 2023-10-18 DIAGNOSIS — R278 Other lack of coordination: Secondary | ICD-10-CM | POA: Diagnosis not present

## 2023-10-18 DIAGNOSIS — M25551 Pain in right hip: Secondary | ICD-10-CM | POA: Diagnosis not present

## 2023-10-18 DIAGNOSIS — G309 Alzheimer's disease, unspecified: Secondary | ICD-10-CM | POA: Diagnosis not present

## 2023-10-18 DIAGNOSIS — M6281 Muscle weakness (generalized): Secondary | ICD-10-CM | POA: Diagnosis not present

## 2023-10-18 DIAGNOSIS — R41841 Cognitive communication deficit: Secondary | ICD-10-CM | POA: Diagnosis not present

## 2023-10-19 DIAGNOSIS — G309 Alzheimer's disease, unspecified: Secondary | ICD-10-CM | POA: Diagnosis not present

## 2023-10-19 DIAGNOSIS — R4701 Aphasia: Secondary | ICD-10-CM | POA: Diagnosis not present

## 2023-10-19 DIAGNOSIS — R2681 Unsteadiness on feet: Secondary | ICD-10-CM | POA: Diagnosis not present

## 2023-10-19 DIAGNOSIS — M25551 Pain in right hip: Secondary | ICD-10-CM | POA: Diagnosis not present

## 2023-10-19 DIAGNOSIS — R41841 Cognitive communication deficit: Secondary | ICD-10-CM | POA: Diagnosis not present

## 2023-10-19 DIAGNOSIS — M6281 Muscle weakness (generalized): Secondary | ICD-10-CM | POA: Diagnosis not present

## 2023-10-20 DIAGNOSIS — M25551 Pain in right hip: Secondary | ICD-10-CM | POA: Diagnosis not present

## 2023-10-20 DIAGNOSIS — M6281 Muscle weakness (generalized): Secondary | ICD-10-CM | POA: Diagnosis not present

## 2023-10-20 DIAGNOSIS — R2681 Unsteadiness on feet: Secondary | ICD-10-CM | POA: Diagnosis not present

## 2023-10-20 DIAGNOSIS — R41841 Cognitive communication deficit: Secondary | ICD-10-CM | POA: Diagnosis not present

## 2023-10-20 DIAGNOSIS — R4701 Aphasia: Secondary | ICD-10-CM | POA: Diagnosis not present

## 2023-10-20 DIAGNOSIS — G309 Alzheimer's disease, unspecified: Secondary | ICD-10-CM | POA: Diagnosis not present

## 2023-10-21 DIAGNOSIS — M25551 Pain in right hip: Secondary | ICD-10-CM | POA: Diagnosis not present

## 2023-10-21 DIAGNOSIS — G309 Alzheimer's disease, unspecified: Secondary | ICD-10-CM | POA: Diagnosis not present

## 2023-10-21 DIAGNOSIS — R41841 Cognitive communication deficit: Secondary | ICD-10-CM | POA: Diagnosis not present

## 2023-10-21 DIAGNOSIS — R2681 Unsteadiness on feet: Secondary | ICD-10-CM | POA: Diagnosis not present

## 2023-10-21 DIAGNOSIS — M6281 Muscle weakness (generalized): Secondary | ICD-10-CM | POA: Diagnosis not present

## 2023-10-21 DIAGNOSIS — R4701 Aphasia: Secondary | ICD-10-CM | POA: Diagnosis not present

## 2023-10-22 ENCOUNTER — Non-Acute Institutional Stay: Payer: Self-pay | Admitting: Orthopedic Surgery

## 2023-10-22 DIAGNOSIS — G8929 Other chronic pain: Secondary | ICD-10-CM

## 2023-10-22 DIAGNOSIS — M546 Pain in thoracic spine: Secondary | ICD-10-CM

## 2023-10-22 DIAGNOSIS — R600 Localized edema: Secondary | ICD-10-CM | POA: Diagnosis not present

## 2023-10-22 DIAGNOSIS — G309 Alzheimer's disease, unspecified: Secondary | ICD-10-CM | POA: Diagnosis not present

## 2023-10-22 DIAGNOSIS — M150 Primary generalized (osteo)arthritis: Secondary | ICD-10-CM | POA: Diagnosis not present

## 2023-10-22 DIAGNOSIS — F0280 Dementia in other diseases classified elsewhere without behavioral disturbance: Secondary | ICD-10-CM

## 2023-10-22 DIAGNOSIS — R4701 Aphasia: Secondary | ICD-10-CM | POA: Diagnosis not present

## 2023-10-22 DIAGNOSIS — N1832 Chronic kidney disease, stage 3b: Secondary | ICD-10-CM

## 2023-10-22 DIAGNOSIS — F0150 Vascular dementia without behavioral disturbance: Secondary | ICD-10-CM | POA: Diagnosis not present

## 2023-10-22 DIAGNOSIS — J3089 Other allergic rhinitis: Secondary | ICD-10-CM

## 2023-10-22 DIAGNOSIS — R41841 Cognitive communication deficit: Secondary | ICD-10-CM | POA: Diagnosis not present

## 2023-10-22 DIAGNOSIS — R2681 Unsteadiness on feet: Secondary | ICD-10-CM | POA: Diagnosis not present

## 2023-10-22 DIAGNOSIS — M810 Age-related osteoporosis without current pathological fracture: Secondary | ICD-10-CM | POA: Diagnosis not present

## 2023-10-22 DIAGNOSIS — M6281 Muscle weakness (generalized): Secondary | ICD-10-CM | POA: Diagnosis not present

## 2023-10-22 DIAGNOSIS — M25551 Pain in right hip: Secondary | ICD-10-CM | POA: Diagnosis not present

## 2023-10-22 NOTE — Progress Notes (Signed)
 Location[facility] Room Number: 6/A Place of Service:  ALF (13) Provider:  Greig FORBES Cluster, NP   Charlanne Fredia CROME, MD  Patient Care Team: Charlanne Fredia CROME, MD as PCP - General (Internal Medicine) Pa, [facility] (Ophthalmology) Dermatology, [facility] (Dermatology)  Extended Emergency Contact Information Primary Emergency Contact: Jones,Beth  [place] Home Phone: [phone] Mobile Phone: [phone] Relation: Daughter  Code Status:  DNR Goals of care: Advanced Directive information    03/12/2023   10:33 AM  Advanced Directives  Does Patient Have a Medical Advance Directive? Yes  Type of Estate Agent of [place];Out of facility DNR (pink MOST or yellow form)  Does patient want to make changes to medical advance directive? No - Patient declined  Copy of Healthcare Power of Attorney in Chart? Yes - validated most recent copy scanned in chart (See row information)     Chief Complaint  Patient presents with   Medical Management of Chronic Issues    HPI:  Pt is a 89 y.o. female seen today for medical management of chronic diseases.    She continues to reside in assisted living at [facility]. PMH: HTN, HLD, venous insufficiency, GERD, dementia, right hip surgery, and diverticulitis.    Bilateral leg edema-  BUN/creat 34/1.44 08/09/2023, remains on torsemide  and KCL AD/Vascular dementia- MMSE 23/30 10/19/2023, was 24/30 (04/07/2023)> was 22/30 2022, MRI brain 2022 indicated mild age related cerebral atrophy with chronic small vessel disease, no behaviors, needs cueing in evening to complete ADLs, remains on Namenda   CKD- see above, GFR 35 08/09/2023 Osteoporosis- DEXA 1999, remains on calcium OA/ chronic back pain- back, knees and hips most bothersome, remains on scheduled tylenol  and voltaren gel Allergies- remains on Flonase Unstable gait- 01/17 enrolled with PT  Recent weights:  02/07- 146.8 lbs  01/02- 150  lbs  12/01- 145.4 lbs  Recent blood pressures:  02/05- 136/68  01/29- 127/70  01/22- 157/88   Past Medical History:  Diagnosis Date   Anxiety    Cataract    Chest pain 02/16/2010   Myoview, negative for pharmacologic-stress induced ischemia, EF 74   Complication of anesthesia 1954   hard to wake up   Diverticulosis    DUB (dysfunctional uterine bleeding)    Dysrhythmia    h/o PVCs- none recently- no meds   Fall    GERD (gastroesophageal reflux disease)    Hearing reduced    Hypertension    no meds currently   Loss of consciousness (HCC)    Memory loss    Osteoarthritis of left hip    Osteoporosis    Palpitation 02/02/2013   A CardioNet MCOT (mobile cardiac outpatient telemetry ): NSR with PACs; sinus tachycardia with ventricular trigeminy. Brief atrial run. Facet cardiac; ventricular trigeminy and accelerated idioventricular rhythm. No significant tachyarrhythmias.   Palpitations    Postmenopausal bleeding 11/01/2014   Unsteady gait    Varicose veins of both legs with pain    Past Surgical History:  Procedure Laterality Date   APPENDECTOMY     CATARACT EXTRACTION     both eyes   CESAREAN SECTION  1962, 1963, [id number],[id number]   x 4   COLONOSCOPY  01/14/2017   DIAGNOSTIC LAPAROSCOPY     ECTOPIC PREGNANCY SURGERY  1966   FRACTURE SURGERY     HIP SURGERY Right    HYSTEROSCOPY WITH D & C N/A 11/02/2014   Procedure: DILATATION AND CURETTAGE /HYSTEROSCOPY;  Surgeon: Ezzie Buba, MD;  Location: [facility];  Service: Gynecology;  Laterality: N/A;   Lower Extremity Venous Dopplers  01/2013   No thrombus or thrombophlebitis. R Greater Saphenous Vein - enlarged without other insufficiency (6 mm); right and left Small Saphenous Vein: Patent no valve insufficiency.   TONSILLECTOMY     TONSILLECTOMY AND ADENOIDECTOMY     TRANSTHORACIC ECHOCARDIOGRAM  01/2013   Normal LV size and function. EF 55-60%. Grade 1 diastolic dysfunction. Elevated LV filling pressures. No regional W.  May.    Allergies  Allergen Reactions   Evista [Raloxifene]    Fosamax [Alendronate]    Penicillins    Polyoxyethylene Lauryl Ether [Sorbitan]    Moxifloxacin Rash    Outpatient Encounter Medications as of 10/22/2023  Medication Sig   acetaminophen  (TYLENOL  8 HOUR ARTHRITIS PAIN) 650 MG CR tablet Take 650 mg by mouth 2 (two) times daily.   acetaminophen  (TYLENOL ) 500 MG tablet Take 1,000 mg by mouth 2 (two) times daily as needed.   Calcium Carbonate (CALCIUM 600 PO) Take 1 tablet by mouth daily.   calcium carbonate (TUMS - DOSED IN MG ELEMENTAL CALCIUM) 500 MG chewable tablet Chew 1 tablet by mouth 2 (two) times daily as needed for indigestion or heartburn.   diclofenac Sodium (VOLTAREN) 1 % GEL Apply 1 g topically 3 (three) times daily.   Emollient (CERAVE) LOTN Apply 1 application. topically daily.   fluticasone (FLONASE) 50 MCG/ACT nasal spray Place 2 sprays into both nostrils daily.   memantine  (NAMENDA ) 10 MG tablet Take 10 mg by mouth 2 (two) times daily.   MULTIPLE VITAMIN PO Take by mouth daily.   potassium chloride SA (KLOR-CON M) 20 MEQ tablet Take 30 mEq by mouth once.   torsemide  (DEMADEX ) 20 MG tablet Take 40 mg by mouth daily.   No facility-administered encounter medications on file as of 10/22/2023.    Review of Systems  Unable to perform ROS: Dementia    Immunization History  Administered Date(s) Administered   Fluad Quad(high Dose 65+) 07/08/2022   Influenza, Quadrivalent, Recombinant, Inj, Pf 06/18/2018, 05/04/2019, 06/15/2020   Influenza-Unspecified 09/15/2019, 07/08/2021   Moderna Sars-Covid-2 Vaccination 09/15/2019   PFIZER(Purple Top)SARS-COV-2 Vaccination 10/03/2019, 10/22/2019, 09/03/2020   Pfizer Covid-19 Vaccine Bivalent Booster 41yrs & up 06/04/2021   Pneumococcal Conjugate-13 05/28/2014   Pneumococcal Polysaccharide-23 07/15/1998, 06/15/2005   Tdap 05/26/2016   Unspecified SARS-COV-2 Vaccination 03/03/2022   Zoster, Live 05/18/2013, 05/28/2014    Pertinent  Health Maintenance Due  Topic Date Due   INFLUENZA VACCINE  04/15/2023   DEXA SCAN  11/05/2023 (Originally 03/06/1998)      10/23/2019    1:44 PM 05/14/2021    1:40 PM 05/22/2022    2:37 PM 07/23/2022    2:47 PM 05/28/2023   12:41 PM  Fall Risk  Falls in the past year? 0 1 0 0 1  Was there an injury with Fall?  0 0 0 1  Fall Risk Category Calculator  1 0 0 3  Fall Risk Category (Retired)  Low Low Low   (RETIRED) Patient Fall Risk Level  Moderate fall risk Low fall risk Low fall risk   Patient at Risk for Falls Due to  History of fall(s);Impaired balance/gait History of fall(s) No Fall Risks History of fall(s);Impaired balance/gait;Impaired mobility  Fall risk Follow up  Falls evaluation completed;Education provided;Falls prevention discussed Falls evaluation completed;Education provided;Falls prevention discussed Falls evaluation completed Falls evaluation completed;Education provided;Falls prevention discussed   Functional Status Survey:    Vitals:   10/22/23 1535  BP:  136/68  Pulse: 70  Resp: 16  Temp: 98.7 F (37.1 C)  SpO2: 96%  Weight: 146 lb 12.8 oz (66.6 kg)  Height: 5' 5 (1.651 m)   Body mass index is 24.43 kg/m. Physical Exam Vitals reviewed.  Constitutional:      General: She is not in acute distress. HENT:     Head: Normocephalic.     Right Ear: There is no impacted cerumen.     Left Ear: There is no impacted cerumen.     Nose: Nose normal.     Mouth/Throat:     Mouth: Mucous membranes are moist.  Eyes:     General:        Right eye: No discharge.        Left eye: No discharge.  Cardiovascular:     Rate and Rhythm: Normal rate and regular rhythm.     Pulses: Normal pulses.     Heart sounds: Normal heart sounds.  Pulmonary:     Effort: Pulmonary effort is normal.     Breath sounds: Normal breath sounds.  Abdominal:     General: Bowel sounds are normal.     Palpations: Abdomen is soft.  Musculoskeletal:     Cervical back: Neck supple.      Right lower leg: Edema present.     Left lower leg: Edema present.     Comments: Non pitting  Skin:    General: Skin is warm.     Capillary Refill: Capillary refill takes less than 2 seconds.  Neurological:     General: No focal deficit present.     Mental Status: She is alert. Mental status is at baseline.     Motor: Weakness present.     Gait: Gait abnormal.     Comments: walker  Psychiatric:        Mood and Affect: Mood normal.     Labs reviewed: Recent Labs    02/11/23 0000 02/18/23 0000 03/12/23 0000  NA 136* 136* 140  K 4.1 4.2 4.1  CL 106 103 107  CO2 24* 24* 25*  BUN 28* 27* 29*  CREATININE 0.9 1.0 1.2*  CALCIUM 9.4 9.7 8.9   Recent Labs    11/05/22 0000 02/11/23 0000  AST 13 15  ALT 14 13  ALKPHOS 98 106  ALBUMIN 3.3* 3.7   Recent Labs    11/05/22 0000 02/11/23 0000  WBC 5.0 5.9  NEUTROABS 2,615.00 3,404.00  HGB 11.2* 10.6*  HCT 33* 32*  PLT 243  --    Lab Results  Component Value Date   TSH 4.09 10/21/2021   No results found for: HGBA1C Lab Results  Component Value Date   CHOL 156 08/27/2020   HDL 69 08/27/2020   LDLCALC 75 08/27/2020   TRIG 62 08/27/2020   CHOLHDL 2.8 02/15/2010    Significant Diagnostic Results in last 30 days:  No results found.  Assessment/Plan 1. Bilateral leg edema (Primary) - ongoing - non pitting - cont torsemide    2. Mixed Alzheimer and vascular dementia (HCC) - no behaviors - MMSE 23/30 10/2023 - needing more cueing to accomplish tasks - gait appears to have worsened - weights stable - cont Namenda   3. Stage 3b chronic kidney disease (HCC) - encourage hydration with water - avoid NSAIDS - on torsemide  for edema  4. Age-related osteoporosis without current pathological fracture - DEXA 1999 - cont calcium supplement  5. Primary osteoarthritis involving multiple joints - knees, hips and back mos bothersome - cont  tylenol  and voltaren gel  6. Chronic midline thoracic back pain - see  above  7. Non-seasonal allergic rhinitis due to other allergic trigger - stable with Flonase  8. Unstable gait - see above - 01/17 enrolled with PT    Family/ staff Communication: plan discussed with patient and nurse  Labs/tests ordered:  none

## 2023-10-26 ENCOUNTER — Encounter: Payer: Self-pay | Admitting: Orthopedic Surgery

## 2023-10-26 DIAGNOSIS — M6281 Muscle weakness (generalized): Secondary | ICD-10-CM | POA: Diagnosis not present

## 2023-10-26 DIAGNOSIS — R41841 Cognitive communication deficit: Secondary | ICD-10-CM | POA: Diagnosis not present

## 2023-10-26 DIAGNOSIS — G309 Alzheimer's disease, unspecified: Secondary | ICD-10-CM | POA: Diagnosis not present

## 2023-10-26 DIAGNOSIS — M25551 Pain in right hip: Secondary | ICD-10-CM | POA: Diagnosis not present

## 2023-10-26 DIAGNOSIS — R2681 Unsteadiness on feet: Secondary | ICD-10-CM | POA: Diagnosis not present

## 2023-10-26 DIAGNOSIS — R4701 Aphasia: Secondary | ICD-10-CM | POA: Diagnosis not present

## 2023-10-27 DIAGNOSIS — G309 Alzheimer's disease, unspecified: Secondary | ICD-10-CM | POA: Diagnosis not present

## 2023-10-27 DIAGNOSIS — R2681 Unsteadiness on feet: Secondary | ICD-10-CM | POA: Diagnosis not present

## 2023-10-27 DIAGNOSIS — M25551 Pain in right hip: Secondary | ICD-10-CM | POA: Diagnosis not present

## 2023-10-27 DIAGNOSIS — R41841 Cognitive communication deficit: Secondary | ICD-10-CM | POA: Diagnosis not present

## 2023-10-27 DIAGNOSIS — R4701 Aphasia: Secondary | ICD-10-CM | POA: Diagnosis not present

## 2023-10-27 DIAGNOSIS — M6281 Muscle weakness (generalized): Secondary | ICD-10-CM | POA: Diagnosis not present

## 2023-10-28 DIAGNOSIS — R41841 Cognitive communication deficit: Secondary | ICD-10-CM | POA: Diagnosis not present

## 2023-10-28 DIAGNOSIS — R2681 Unsteadiness on feet: Secondary | ICD-10-CM | POA: Diagnosis not present

## 2023-10-28 DIAGNOSIS — G309 Alzheimer's disease, unspecified: Secondary | ICD-10-CM | POA: Diagnosis not present

## 2023-10-28 DIAGNOSIS — R4701 Aphasia: Secondary | ICD-10-CM | POA: Diagnosis not present

## 2023-10-28 DIAGNOSIS — M25551 Pain in right hip: Secondary | ICD-10-CM | POA: Diagnosis not present

## 2023-10-28 DIAGNOSIS — M6281 Muscle weakness (generalized): Secondary | ICD-10-CM | POA: Diagnosis not present

## 2023-10-29 DIAGNOSIS — M6281 Muscle weakness (generalized): Secondary | ICD-10-CM | POA: Diagnosis not present

## 2023-10-29 DIAGNOSIS — R4701 Aphasia: Secondary | ICD-10-CM | POA: Diagnosis not present

## 2023-10-29 DIAGNOSIS — M25551 Pain in right hip: Secondary | ICD-10-CM | POA: Diagnosis not present

## 2023-10-29 DIAGNOSIS — R2681 Unsteadiness on feet: Secondary | ICD-10-CM | POA: Diagnosis not present

## 2023-10-29 DIAGNOSIS — R41841 Cognitive communication deficit: Secondary | ICD-10-CM | POA: Diagnosis not present

## 2023-10-29 DIAGNOSIS — G309 Alzheimer's disease, unspecified: Secondary | ICD-10-CM | POA: Diagnosis not present

## 2023-11-02 DIAGNOSIS — M25551 Pain in right hip: Secondary | ICD-10-CM | POA: Diagnosis not present

## 2023-11-02 DIAGNOSIS — R41841 Cognitive communication deficit: Secondary | ICD-10-CM | POA: Diagnosis not present

## 2023-11-02 DIAGNOSIS — R2681 Unsteadiness on feet: Secondary | ICD-10-CM | POA: Diagnosis not present

## 2023-11-02 DIAGNOSIS — R4701 Aphasia: Secondary | ICD-10-CM | POA: Diagnosis not present

## 2023-11-02 DIAGNOSIS — M6281 Muscle weakness (generalized): Secondary | ICD-10-CM | POA: Diagnosis not present

## 2023-11-02 DIAGNOSIS — G309 Alzheimer's disease, unspecified: Secondary | ICD-10-CM | POA: Diagnosis not present

## 2023-11-03 DIAGNOSIS — M25551 Pain in right hip: Secondary | ICD-10-CM | POA: Diagnosis not present

## 2023-11-03 DIAGNOSIS — M6281 Muscle weakness (generalized): Secondary | ICD-10-CM | POA: Diagnosis not present

## 2023-11-03 DIAGNOSIS — G309 Alzheimer's disease, unspecified: Secondary | ICD-10-CM | POA: Diagnosis not present

## 2023-11-03 DIAGNOSIS — R41841 Cognitive communication deficit: Secondary | ICD-10-CM | POA: Diagnosis not present

## 2023-11-03 DIAGNOSIS — R4701 Aphasia: Secondary | ICD-10-CM | POA: Diagnosis not present

## 2023-11-03 DIAGNOSIS — R2681 Unsteadiness on feet: Secondary | ICD-10-CM | POA: Diagnosis not present

## 2023-11-04 DIAGNOSIS — R41841 Cognitive communication deficit: Secondary | ICD-10-CM | POA: Diagnosis not present

## 2023-11-04 DIAGNOSIS — M25551 Pain in right hip: Secondary | ICD-10-CM | POA: Diagnosis not present

## 2023-11-04 DIAGNOSIS — G309 Alzheimer's disease, unspecified: Secondary | ICD-10-CM | POA: Diagnosis not present

## 2023-11-04 DIAGNOSIS — R2681 Unsteadiness on feet: Secondary | ICD-10-CM | POA: Diagnosis not present

## 2023-11-04 DIAGNOSIS — M6281 Muscle weakness (generalized): Secondary | ICD-10-CM | POA: Diagnosis not present

## 2023-11-04 DIAGNOSIS — R4701 Aphasia: Secondary | ICD-10-CM | POA: Diagnosis not present

## 2023-11-05 DIAGNOSIS — M6281 Muscle weakness (generalized): Secondary | ICD-10-CM | POA: Diagnosis not present

## 2023-11-05 DIAGNOSIS — M25551 Pain in right hip: Secondary | ICD-10-CM | POA: Diagnosis not present

## 2023-11-05 DIAGNOSIS — R4701 Aphasia: Secondary | ICD-10-CM | POA: Diagnosis not present

## 2023-11-05 DIAGNOSIS — G309 Alzheimer's disease, unspecified: Secondary | ICD-10-CM | POA: Diagnosis not present

## 2023-11-05 DIAGNOSIS — R2681 Unsteadiness on feet: Secondary | ICD-10-CM | POA: Diagnosis not present

## 2023-11-05 DIAGNOSIS — R41841 Cognitive communication deficit: Secondary | ICD-10-CM | POA: Diagnosis not present

## 2023-11-08 DIAGNOSIS — G309 Alzheimer's disease, unspecified: Secondary | ICD-10-CM | POA: Diagnosis not present

## 2023-11-08 DIAGNOSIS — M6281 Muscle weakness (generalized): Secondary | ICD-10-CM | POA: Diagnosis not present

## 2023-11-08 DIAGNOSIS — M25551 Pain in right hip: Secondary | ICD-10-CM | POA: Diagnosis not present

## 2023-11-08 DIAGNOSIS — R41841 Cognitive communication deficit: Secondary | ICD-10-CM | POA: Diagnosis not present

## 2023-11-08 DIAGNOSIS — R4701 Aphasia: Secondary | ICD-10-CM | POA: Diagnosis not present

## 2023-11-08 DIAGNOSIS — R2681 Unsteadiness on feet: Secondary | ICD-10-CM | POA: Diagnosis not present

## 2023-11-09 DIAGNOSIS — M6281 Muscle weakness (generalized): Secondary | ICD-10-CM | POA: Diagnosis not present

## 2023-11-09 DIAGNOSIS — G309 Alzheimer's disease, unspecified: Secondary | ICD-10-CM | POA: Diagnosis not present

## 2023-11-09 DIAGNOSIS — R4701 Aphasia: Secondary | ICD-10-CM | POA: Diagnosis not present

## 2023-11-09 DIAGNOSIS — R2681 Unsteadiness on feet: Secondary | ICD-10-CM | POA: Diagnosis not present

## 2023-11-09 DIAGNOSIS — R41841 Cognitive communication deficit: Secondary | ICD-10-CM | POA: Diagnosis not present

## 2023-11-09 DIAGNOSIS — M25551 Pain in right hip: Secondary | ICD-10-CM | POA: Diagnosis not present

## 2023-11-10 DIAGNOSIS — R41841 Cognitive communication deficit: Secondary | ICD-10-CM | POA: Diagnosis not present

## 2023-11-10 DIAGNOSIS — M25551 Pain in right hip: Secondary | ICD-10-CM | POA: Diagnosis not present

## 2023-11-10 DIAGNOSIS — M6281 Muscle weakness (generalized): Secondary | ICD-10-CM | POA: Diagnosis not present

## 2023-11-10 DIAGNOSIS — R4701 Aphasia: Secondary | ICD-10-CM | POA: Diagnosis not present

## 2023-11-10 DIAGNOSIS — G309 Alzheimer's disease, unspecified: Secondary | ICD-10-CM | POA: Diagnosis not present

## 2023-11-10 DIAGNOSIS — R2681 Unsteadiness on feet: Secondary | ICD-10-CM | POA: Diagnosis not present

## 2023-11-11 DIAGNOSIS — M25551 Pain in right hip: Secondary | ICD-10-CM | POA: Diagnosis not present

## 2023-11-11 DIAGNOSIS — M6281 Muscle weakness (generalized): Secondary | ICD-10-CM | POA: Diagnosis not present

## 2023-11-11 DIAGNOSIS — R2681 Unsteadiness on feet: Secondary | ICD-10-CM | POA: Diagnosis not present

## 2023-11-11 DIAGNOSIS — R41841 Cognitive communication deficit: Secondary | ICD-10-CM | POA: Diagnosis not present

## 2023-11-11 DIAGNOSIS — R4701 Aphasia: Secondary | ICD-10-CM | POA: Diagnosis not present

## 2023-11-11 DIAGNOSIS — G309 Alzheimer's disease, unspecified: Secondary | ICD-10-CM | POA: Diagnosis not present

## 2023-11-12 DIAGNOSIS — M6281 Muscle weakness (generalized): Secondary | ICD-10-CM | POA: Diagnosis not present

## 2023-11-12 DIAGNOSIS — R41841 Cognitive communication deficit: Secondary | ICD-10-CM | POA: Diagnosis not present

## 2023-11-12 DIAGNOSIS — G309 Alzheimer's disease, unspecified: Secondary | ICD-10-CM | POA: Diagnosis not present

## 2023-11-12 DIAGNOSIS — R2681 Unsteadiness on feet: Secondary | ICD-10-CM | POA: Diagnosis not present

## 2023-11-12 DIAGNOSIS — R4701 Aphasia: Secondary | ICD-10-CM | POA: Diagnosis not present

## 2023-11-12 DIAGNOSIS — M25551 Pain in right hip: Secondary | ICD-10-CM | POA: Diagnosis not present

## 2023-11-15 DIAGNOSIS — G309 Alzheimer's disease, unspecified: Secondary | ICD-10-CM | POA: Diagnosis not present

## 2023-11-15 DIAGNOSIS — M6281 Muscle weakness (generalized): Secondary | ICD-10-CM | POA: Diagnosis not present

## 2023-11-15 DIAGNOSIS — M25551 Pain in right hip: Secondary | ICD-10-CM | POA: Diagnosis not present

## 2023-11-15 DIAGNOSIS — R2681 Unsteadiness on feet: Secondary | ICD-10-CM | POA: Diagnosis not present

## 2023-11-15 DIAGNOSIS — R41841 Cognitive communication deficit: Secondary | ICD-10-CM | POA: Diagnosis not present

## 2023-11-15 DIAGNOSIS — H9190 Unspecified hearing loss, unspecified ear: Secondary | ICD-10-CM | POA: Diagnosis not present

## 2023-11-16 DIAGNOSIS — R2681 Unsteadiness on feet: Secondary | ICD-10-CM | POA: Diagnosis not present

## 2023-11-16 DIAGNOSIS — H9190 Unspecified hearing loss, unspecified ear: Secondary | ICD-10-CM | POA: Diagnosis not present

## 2023-11-16 DIAGNOSIS — M25551 Pain in right hip: Secondary | ICD-10-CM | POA: Diagnosis not present

## 2023-11-16 DIAGNOSIS — R41841 Cognitive communication deficit: Secondary | ICD-10-CM | POA: Diagnosis not present

## 2023-11-16 DIAGNOSIS — M6281 Muscle weakness (generalized): Secondary | ICD-10-CM | POA: Diagnosis not present

## 2023-11-16 DIAGNOSIS — G309 Alzheimer's disease, unspecified: Secondary | ICD-10-CM | POA: Diagnosis not present

## 2023-11-18 DIAGNOSIS — M6281 Muscle weakness (generalized): Secondary | ICD-10-CM | POA: Diagnosis not present

## 2023-11-18 DIAGNOSIS — R2681 Unsteadiness on feet: Secondary | ICD-10-CM | POA: Diagnosis not present

## 2023-11-18 DIAGNOSIS — H9190 Unspecified hearing loss, unspecified ear: Secondary | ICD-10-CM | POA: Diagnosis not present

## 2023-11-18 DIAGNOSIS — R41841 Cognitive communication deficit: Secondary | ICD-10-CM | POA: Diagnosis not present

## 2023-11-18 DIAGNOSIS — M25551 Pain in right hip: Secondary | ICD-10-CM | POA: Diagnosis not present

## 2023-11-18 DIAGNOSIS — G309 Alzheimer's disease, unspecified: Secondary | ICD-10-CM | POA: Diagnosis not present

## 2023-11-23 DIAGNOSIS — H9190 Unspecified hearing loss, unspecified ear: Secondary | ICD-10-CM | POA: Diagnosis not present

## 2023-11-23 DIAGNOSIS — M6281 Muscle weakness (generalized): Secondary | ICD-10-CM | POA: Diagnosis not present

## 2023-11-23 DIAGNOSIS — M25551 Pain in right hip: Secondary | ICD-10-CM | POA: Diagnosis not present

## 2023-11-23 DIAGNOSIS — R41841 Cognitive communication deficit: Secondary | ICD-10-CM | POA: Diagnosis not present

## 2023-11-23 DIAGNOSIS — R2681 Unsteadiness on feet: Secondary | ICD-10-CM | POA: Diagnosis not present

## 2023-11-23 DIAGNOSIS — G309 Alzheimer's disease, unspecified: Secondary | ICD-10-CM | POA: Diagnosis not present

## 2023-11-26 DIAGNOSIS — M6281 Muscle weakness (generalized): Secondary | ICD-10-CM | POA: Diagnosis not present

## 2023-11-26 DIAGNOSIS — R2681 Unsteadiness on feet: Secondary | ICD-10-CM | POA: Diagnosis not present

## 2023-11-26 DIAGNOSIS — H9190 Unspecified hearing loss, unspecified ear: Secondary | ICD-10-CM | POA: Diagnosis not present

## 2023-11-26 DIAGNOSIS — R41841 Cognitive communication deficit: Secondary | ICD-10-CM | POA: Diagnosis not present

## 2023-11-26 DIAGNOSIS — M25551 Pain in right hip: Secondary | ICD-10-CM | POA: Diagnosis not present

## 2023-11-26 DIAGNOSIS — G309 Alzheimer's disease, unspecified: Secondary | ICD-10-CM | POA: Diagnosis not present

## 2023-11-29 DIAGNOSIS — H9190 Unspecified hearing loss, unspecified ear: Secondary | ICD-10-CM | POA: Diagnosis not present

## 2023-11-29 DIAGNOSIS — M25551 Pain in right hip: Secondary | ICD-10-CM | POA: Diagnosis not present

## 2023-11-29 DIAGNOSIS — M6281 Muscle weakness (generalized): Secondary | ICD-10-CM | POA: Diagnosis not present

## 2023-11-29 DIAGNOSIS — R2681 Unsteadiness on feet: Secondary | ICD-10-CM | POA: Diagnosis not present

## 2023-11-29 DIAGNOSIS — G309 Alzheimer's disease, unspecified: Secondary | ICD-10-CM | POA: Diagnosis not present

## 2023-11-29 DIAGNOSIS — R41841 Cognitive communication deficit: Secondary | ICD-10-CM | POA: Diagnosis not present

## 2023-11-30 DIAGNOSIS — G309 Alzheimer's disease, unspecified: Secondary | ICD-10-CM | POA: Diagnosis not present

## 2023-11-30 DIAGNOSIS — R41841 Cognitive communication deficit: Secondary | ICD-10-CM | POA: Diagnosis not present

## 2023-11-30 DIAGNOSIS — H9190 Unspecified hearing loss, unspecified ear: Secondary | ICD-10-CM | POA: Diagnosis not present

## 2023-11-30 DIAGNOSIS — M25551 Pain in right hip: Secondary | ICD-10-CM | POA: Diagnosis not present

## 2023-11-30 DIAGNOSIS — R2681 Unsteadiness on feet: Secondary | ICD-10-CM | POA: Diagnosis not present

## 2023-11-30 DIAGNOSIS — M6281 Muscle weakness (generalized): Secondary | ICD-10-CM | POA: Diagnosis not present

## 2023-12-02 DIAGNOSIS — H9190 Unspecified hearing loss, unspecified ear: Secondary | ICD-10-CM | POA: Diagnosis not present

## 2023-12-02 DIAGNOSIS — R2681 Unsteadiness on feet: Secondary | ICD-10-CM | POA: Diagnosis not present

## 2023-12-02 DIAGNOSIS — M25551 Pain in right hip: Secondary | ICD-10-CM | POA: Diagnosis not present

## 2023-12-02 DIAGNOSIS — M6281 Muscle weakness (generalized): Secondary | ICD-10-CM | POA: Diagnosis not present

## 2023-12-02 DIAGNOSIS — G309 Alzheimer's disease, unspecified: Secondary | ICD-10-CM | POA: Diagnosis not present

## 2023-12-02 DIAGNOSIS — R41841 Cognitive communication deficit: Secondary | ICD-10-CM | POA: Diagnosis not present

## 2023-12-03 DIAGNOSIS — M6281 Muscle weakness (generalized): Secondary | ICD-10-CM | POA: Diagnosis not present

## 2023-12-03 DIAGNOSIS — M25551 Pain in right hip: Secondary | ICD-10-CM | POA: Diagnosis not present

## 2023-12-03 DIAGNOSIS — R41841 Cognitive communication deficit: Secondary | ICD-10-CM | POA: Diagnosis not present

## 2023-12-03 DIAGNOSIS — G309 Alzheimer's disease, unspecified: Secondary | ICD-10-CM | POA: Diagnosis not present

## 2023-12-03 DIAGNOSIS — R2681 Unsteadiness on feet: Secondary | ICD-10-CM | POA: Diagnosis not present

## 2023-12-03 DIAGNOSIS — H9190 Unspecified hearing loss, unspecified ear: Secondary | ICD-10-CM | POA: Diagnosis not present

## 2023-12-06 DIAGNOSIS — H9190 Unspecified hearing loss, unspecified ear: Secondary | ICD-10-CM | POA: Diagnosis not present

## 2023-12-06 DIAGNOSIS — G309 Alzheimer's disease, unspecified: Secondary | ICD-10-CM | POA: Diagnosis not present

## 2023-12-06 DIAGNOSIS — R2681 Unsteadiness on feet: Secondary | ICD-10-CM | POA: Diagnosis not present

## 2023-12-06 DIAGNOSIS — R41841 Cognitive communication deficit: Secondary | ICD-10-CM | POA: Diagnosis not present

## 2023-12-06 DIAGNOSIS — M25551 Pain in right hip: Secondary | ICD-10-CM | POA: Diagnosis not present

## 2023-12-06 DIAGNOSIS — M6281 Muscle weakness (generalized): Secondary | ICD-10-CM | POA: Diagnosis not present

## 2023-12-07 DIAGNOSIS — H9190 Unspecified hearing loss, unspecified ear: Secondary | ICD-10-CM | POA: Diagnosis not present

## 2023-12-07 DIAGNOSIS — R2681 Unsteadiness on feet: Secondary | ICD-10-CM | POA: Diagnosis not present

## 2023-12-07 DIAGNOSIS — M6281 Muscle weakness (generalized): Secondary | ICD-10-CM | POA: Diagnosis not present

## 2023-12-07 DIAGNOSIS — R41841 Cognitive communication deficit: Secondary | ICD-10-CM | POA: Diagnosis not present

## 2023-12-07 DIAGNOSIS — M25551 Pain in right hip: Secondary | ICD-10-CM | POA: Diagnosis not present

## 2023-12-07 DIAGNOSIS — G309 Alzheimer's disease, unspecified: Secondary | ICD-10-CM | POA: Diagnosis not present

## 2023-12-09 DIAGNOSIS — M25551 Pain in right hip: Secondary | ICD-10-CM | POA: Diagnosis not present

## 2023-12-09 DIAGNOSIS — R2681 Unsteadiness on feet: Secondary | ICD-10-CM | POA: Diagnosis not present

## 2023-12-09 DIAGNOSIS — R41841 Cognitive communication deficit: Secondary | ICD-10-CM | POA: Diagnosis not present

## 2023-12-09 DIAGNOSIS — H9190 Unspecified hearing loss, unspecified ear: Secondary | ICD-10-CM | POA: Diagnosis not present

## 2023-12-09 DIAGNOSIS — M6281 Muscle weakness (generalized): Secondary | ICD-10-CM | POA: Diagnosis not present

## 2023-12-09 DIAGNOSIS — G309 Alzheimer's disease, unspecified: Secondary | ICD-10-CM | POA: Diagnosis not present

## 2023-12-14 DIAGNOSIS — H9190 Unspecified hearing loss, unspecified ear: Secondary | ICD-10-CM | POA: Diagnosis not present

## 2023-12-14 DIAGNOSIS — G309 Alzheimer's disease, unspecified: Secondary | ICD-10-CM | POA: Diagnosis not present

## 2023-12-14 DIAGNOSIS — R2681 Unsteadiness on feet: Secondary | ICD-10-CM | POA: Diagnosis not present

## 2023-12-14 DIAGNOSIS — M25551 Pain in right hip: Secondary | ICD-10-CM | POA: Diagnosis not present

## 2023-12-14 DIAGNOSIS — R41841 Cognitive communication deficit: Secondary | ICD-10-CM | POA: Diagnosis not present

## 2023-12-14 DIAGNOSIS — M6281 Muscle weakness (generalized): Secondary | ICD-10-CM | POA: Diagnosis not present

## 2023-12-15 DIAGNOSIS — R41841 Cognitive communication deficit: Secondary | ICD-10-CM | POA: Diagnosis not present

## 2023-12-15 DIAGNOSIS — M25551 Pain in right hip: Secondary | ICD-10-CM | POA: Diagnosis not present

## 2023-12-15 DIAGNOSIS — R2681 Unsteadiness on feet: Secondary | ICD-10-CM | POA: Diagnosis not present

## 2023-12-15 DIAGNOSIS — M6281 Muscle weakness (generalized): Secondary | ICD-10-CM | POA: Diagnosis not present

## 2023-12-15 DIAGNOSIS — H9190 Unspecified hearing loss, unspecified ear: Secondary | ICD-10-CM | POA: Diagnosis not present

## 2023-12-15 DIAGNOSIS — G309 Alzheimer's disease, unspecified: Secondary | ICD-10-CM | POA: Diagnosis not present

## 2023-12-16 ENCOUNTER — Non-Acute Institutional Stay: Payer: Self-pay | Admitting: Internal Medicine

## 2023-12-16 DIAGNOSIS — F0150 Vascular dementia without behavioral disturbance: Secondary | ICD-10-CM

## 2023-12-16 DIAGNOSIS — F0280 Dementia in other diseases classified elsewhere without behavioral disturbance: Secondary | ICD-10-CM

## 2023-12-16 DIAGNOSIS — H9190 Unspecified hearing loss, unspecified ear: Secondary | ICD-10-CM | POA: Diagnosis not present

## 2023-12-16 DIAGNOSIS — R635 Abnormal weight gain: Secondary | ICD-10-CM

## 2023-12-16 DIAGNOSIS — G309 Alzheimer's disease, unspecified: Secondary | ICD-10-CM

## 2023-12-16 DIAGNOSIS — R41841 Cognitive communication deficit: Secondary | ICD-10-CM | POA: Diagnosis not present

## 2023-12-16 DIAGNOSIS — R600 Localized edema: Secondary | ICD-10-CM | POA: Diagnosis not present

## 2023-12-16 DIAGNOSIS — N1832 Chronic kidney disease, stage 3b: Secondary | ICD-10-CM

## 2023-12-16 DIAGNOSIS — M6281 Muscle weakness (generalized): Secondary | ICD-10-CM | POA: Diagnosis not present

## 2023-12-16 DIAGNOSIS — M25551 Pain in right hip: Secondary | ICD-10-CM | POA: Diagnosis not present

## 2023-12-16 DIAGNOSIS — R2681 Unsteadiness on feet: Secondary | ICD-10-CM | POA: Diagnosis not present

## 2023-12-17 DIAGNOSIS — H9190 Unspecified hearing loss, unspecified ear: Secondary | ICD-10-CM | POA: Diagnosis not present

## 2023-12-17 DIAGNOSIS — M25551 Pain in right hip: Secondary | ICD-10-CM | POA: Diagnosis not present

## 2023-12-17 DIAGNOSIS — M6281 Muscle weakness (generalized): Secondary | ICD-10-CM | POA: Diagnosis not present

## 2023-12-17 DIAGNOSIS — I10 Essential (primary) hypertension: Secondary | ICD-10-CM | POA: Diagnosis not present

## 2023-12-17 DIAGNOSIS — G309 Alzheimer's disease, unspecified: Secondary | ICD-10-CM | POA: Diagnosis not present

## 2023-12-17 DIAGNOSIS — R2681 Unsteadiness on feet: Secondary | ICD-10-CM | POA: Diagnosis not present

## 2023-12-17 DIAGNOSIS — R0602 Shortness of breath: Secondary | ICD-10-CM | POA: Diagnosis not present

## 2023-12-17 DIAGNOSIS — R41841 Cognitive communication deficit: Secondary | ICD-10-CM | POA: Diagnosis not present

## 2023-12-17 LAB — CBC AND DIFFERENTIAL
HCT: 30 — AB (ref 36–46)
Hemoglobin: 9.9 — AB (ref 12.0–16.0)
Neutrophils Absolute: 2315
Platelets: 249 10*3/uL (ref 150–400)
WBC: 5

## 2023-12-17 LAB — BASIC METABOLIC PANEL WITH GFR
BUN: 37 — AB (ref 4–21)
CO2: 25 — AB (ref 13–22)
Chloride: 108 (ref 99–108)
Creatinine: 1.3 — AB (ref 0.5–1.1)
Glucose: 76
Potassium: 4.3 meq/L (ref 3.5–5.1)
Sodium: 140 (ref 137–147)

## 2023-12-17 LAB — COMPREHENSIVE METABOLIC PANEL WITH GFR
Albumin: 3.2 — AB (ref 3.5–5.0)
Calcium: 8.9 (ref 8.7–10.7)
Globulin: 2.3
eGFR: 38

## 2023-12-17 LAB — HEPATIC FUNCTION PANEL
ALT: 14 U/L (ref 7–35)
AST: 17 (ref 13–35)
Alkaline Phosphatase: 117 (ref 25–125)
Bilirubin, Total: 0.3

## 2023-12-17 LAB — CBC: RBC: 3.3 — AB (ref 3.87–5.11)

## 2023-12-20 ENCOUNTER — Encounter: Payer: Self-pay | Admitting: Internal Medicine

## 2023-12-20 NOTE — Progress Notes (Signed)
 [facility] Service:  ALF (13)  Provider:   Code Status: DNR Goals of Care:     03/12/2023   10:33 AM  Advanced Directives  Does Patient Have a Medical Advance Directive? Yes  Type of Estate agent of [place];Out of facility DNR (pink MOST or yellow form)  Does patient want to make changes to medical advance directive? No - Patient declined  Copy of Healthcare Power of Attorney in Chart? Yes - validated most recent copy scanned in chart (See row information)     Chief Complaint  Patient presents with   Acute Visit    HPI: Patient is a 89 y.o. female seen today for an acute visit for Bilateral Edema and weight Gain Lives in [place] in [facility]   Has h/o Dementia, HLD,Hypertension,  Diverticulitis and GERD  Nurses noticed more edema in her Legs And she gained more weight  No Cough or SOB or PND No redness and Pain Also was having some weakness but had no Complains   Wt Readings from Last 3 Encounters:  12/16/23 153 lb 6.4 oz (69.6 kg)  10/22/23 146 lb 12.8 oz (66.6 kg)  08/05/23 148 lb (67.1 kg)       Past Medical History:  Diagnosis Date   Anxiety    Cataract    Chest pain 02/16/2010   Myoview, negative for pharmacologic-stress induced ischemia, EF 74   Complication of anesthesia 1954   hard to wake up   Diverticulosis    DUB (dysfunctional uterine bleeding)    Dysrhythmia    h/o PVCs- none recently- no meds   Fall    GERD (gastroesophageal reflux disease)    Hearing reduced    Hypertension    no meds currently   Loss of consciousness (HCC)    Memory loss    Osteoarthritis of left hip    Osteoporosis    Palpitation 02/02/2013   A CardioNet MCOT (mobile cardiac outpatient telemetry ): NSR with PACs; sinus tachycardia with ventricular trigeminy. Brief atrial run. Facet cardiac; ventricular trigeminy and accelerated idioventricular rhythm. No significant tachyarrhythmias.   Palpitations    Postmenopausal  bleeding 11/01/2014   Unsteady gait    Varicose veins of both legs with pain     Past Surgical History:  Procedure Laterality Date   APPENDECTOMY     CATARACT EXTRACTION     both eyes   CESAREAN SECTION  1962, 1963, [id number]   x 4   COLONOSCOPY  01/14/2017   DIAGNOSTIC LAPAROSCOPY     ECTOPIC PREGNANCY SURGERY  1966   FRACTURE SURGERY     HIP SURGERY Right    HYSTEROSCOPY WITH D & C N/A 11/02/2014   Procedure: DILATATION AND CURETTAGE /HYSTEROSCOPY;  Surgeon: Sherian Rein, MD;  Location: [facility];  Service: Gynecology;  Laterality: N/A;   Lower Extremity Venous Dopplers  01/2013   No thrombus or thrombophlebitis. R Greater Saphenous Vein - enlarged without other insufficiency (6 mm); right and left Small Saphenous Vein: Patent no valve insufficiency.   TONSILLECTOMY     TONSILLECTOMY AND ADENOIDECTOMY     TRANSTHORACIC ECHOCARDIOGRAM  01/2013   Normal LV size and function. EF 55-60%. Grade 1 diastolic dysfunction. Elevated LV filling pressures. No regional W. May.    Allergies  Allergen Reactions   Evista [Raloxifene]    Fosamax [Alendronate]    Penicillins    Polyoxyethylene Lauryl Ether [Sorbitan]    Moxifloxacin Rash    Outpatient Encounter  Medications as of 12/16/2023  Medication Sig   acetaminophen (TYLENOL 8 HOUR ARTHRITIS PAIN) 650 MG CR tablet Take 650 mg by mouth 2 (two) times daily.   acetaminophen (TYLENOL) 500 MG tablet Take 1,000 mg by mouth 2 (two) times daily as needed.   Calcium Carbonate (CALCIUM 600 PO) Take 1 tablet by mouth daily.   calcium carbonate (TUMS - DOSED IN MG ELEMENTAL CALCIUM) 500 MG chewable tablet Chew 1 tablet by mouth 2 (two) times daily as needed for indigestion or heartburn.   diclofenac Sodium (VOLTAREN) 1 % GEL Apply 1 g topically 3 (three) times daily.   Emollient (CERAVE) LOTN Apply 1 application. topically daily.   fluticasone (FLONASE) 50 MCG/ACT nasal spray Place 2 sprays into both nostrils daily.   memantine (NAMENDA) 10  MG tablet Take 10 mg by mouth 2 (two) times daily.   MULTIPLE VITAMIN PO Take by mouth daily.   potassium chloride SA (KLOR-CON M) 20 MEQ tablet Take 30 mEq by mouth once.   torsemide (DEMADEX) 20 MG tablet Take 40 mg by mouth daily.   No facility-administered encounter medications on file as of 12/16/2023.    Review of Systems:  Review of Systems  Unable to perform ROS: Dementia    Health Maintenance  Topic Date Due   Zoster Vaccines- Shingrix (1 of 2) 03/07/1983   DEXA SCAN  Never done   COVID-19 Vaccine (8 - 2024-25 season) 09/15/2023   INFLUENZA VACCINE  04/14/2024   Medicare Annual Wellness (AWV)  05/27/2024   DTaP/Tdap/Td (2 - Td or Tdap) 05/26/2026   Pneumonia Vaccine 45+ Years old  Completed   HPV VACCINES  Aged Out    Physical Exam: Vitals:   12/16/23 1158  BP: 138/81  Pulse: 66  Resp: 20  Temp: 97.8 F (36.6 C)  Weight: 153 lb 6.4 oz (69.6 kg)   Body mass index is 25.53 kg/m. Physical Exam Vitals reviewed.  Constitutional:      Appearance: Normal appearance.  HENT:     Head: Normocephalic.     Nose: Nose normal.     Mouth/Throat:     Mouth: Mucous membranes are moist.     Pharynx: Oropharynx is clear.  Eyes:     Pupils: Pupils are equal, round, and reactive to light.  Cardiovascular:     Rate and Rhythm: Normal rate and regular rhythm.     Pulses: Normal pulses.     Heart sounds: Normal heart sounds. No murmur heard. Pulmonary:     Effort: Pulmonary effort is normal.     Breath sounds: Normal breath sounds.  Abdominal:     General: Abdomen is flat. Bowel sounds are normal.     Palpations: Abdomen is soft.  Musculoskeletal:        General: Swelling present.     Cervical back: Neck supple.     Comments: Worsening Edema Bilateral  Skin:    General: Skin is warm.  Neurological:     General: No focal deficit present.     Mental Status: She is alert.  Psychiatric:        Mood and Affect: Mood normal.        Thought Content: Thought content  normal.     Labs reviewed: Basic Metabolic Panel: Recent Labs    02/11/23 0000 02/18/23 0000 03/12/23 0000  NA 136* 136* 140  K 4.1 4.2 4.1  CL 106 103 107  CO2 24* 24* 25*  BUN 28* 27* 29*  CREATININE 0.9 1.0 1.2*  CALCIUM 9.4 9.7 8.9   Liver Function Tests: Recent Labs    02/11/23 0000  AST 15  ALT 13  ALKPHOS 106  ALBUMIN 3.7   No results for input(s): "LIPASE", "AMYLASE" in the last 8760 hours. No results for input(s): "AMMONIA" in the last 8760 hours. CBC: Recent Labs    02/11/23 0000  WBC 5.9  NEUTROABS 3,404.00  HGB 10.6*  HCT 32*   Lipid Panel: No results for input(s): "CHOL", "HDL", "LDLCALC", "TRIG", "CHOLHDL", "LDLDIRECT" in the last 8760 hours. No results found for: "HGBA1C"  Procedures since last visit: No results found.  Assessment/Plan 1. Bilateral leg edema (Primary) Stat BNP,BMP and CBC ordered BUN 27 Creat 1.20 BNP 137 Will Give her Torsemide 20 mg with 40 mg for 3 days Will Continue  to monitor her weight   2. Weight gain AS above  3. Mixed Alzheimer and vascular dementia (HCC) Doing well in AL  On Namenda 4 Anemia Hgb 9.9 Most likely due to her CKD Will order Iron studies and B 12 with Next blood draw  4. Stage 3b chronic kidney disease (HCC) Creat stable    Labs/tests ordered:  CBC,Iron Studies and B 12 in 4 weeks Next appt:  Visit date not found

## 2023-12-21 DIAGNOSIS — M6281 Muscle weakness (generalized): Secondary | ICD-10-CM | POA: Diagnosis not present

## 2023-12-21 DIAGNOSIS — R2681 Unsteadiness on feet: Secondary | ICD-10-CM | POA: Diagnosis not present

## 2023-12-21 DIAGNOSIS — M25551 Pain in right hip: Secondary | ICD-10-CM | POA: Diagnosis not present

## 2023-12-21 DIAGNOSIS — H9190 Unspecified hearing loss, unspecified ear: Secondary | ICD-10-CM | POA: Diagnosis not present

## 2023-12-21 DIAGNOSIS — G309 Alzheimer's disease, unspecified: Secondary | ICD-10-CM | POA: Diagnosis not present

## 2023-12-21 DIAGNOSIS — R41841 Cognitive communication deficit: Secondary | ICD-10-CM | POA: Diagnosis not present

## 2023-12-23 DIAGNOSIS — H9190 Unspecified hearing loss, unspecified ear: Secondary | ICD-10-CM | POA: Diagnosis not present

## 2023-12-23 DIAGNOSIS — G309 Alzheimer's disease, unspecified: Secondary | ICD-10-CM | POA: Diagnosis not present

## 2023-12-23 DIAGNOSIS — R41841 Cognitive communication deficit: Secondary | ICD-10-CM | POA: Diagnosis not present

## 2023-12-23 DIAGNOSIS — M6281 Muscle weakness (generalized): Secondary | ICD-10-CM | POA: Diagnosis not present

## 2023-12-23 DIAGNOSIS — M25551 Pain in right hip: Secondary | ICD-10-CM | POA: Diagnosis not present

## 2023-12-23 DIAGNOSIS — R2681 Unsteadiness on feet: Secondary | ICD-10-CM | POA: Diagnosis not present

## 2023-12-24 ENCOUNTER — Non-Acute Institutional Stay: Payer: Self-pay | Admitting: Orthopedic Surgery

## 2023-12-24 ENCOUNTER — Encounter: Payer: Self-pay | Admitting: Orthopedic Surgery

## 2023-12-24 DIAGNOSIS — R300 Dysuria: Secondary | ICD-10-CM | POA: Diagnosis not present

## 2023-12-24 DIAGNOSIS — N390 Urinary tract infection, site not specified: Secondary | ICD-10-CM | POA: Diagnosis not present

## 2023-12-24 NOTE — Progress Notes (Signed)
 Location:  [facility] Room Number: 6/A Place of Service:  ALF [phone]) Provider:  Octavia Heir, NP   Mahlon Gammon, MD  Patient Care Team: Mahlon Gammon, MD as PCP - General (Internal Medicine) Pa, [facility] (Ophthalmology) Dermatology, [facility] (Dermatology)  Extended Emergency Contact Information Primary Emergency Contact: Jarrett Ables [place] Home Phone: [phone] Mobile Phone: [phone] Relation: Daughter  Code Status:  DNR Goals of care: Advanced Directive information    03/12/2023   10:33 AM  Advanced Directives  Does Patient Have a Medical Advance Directive? Yes  Type of Estate agent of [place];Out of facility DNR (pink MOST or yellow form)  Does patient want to make changes to medical advance directive? No - Patient declined  Copy of Healthcare Power of Attorney in Chart? Yes - validated most recent copy scanned in chart (See row information)     Chief Complaint  Patient presents with   Acute Visit    Dysuria,malaise    HPI:  Pt is a 89 y.o. female seen today for acute visit due to dysuria.   She continues to reside in assisted living at [facility]. PMH: HTN, HLD, venous insufficiency, GERD, dementia, right hip surgery, and diverticulitis.   Poor historian due to dementia. She c/o dyuria x 1 day. Admits to feeling tired. Staff has been helping her more to bathroom. Denies back pain. No behaviors per nursing. Afebrile. Vitals stable.    Past Medical History:  Diagnosis Date   Anxiety    Cataract    Chest pain 02/16/2010   Myoview, negative for pharmacologic-stress induced ischemia, EF 74   Complication of anesthesia 1954   hard to wake up   Diverticulosis    DUB (dysfunctional uterine bleeding)    Dysrhythmia    h/o PVCs- none recently- no meds   Fall    GERD (gastroesophageal reflux disease)    Hearing reduced    Hypertension    no meds currently   Loss of  consciousness (HCC)    Memory loss    Osteoarthritis of left hip    Osteoporosis    Palpitation 02/02/2013   A CardioNet MCOT (mobile cardiac outpatient telemetry ): NSR with PACs; sinus tachycardia with ventricular trigeminy. Brief atrial run. Facet cardiac; ventricular trigeminy and accelerated idioventricular rhythm. No significant tachyarrhythmias.   Palpitations    Postmenopausal bleeding 11/01/2014   Unsteady gait    Varicose veins of both legs with pain    Past Surgical History:  Procedure Laterality Date   APPENDECTOMY     CATARACT EXTRACTION     both eyes   CESAREAN SECTION  1962, 1963, 5621,3086   x 4   COLONOSCOPY  01/14/2017   DIAGNOSTIC LAPAROSCOPY     ECTOPIC PREGNANCY SURGERY  1966   FRACTURE SURGERY     HIP SURGERY Right    HYSTEROSCOPY WITH D & C N/A 11/02/2014   Procedure: DILATATION AND CURETTAGE /HYSTEROSCOPY;  Surgeon: Sherian Rein, MD;  Location: [facility];  Service: Gynecology;  Laterality: N/A;   Lower Extremity Venous Dopplers  01/2013   No thrombus or thrombophlebitis. R Greater Saphenous Vein - enlarged without other insufficiency (6 mm); right and left Small Saphenous Vein: Patent no valve insufficiency.   TONSILLECTOMY     TONSILLECTOMY AND ADENOIDECTOMY     TRANSTHORACIC ECHOCARDIOGRAM  01/2013   Normal LV size and function. EF 55-60%. Grade 1 diastolic dysfunction. Elevated LV filling pressures. No regional W. May.  Allergies  Allergen Reactions   Evista [Raloxifene]    Fosamax [Alendronate]    Penicillins    Polyoxyethylene Lauryl Ether [Sorbitan]    Moxifloxacin Rash    Outpatient Encounter Medications as of 12/24/2023  Medication Sig   acetaminophen (TYLENOL 8 HOUR ARTHRITIS PAIN) 650 MG CR tablet Take 650 mg by mouth 2 (two) times daily.   acetaminophen (TYLENOL) 500 MG tablet Take 1,000 mg by mouth 2 (two) times daily as needed.   Calcium Carbonate (CALCIUM 600 PO) Take 1 tablet by mouth daily.   calcium carbonate (TUMS - DOSED  IN MG ELEMENTAL CALCIUM) 500 MG chewable tablet Chew 1 tablet by mouth 2 (two) times daily as needed for indigestion or heartburn.   diclofenac Sodium (VOLTAREN) 1 % GEL Apply 1 g topically 3 (three) times daily.   Emollient (CERAVE) LOTN Apply 1 application. topically daily.   fluticasone (FLONASE) 50 MCG/ACT nasal spray Place 2 sprays into both nostrils daily.   memantine (NAMENDA) 10 MG tablet Take 10 mg by mouth 2 (two) times daily.   MULTIPLE VITAMIN PO Take by mouth daily.   potassium chloride SA (KLOR-CON M) 20 MEQ tablet Take 30 mEq by mouth once.   torsemide (DEMADEX) 20 MG tablet Take 40 mg by mouth daily.   No facility-administered encounter medications on file as of 12/24/2023.    Review of Systems  Unable to perform ROS: Dementia    Immunization History  Administered Date(s) Administered   Fluad Quad(high Dose 65+) 07/08/2022   Influenza, Quadrivalent, Recombinant, Inj, Pf 06/18/2018, 05/04/2019, 06/15/2020   Influenza-Unspecified 09/15/2019, 07/08/2021, 07/08/2023   Moderna Covid-19 Vaccine Bivalent Booster 40yrs & up 07/21/2023   Moderna Sars-Covid-2 Vaccination 09/15/2019   PFIZER(Purple Top)SARS-COV-2 Vaccination 10/03/2019, 10/22/2019, 09/03/2020   Pfizer Covid-19 Vaccine Bivalent Booster 40yrs & up 06/04/2021   Pneumococcal Conjugate-13 05/28/2014   Pneumococcal Polysaccharide-23 07/15/1998, 06/15/2005   Tdap 05/26/2016   Unspecified SARS-COV-2 Vaccination 03/03/2022   Zoster, Live 05/18/2013, 05/28/2014   Pertinent  Health Maintenance Due  Topic Date Due   DEXA SCAN  Never done   INFLUENZA VACCINE  04/14/2024      10/23/2019    1:44 PM 05/14/2021    1:40 PM 05/22/2022    2:37 PM 07/23/2022    2:47 PM 05/28/2023   12:41 PM  Fall Risk  Falls in the past year? 0 1 0 0 1  Was there an injury with Fall?  0 0 0 1  Fall Risk Category Calculator  1 0 0 3  Fall Risk Category (Retired)  Low Low Low   (RETIRED) Patient Fall Risk Level  Moderate fall risk Low fall risk  Low fall risk   Patient at Risk for Falls Due to  History of fall(s);Impaired balance/gait History of fall(s) No Fall Risks History of fall(s);Impaired balance/gait;Impaired mobility  Fall risk Follow up  Falls evaluation completed;Education provided;Falls prevention discussed Falls evaluation completed;Education provided;Falls prevention discussed Falls evaluation completed Falls evaluation completed;Education provided;Falls prevention discussed   Functional Status Survey:    Vitals:   12/24/23 1542  BP: 126/80  Pulse: 62  Resp: 20  Temp: (!) 96.3 F (35.7 C)  SpO2: 99%  Weight: 153 lb 6.4 oz (69.6 kg)  Height: 5\' 5"  (1.651 m)   Body mass index is 25.53 kg/m. Physical Exam Vitals reviewed.  Constitutional:      General: She is not in acute distress. HENT:     Head: Normocephalic.  Eyes:     General:  Right eye: No discharge.        Left eye: No discharge.  Cardiovascular:     Rate and Rhythm: Normal rate and regular rhythm.     Pulses: Normal pulses.     Heart sounds: Normal heart sounds.  Pulmonary:     Effort: Pulmonary effort is normal.     Breath sounds: Normal breath sounds.  Abdominal:     General: Bowel sounds are normal.     Palpations: Abdomen is soft.     Tenderness: There is no right CVA tenderness or left CVA tenderness.  Musculoskeletal:     Right lower leg: Edema present.     Left lower leg: Edema present.     Comments: Non pitting  Skin:    General: Skin is warm.     Capillary Refill: Capillary refill takes less than 2 seconds.  Neurological:     General: No focal deficit present.     Mental Status: She is alert. Mental status is at baseline.     Motor: Weakness present.     Gait: Gait abnormal.  Psychiatric:     Comments: Alert to self/person, follows commands, able to express needs     Labs reviewed: Recent Labs    02/11/23 0000 02/18/23 0000 03/12/23 0000  NA 136* 136* 140  K 4.1 4.2 4.1  CL 106 103 107  CO2 24* 24* 25*  BUN  28* 27* 29*  CREATININE 0.9 1.0 1.2*  CALCIUM 9.4 9.7 8.9   Recent Labs    02/11/23 0000  AST 15  ALT 13  ALKPHOS 106  ALBUMIN 3.7   Recent Labs    02/11/23 0000  WBC 5.9  NEUTROABS 3,404.00  HGB 10.6*  HCT 32*   Lab Results  Component Value Date   TSH 4.09 10/21/2021   No results found for: "HGBA1C" Lab Results  Component Value Date   CHOL 156 08/27/2020   HDL 69 08/27/2020   LDLCALC 75 08/27/2020   TRIG 62 08/27/2020   CHOLHDL 2.8 02/15/2010    Significant Diagnostic Results in last 30 days:  No results found.  Assessment/Plan 1. Dysuria (Primary) - dysuria and malaise x 1 day - exam unremarkable, afebrile, no CVA pain - does not always drink fluids well - advised nursing to push fluids - UA/culture     Family/ staff Communication: plan discussed with patient and nurse  Labs/tests ordered:  UA/culture

## 2023-12-28 DIAGNOSIS — M25551 Pain in right hip: Secondary | ICD-10-CM | POA: Diagnosis not present

## 2023-12-28 DIAGNOSIS — G309 Alzheimer's disease, unspecified: Secondary | ICD-10-CM | POA: Diagnosis not present

## 2023-12-28 DIAGNOSIS — R2681 Unsteadiness on feet: Secondary | ICD-10-CM | POA: Diagnosis not present

## 2023-12-28 DIAGNOSIS — R41841 Cognitive communication deficit: Secondary | ICD-10-CM | POA: Diagnosis not present

## 2023-12-28 DIAGNOSIS — H9190 Unspecified hearing loss, unspecified ear: Secondary | ICD-10-CM | POA: Diagnosis not present

## 2023-12-28 DIAGNOSIS — M6281 Muscle weakness (generalized): Secondary | ICD-10-CM | POA: Diagnosis not present

## 2023-12-29 DIAGNOSIS — R41841 Cognitive communication deficit: Secondary | ICD-10-CM | POA: Diagnosis not present

## 2023-12-29 DIAGNOSIS — G309 Alzheimer's disease, unspecified: Secondary | ICD-10-CM | POA: Diagnosis not present

## 2023-12-29 DIAGNOSIS — M25551 Pain in right hip: Secondary | ICD-10-CM | POA: Diagnosis not present

## 2023-12-29 DIAGNOSIS — M6281 Muscle weakness (generalized): Secondary | ICD-10-CM | POA: Diagnosis not present

## 2023-12-29 DIAGNOSIS — R2681 Unsteadiness on feet: Secondary | ICD-10-CM | POA: Diagnosis not present

## 2023-12-29 DIAGNOSIS — H9190 Unspecified hearing loss, unspecified ear: Secondary | ICD-10-CM | POA: Diagnosis not present

## 2024-01-03 DIAGNOSIS — M25551 Pain in right hip: Secondary | ICD-10-CM | POA: Diagnosis not present

## 2024-01-03 DIAGNOSIS — M6281 Muscle weakness (generalized): Secondary | ICD-10-CM | POA: Diagnosis not present

## 2024-01-03 DIAGNOSIS — R2681 Unsteadiness on feet: Secondary | ICD-10-CM | POA: Diagnosis not present

## 2024-01-03 DIAGNOSIS — G309 Alzheimer's disease, unspecified: Secondary | ICD-10-CM | POA: Diagnosis not present

## 2024-01-03 DIAGNOSIS — H9190 Unspecified hearing loss, unspecified ear: Secondary | ICD-10-CM | POA: Diagnosis not present

## 2024-01-03 DIAGNOSIS — R41841 Cognitive communication deficit: Secondary | ICD-10-CM | POA: Diagnosis not present

## 2024-01-05 DIAGNOSIS — Z23 Encounter for immunization: Secondary | ICD-10-CM | POA: Diagnosis not present

## 2024-01-05 DIAGNOSIS — H9190 Unspecified hearing loss, unspecified ear: Secondary | ICD-10-CM | POA: Diagnosis not present

## 2024-01-05 DIAGNOSIS — M25551 Pain in right hip: Secondary | ICD-10-CM | POA: Diagnosis not present

## 2024-01-05 DIAGNOSIS — M6281 Muscle weakness (generalized): Secondary | ICD-10-CM | POA: Diagnosis not present

## 2024-01-05 DIAGNOSIS — R41841 Cognitive communication deficit: Secondary | ICD-10-CM | POA: Diagnosis not present

## 2024-01-05 DIAGNOSIS — G309 Alzheimer's disease, unspecified: Secondary | ICD-10-CM | POA: Diagnosis not present

## 2024-01-05 DIAGNOSIS — R2681 Unsteadiness on feet: Secondary | ICD-10-CM | POA: Diagnosis not present

## 2024-01-11 DIAGNOSIS — R2681 Unsteadiness on feet: Secondary | ICD-10-CM | POA: Diagnosis not present

## 2024-01-11 DIAGNOSIS — M25551 Pain in right hip: Secondary | ICD-10-CM | POA: Diagnosis not present

## 2024-01-11 DIAGNOSIS — M6281 Muscle weakness (generalized): Secondary | ICD-10-CM | POA: Diagnosis not present

## 2024-01-11 DIAGNOSIS — H9190 Unspecified hearing loss, unspecified ear: Secondary | ICD-10-CM | POA: Diagnosis not present

## 2024-01-11 DIAGNOSIS — R41841 Cognitive communication deficit: Secondary | ICD-10-CM | POA: Diagnosis not present

## 2024-01-11 DIAGNOSIS — G309 Alzheimer's disease, unspecified: Secondary | ICD-10-CM | POA: Diagnosis not present

## 2024-01-13 ENCOUNTER — Encounter: Payer: Self-pay | Admitting: Internal Medicine

## 2024-01-13 DIAGNOSIS — M25551 Pain in right hip: Secondary | ICD-10-CM | POA: Diagnosis not present

## 2024-01-13 DIAGNOSIS — M6281 Muscle weakness (generalized): Secondary | ICD-10-CM | POA: Diagnosis not present

## 2024-01-13 NOTE — Progress Notes (Signed)
 This encounter was created in error - please disregard.

## 2024-01-17 DIAGNOSIS — G309 Alzheimer's disease, unspecified: Secondary | ICD-10-CM | POA: Diagnosis not present

## 2024-01-17 DIAGNOSIS — E119 Type 2 diabetes mellitus without complications: Secondary | ICD-10-CM | POA: Diagnosis not present

## 2024-01-17 DIAGNOSIS — M9190 Juvenile osteochondrosis of hip and pelvis, unspecified, unspecified leg: Secondary | ICD-10-CM | POA: Diagnosis not present

## 2024-01-17 DIAGNOSIS — R2681 Unsteadiness on feet: Secondary | ICD-10-CM | POA: Diagnosis not present

## 2024-01-17 DIAGNOSIS — M1612 Unilateral primary osteoarthritis, left hip: Secondary | ICD-10-CM | POA: Diagnosis not present

## 2024-01-17 LAB — CBC AND DIFFERENTIAL
HCT: 35 — AB (ref 36–46)
Hemoglobin: 11.5 — AB (ref 12.0–16.0)
Neutrophils Absolute: 1768
Platelets: 293 10*3/uL (ref 150–400)
WBC: 4.2

## 2024-01-17 LAB — CBC: RBC: 3.89 (ref 3.87–5.11)

## 2024-01-18 DIAGNOSIS — M6281 Muscle weakness (generalized): Secondary | ICD-10-CM | POA: Diagnosis not present

## 2024-01-18 DIAGNOSIS — M25551 Pain in right hip: Secondary | ICD-10-CM | POA: Diagnosis not present

## 2024-01-19 DIAGNOSIS — M25551 Pain in right hip: Secondary | ICD-10-CM | POA: Diagnosis not present

## 2024-01-19 DIAGNOSIS — M6281 Muscle weakness (generalized): Secondary | ICD-10-CM | POA: Diagnosis not present

## 2024-01-20 ENCOUNTER — Non-Acute Institutional Stay: Payer: Self-pay | Admitting: Internal Medicine

## 2024-01-20 ENCOUNTER — Encounter: Payer: Self-pay | Admitting: Internal Medicine

## 2024-01-20 DIAGNOSIS — R600 Localized edema: Secondary | ICD-10-CM | POA: Diagnosis not present

## 2024-01-20 DIAGNOSIS — D631 Anemia in chronic kidney disease: Secondary | ICD-10-CM

## 2024-01-20 DIAGNOSIS — M6281 Muscle weakness (generalized): Secondary | ICD-10-CM | POA: Diagnosis not present

## 2024-01-20 DIAGNOSIS — G309 Alzheimer's disease, unspecified: Secondary | ICD-10-CM | POA: Diagnosis not present

## 2024-01-20 DIAGNOSIS — M25551 Pain in right hip: Secondary | ICD-10-CM | POA: Diagnosis not present

## 2024-01-20 DIAGNOSIS — F0280 Dementia in other diseases classified elsewhere without behavioral disturbance: Secondary | ICD-10-CM | POA: Diagnosis not present

## 2024-01-20 DIAGNOSIS — N1832 Chronic kidney disease, stage 3b: Secondary | ICD-10-CM | POA: Diagnosis not present

## 2024-01-20 DIAGNOSIS — F0150 Vascular dementia without behavioral disturbance: Secondary | ICD-10-CM | POA: Diagnosis not present

## 2024-01-20 DIAGNOSIS — R2681 Unsteadiness on feet: Secondary | ICD-10-CM

## 2024-01-20 NOTE — Progress Notes (Signed)
 Location:  [facility] Room Number: 6 A Place of Service:  ALF [phone]) Provider: Marguerite Shiley, MD   Marguerite Shiley, MD  Patient Care Team: Marguerite Shiley, MD as PCP - General (Internal Medicine) Pa, [facility] (Ophthalmology) Dermatology, [facility] (Dermatology)  Extended Emergency Contact Information Primary Emergency Contact: Jones,Beth  [place] Home Phone: [phone] Mobile Phone: [phone] Relation: Daughter  Code Status:  DNR Goals of care: Advanced Directive information    03/12/2023   10:33 AM  Advanced Directives  Does Patient Have a Medical Advance Directive? Yes  Type of Estate agent of [place];Out of facility DNR (pink MOST or yellow form)  Does patient want to make changes to medical advance directive? No - Patient declined  Copy of Healthcare Power of Attorney in Chart? Yes - validated most recent copy scanned in chart (See row information)     Chief Complaint  Patient presents with   routine visit    HPI:  Pt is a 89 y.o. female seen today for medical management of chronic diseases.   Lives in AL [facility]   Has h/o Dementia, HLD,Hypertension,  Diverticulitis and GERD   She has Anemia Iron studies were good and B 12 was normal Hgb 11.5  LE edema Controlled with Torsemide BNP is 137  Not walking anymore Patient was working with OT She is now using Wheelchair for getting around Not walking due to arthritis She is able to safely do her Transfers inspite of some Cognition issues    Otherwise She is stable. No new Nursing issues. No Behavior issues Doing her ADLS No Falls Wt Readings from Last 3 Encounters:  01/20/24 150 lb 3.2 oz (68.1 kg)  12/24/23 153 lb 6.4 oz (69.6 kg)  12/16/23 153 lb 6.4 oz (69.6 kg)       Past Medical History:  Diagnosis Date   Anxiety    Cataract    Chest pain 02/16/2010   Myoview, negative for pharmacologic-stress induced  ischemia, EF 74   Complication of anesthesia 1954   hard to wake up   Diverticulosis    DUB (dysfunctional uterine bleeding)    Dysrhythmia    h/o PVCs- none recently- no meds   Fall    GERD (gastroesophageal reflux disease)    Hearing reduced    Hypertension    no meds currently   Loss of consciousness (HCC)    Memory loss    Osteoarthritis of left hip    Osteoporosis    Palpitation 02/02/2013   A CardioNet MCOT (mobile cardiac outpatient telemetry ): NSR with PACs; sinus tachycardia with ventricular trigeminy. Brief atrial run. Facet cardiac; ventricular trigeminy and accelerated idioventricular rhythm. No significant tachyarrhythmias.   Palpitations    Postmenopausal bleeding 11/01/2014   Unsteady gait    Varicose veins of both legs with pain    Past Surgical History:  Procedure Laterality Date   APPENDECTOMY     CATARACT EXTRACTION     both eyes   CESAREAN SECTION  1962, 1963, [id number]   x 4   COLONOSCOPY  01/14/2017   DIAGNOSTIC LAPAROSCOPY     ECTOPIC PREGNANCY SURGERY  1966   FRACTURE SURGERY     HIP SURGERY Right    HYSTEROSCOPY WITH D & C N/A 11/02/2014   Procedure: DILATATION AND CURETTAGE /HYSTEROSCOPY;  Surgeon: Margaretmary Shaver, MD;  Location: [facility];  Service: Gynecology;  Laterality: N/A;   Lower Extremity Venous Dopplers  01/2013  No thrombus or thrombophlebitis. R Greater Saphenous Vein - enlarged without other insufficiency (6 mm); right and left Small Saphenous Vein: Patent no valve insufficiency.   TONSILLECTOMY     TONSILLECTOMY AND ADENOIDECTOMY     TRANSTHORACIC ECHOCARDIOGRAM  01/2013   Normal LV size and function. EF 55-60%. Grade 1 diastolic dysfunction. Elevated LV filling pressures. No regional W. May.    Allergies  Allergen Reactions   Evista [Raloxifene]    Fosamax [Alendronate]    Penicillins    Polyoxyethylene Lauryl Ether [Sorbitan]    Moxifloxacin Rash    Outpatient Encounter Medications as of 01/20/2024  Medication Sig    acetaminophen  (TYLENOL  8 HOUR ARTHRITIS PAIN) 650 MG CR tablet Take 650 mg by mouth 2 (two) times daily.   acetaminophen  (TYLENOL ) 500 MG tablet Take 1,000 mg by mouth 2 (two) times daily as needed.   Calcium Carbonate (CALCIUM 600 PO) Take 1 tablet by mouth daily.   Emollient (CERAVE) LOTN Apply 1 application. topically daily.   fluticasone (FLONASE) 50 MCG/ACT nasal spray Place 2 sprays into both nostrils daily.   magnesium hydroxide (MILK OF MAGNESIA) 400 MG/5ML suspension Take 30 mLs by mouth daily as needed for mild constipation.   memantine  (NAMENDA ) 10 MG tablet Take 10 mg by mouth 2 (two) times daily.   MULTIPLE VITAMIN PO Take by mouth daily.   potassium chloride SA (KLOR-CON M) 20 MEQ tablet Take 30 mEq by mouth once.   torsemide (DEMADEX) 20 MG tablet Take 40 mg by mouth daily.   calcium carbonate (TUMS - DOSED IN MG ELEMENTAL CALCIUM) 500 MG chewable tablet Chew 1 tablet by mouth 2 (two) times daily as needed for indigestion or heartburn. (Patient not taking: Reported on 01/20/2024)   diclofenac Sodium (VOLTAREN) 1 % GEL Apply 1 g topically 3 (three) times daily. (Patient not taking: Reported on 01/20/2024)   No facility-administered encounter medications on file as of 01/20/2024.    Review of Systems  Constitutional:  Negative for activity change and appetite change.  HENT: Negative.    Respiratory:  Negative for cough and shortness of breath.   Cardiovascular:  Positive for leg swelling.  Gastrointestinal:  Negative for constipation.  Genitourinary: Negative.   Musculoskeletal:  Positive for arthralgias and gait problem. Negative for myalgias.  Skin: Negative.   Neurological:  Negative for dizziness and weakness.  Psychiatric/Behavioral:  Positive for confusion. Negative for dysphoric mood and sleep disturbance.     Immunization History  Administered Date(s) Administered   Fluad Quad(high Dose 65+) 07/08/2022   Influenza, High Dose Seasonal PF 07/13/2023   Influenza,  Quadrivalent, Recombinant, Inj, Pf 06/18/2018, 05/04/2019, 06/15/2020   Influenza-Unspecified 09/15/2019, 07/08/2021, 07/08/2023   Moderna Covid-19 Vaccine  Bivalent Booster 44yrs & up 07/21/2023   Moderna Sars-Covid-2 Vaccination 09/15/2019   PFIZER(Purple Top)SARS-COV-2 Vaccination 10/03/2019, 10/22/2019, 09/03/2020   PPD Test 10/09/2020   Pfizer Covid-19 Vaccine Bivalent Booster 43yrs & up 06/04/2021   Pneumococcal Conjugate-13 05/28/2014   Pneumococcal Polysaccharide-23 07/15/1998, 06/15/2005   RSV,unspecified 08/29/2022   Tdap 05/26/2016   Unspecified SARS-COV-2 Vaccination 03/03/2022   Zoster, Live 05/18/2013, 05/28/2014   Pertinent  Health Maintenance Due  Topic Date Due   DEXA SCAN  Never done   INFLUENZA VACCINE  04/14/2024      05/14/2021    1:40 PM 05/22/2022    2:37 PM 07/23/2022    2:47 PM 05/28/2023   12:41 PM 12/24/2023    3:54 PM  Fall Risk  Falls in the past year? 1 0 0  1 1  Was there an injury with Fall? 0 0 0 1 0  Fall Risk Category Calculator 1 0 0 3 1  Fall Risk Category (Retired) Low Low Low    (RETIRED) Patient Fall Risk Level Moderate fall risk Low fall risk Low fall risk    Patient at Risk for Falls Due to History of fall(s);Impaired balance/gait History of fall(s) No Fall Risks History of fall(s);Impaired balance/gait;Impaired mobility History of fall(s);Impaired balance/gait  Fall risk Follow up Falls evaluation completed;Education provided;Falls prevention discussed Falls evaluation completed;Education provided;Falls prevention discussed Falls evaluation completed Falls evaluation completed;Education provided;Falls prevention discussed Falls evaluation completed   Functional Status Survey:    Vitals:   01/20/24 1122  BP: 109/67  Pulse: 66  Resp: 18  Temp: 97.9 F (36.6 C)  SpO2: 99%  Weight: 150 lb 3.2 oz (68.1 kg)  Height: 5\' 5"  (1.651 m)   Body mass index is 24.99 kg/m. Physical Exam Vitals reviewed.  Constitutional:      Appearance: Normal  appearance.  HENT:     Head: Normocephalic.     Nose: Nose normal.     Mouth/Throat:     Mouth: Mucous membranes are moist.     Pharynx: Oropharynx is clear.  Eyes:     Pupils: Pupils are equal, round, and reactive to light.  Cardiovascular:     Rate and Rhythm: Normal rate and regular rhythm.     Pulses: Normal pulses.     Heart sounds: Normal heart sounds. No murmur heard. Pulmonary:     Effort: Pulmonary effort is normal.     Breath sounds: Normal breath sounds.  Abdominal:     General: Abdomen is flat. Bowel sounds are normal.     Palpations: Abdomen is soft.  Musculoskeletal:        General: Swelling present.     Cervical back: Neck supple.  Skin:    General: Skin is warm.  Neurological:     General: No focal deficit present.     Mental Status: She is alert and oriented to person, place, and time.  Psychiatric:        Mood and Affect: Mood normal.        Thought Content: Thought content normal.     Labs reviewed: Recent Labs    03/12/23 0000 08/09/23 0000 12/17/23 0000  NA 140 140 140  K 4.1 4.4 4.3  CL 107 107 108  CO2 25* 25* 25*  BUN 29* 34* 37*  CREATININE 1.2* 1.4* 1.3*  CALCIUM 8.9 9.1 8.9   Recent Labs    02/11/23 0000 08/09/23 0000 12/17/23 0000  AST 15 14 17   ALT 13 10 14   ALKPHOS 106 118 117  ALBUMIN 3.7 3.4* 3.2*   Recent Labs    02/11/23 0000 08/09/23 0000 12/17/23 0000 01/17/24 0000  WBC 5.9 4.6 5.0 4.2  NEUTROABS 3,404.00  --  2,315.00 1,768.00  HGB 10.6* 10.4* 9.9* 11.5*  HCT 32* 31* 30* 35*  PLT  --  231 249 293   Lab Results  Component Value Date   TSH 4.09 10/21/2021   No results found for: "HGBA1C" Lab Results  Component Value Date   CHOL 156 08/27/2020   HDL 69 08/27/2020   LDLCALC 75 08/27/2020   TRIG 62 08/27/2020   CHOLHDL 2.8 02/15/2010    Significant Diagnostic Results in last 30 days:  No results found.  Assessment/Plan 1. Bilateral leg edema (Primary) Torsemide BNP 137  2. 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 Stage 3b chronic kidney disease (HCC) Creat stable  4. Unstable gait Working with therapy Now using wheelchair  5. Anemia due to stage 3b chronic kidney disease (HCC) Iron stores and B 12 normal Iron sats 21%   Family/ staff Communication:   Labs/tests ordered:

## 2024-01-27 DIAGNOSIS — M25551 Pain in right hip: Secondary | ICD-10-CM | POA: Diagnosis not present

## 2024-01-27 DIAGNOSIS — M6281 Muscle weakness (generalized): Secondary | ICD-10-CM | POA: Diagnosis not present

## 2024-01-31 DIAGNOSIS — M6281 Muscle weakness (generalized): Secondary | ICD-10-CM | POA: Diagnosis not present

## 2024-01-31 DIAGNOSIS — M25551 Pain in right hip: Secondary | ICD-10-CM | POA: Diagnosis not present

## 2024-02-02 DIAGNOSIS — M25551 Pain in right hip: Secondary | ICD-10-CM | POA: Diagnosis not present

## 2024-02-02 DIAGNOSIS — M6281 Muscle weakness (generalized): Secondary | ICD-10-CM | POA: Diagnosis not present

## 2024-02-07 DIAGNOSIS — M25551 Pain in right hip: Secondary | ICD-10-CM | POA: Diagnosis not present

## 2024-02-07 DIAGNOSIS — M6281 Muscle weakness (generalized): Secondary | ICD-10-CM | POA: Diagnosis not present

## 2024-02-09 DIAGNOSIS — M6281 Muscle weakness (generalized): Secondary | ICD-10-CM | POA: Diagnosis not present

## 2024-02-09 DIAGNOSIS — M25551 Pain in right hip: Secondary | ICD-10-CM | POA: Diagnosis not present

## 2024-02-14 DIAGNOSIS — M6281 Muscle weakness (generalized): Secondary | ICD-10-CM | POA: Diagnosis not present

## 2024-02-14 DIAGNOSIS — M25551 Pain in right hip: Secondary | ICD-10-CM | POA: Diagnosis not present

## 2024-02-16 DIAGNOSIS — M6281 Muscle weakness (generalized): Secondary | ICD-10-CM | POA: Diagnosis not present

## 2024-02-16 DIAGNOSIS — M25551 Pain in right hip: Secondary | ICD-10-CM | POA: Diagnosis not present

## 2024-02-18 DIAGNOSIS — M6281 Muscle weakness (generalized): Secondary | ICD-10-CM | POA: Diagnosis not present

## 2024-02-18 DIAGNOSIS — M25551 Pain in right hip: Secondary | ICD-10-CM | POA: Diagnosis not present

## 2024-02-23 DIAGNOSIS — M6281 Muscle weakness (generalized): Secondary | ICD-10-CM | POA: Diagnosis not present

## 2024-02-23 DIAGNOSIS — M25551 Pain in right hip: Secondary | ICD-10-CM | POA: Diagnosis not present

## 2024-02-24 DIAGNOSIS — M25551 Pain in right hip: Secondary | ICD-10-CM | POA: Diagnosis not present

## 2024-02-24 DIAGNOSIS — M6281 Muscle weakness (generalized): Secondary | ICD-10-CM | POA: Diagnosis not present

## 2024-02-25 ENCOUNTER — Non-Acute Institutional Stay: Admitting: Orthopedic Surgery

## 2024-02-25 ENCOUNTER — Encounter: Payer: Self-pay | Admitting: Orthopedic Surgery

## 2024-02-25 DIAGNOSIS — M79604 Pain in right leg: Secondary | ICD-10-CM

## 2024-02-25 DIAGNOSIS — F0390 Unspecified dementia without behavioral disturbance: Secondary | ICD-10-CM | POA: Diagnosis not present

## 2024-02-25 MED ORDER — PREDNISONE 20 MG PO TABS: ORAL_TABLET | ORAL | Status: AC

## 2024-02-25 NOTE — Progress Notes (Signed)
 Location:  [facility] Room Number: 6/A Place of Service:  ALF [phone]) Provider:  Arnetha Bhat, NP   Marguerite Shiley, MD  Patient Care Team: Marguerite Shiley, MD as PCP - General (Internal Medicine) Pa, [facility] (Ophthalmology) Dermatology, [facility] (Dermatology)  Extended Emergency Contact Information Primary Emergency Contact: Jones,Beth Address: [address]          [place], [place] [address] [place] Home Phone: [phone] Mobile Phone: [phone] Relation: Daughter  Code Status:  DNR Goals of care: Advanced Directive information    03/12/2023   10:33 AM  Advanced Directives  Does Patient Have a Medical Advance Directive? Yes  Type of Estate agent of [place];Out of facility DNR (pink MOST or yellow form)  Does patient want to make changes to medical advance directive? No - Patient declined  Copy of Healthcare Power of Attorney in Chart? Yes - validated most recent copy scanned in chart (See row information)     Chief Complaint  Patient presents with   Acute Visit    Right leg pain    HPI:  Pt is a 89 y.o. female seen today for acute visit due to right leg pain.   She continues to reside in assisted living at [facility]. PMH: HTN, HLD, venous insufficiency, GERD, dementia, right hip surgery, and diverticulitis.   Poor historian due to dementia. H/o right hip surgery. Family and nursing report increased pain with movement. She ambulates with w/c. Pain to right hip and knee with transfers. No recent injury or fall. She is given tylenol  twice daily. Afebrile. Vitals stable.   Past Medical History:  Diagnosis Date   Anxiety    Cataract    Chest pain 02/16/2010   Myoview, negative for pharmacologic-stress induced ischemia, EF 74   Complication of anesthesia 1954   hard to wake up   Diverticulosis    DUB (dysfunctional uterine bleeding)    Dysrhythmia    h/o PVCs- none recently- no meds    Fall    GERD (gastroesophageal reflux disease)    Hearing reduced    Hypertension    no meds currently   Loss of consciousness (HCC)    Memory loss    Osteoarthritis of left hip    Osteoporosis    Palpitation 02/02/2013   A CardioNet MCOT (mobile cardiac outpatient telemetry ): NSR with PACs; sinus tachycardia with ventricular trigeminy. Brief atrial run. Facet cardiac; ventricular trigeminy and accelerated idioventricular rhythm. No significant tachyarrhythmias.   Palpitations    Postmenopausal bleeding 11/01/2014   Unsteady gait    Varicose veins of both legs with pain    Past Surgical History:  Procedure Laterality Date   APPENDECTOMY     CATARACT EXTRACTION     both eyes   CESAREAN SECTION  1962, 1963, [id number]   x 4   COLONOSCOPY  01/14/2017   DIAGNOSTIC LAPAROSCOPY     ECTOPIC PREGNANCY SURGERY  1966   FRACTURE SURGERY     HIP SURGERY Right    HYSTEROSCOPY WITH D & C N/A 11/02/2014   Procedure: DILATATION AND CURETTAGE /HYSTEROSCOPY;  Surgeon: Margaretmary Shaver, MD;  Location: [facility];  Service: Gynecology;  Laterality: N/A;   Lower Extremity Venous Dopplers  01/2013   No thrombus or thrombophlebitis. R Greater Saphenous Vein - enlarged without other insufficiency (6 mm); right and left Small Saphenous Vein: Patent no valve insufficiency.   TONSILLECTOMY     TONSILLECTOMY AND ADENOIDECTOMY  TRANSTHORACIC ECHOCARDIOGRAM  01/2013   Normal LV size and function. EF 55-60%. Grade 1 diastolic dysfunction. Elevated LV filling pressures. No regional W. May.    Allergies  Allergen Reactions   Evista [Raloxifene]    Fosamax [Alendronate]    Penicillins    Polyoxyethylene Lauryl Ether [Sorbitan]    Moxifloxacin Rash    Outpatient Encounter Medications as of 02/25/2024  Medication Sig   acetaminophen  (TYLENOL  8 HOUR ARTHRITIS PAIN) 650 MG CR tablet Take 650 mg by mouth 2 (two) times daily.   acetaminophen  (TYLENOL ) 500 MG tablet Take 1,000 mg by mouth 2 (two) times  daily as needed.   Calcium Carbonate (CALCIUM 600 PO) Take 1 tablet by mouth daily.   Emollient (CERAVE) LOTN Apply 1 application. topically daily.   fluticasone (FLONASE) 50 MCG/ACT nasal spray Place 2 sprays into both nostrils daily.   magnesium hydroxide (MILK OF MAGNESIA) 400 MG/5ML suspension Take 30 mLs by mouth daily as needed for mild constipation.   memantine  (NAMENDA ) 10 MG tablet Take 10 mg by mouth 2 (two) times daily.   MULTIPLE VITAMIN PO Take by mouth daily.   potassium chloride SA (KLOR-CON M) 20 MEQ tablet Take 30 mEq by mouth once.   torsemide (DEMADEX) 20 MG tablet Take 40 mg by mouth daily.   No facility-administered encounter medications on file as of 02/25/2024.    Review of Systems  Unable to perform ROS: Dementia    Immunization History  Administered Date(s) Administered   Fluad Quad(high Dose 65+) 07/08/2022   Influenza, High Dose Seasonal PF 07/13/2023   Influenza, Quadrivalent, Recombinant, Inj, Pf 06/18/2018, 05/04/2019, 06/15/2020   Influenza-Unspecified 09/15/2019, 07/08/2021, 07/08/2023   Moderna Covid-19 Vaccine  Bivalent Booster 16yrs & up 07/21/2023   Moderna Sars-Covid-2 Vaccination 09/15/2019   PFIZER(Purple Top)SARS-COV-2 Vaccination 10/03/2019, 10/22/2019, 09/03/2020   PPD Test 10/09/2020   Pfizer Covid-19 Vaccine Bivalent Booster 57yrs & up 06/04/2021   Pneumococcal Conjugate-13 05/28/2014   Pneumococcal Polysaccharide-23 07/15/1998, 06/15/2005   RSV,unspecified 08/29/2022   Tdap 05/26/2016   Unspecified SARS-COV-2 Vaccination 03/03/2022   Zoster, Live 05/18/2013, 05/28/2014   Pertinent  Health Maintenance Due  Topic Date Due   DEXA SCAN  Never done   INFLUENZA VACCINE  04/14/2024      05/14/2021    1:40 PM 05/22/2022    2:37 PM 07/23/2022    2:47 PM 05/28/2023   12:41 PM 12/24/2023    3:54 PM  Fall Risk  Falls in the past year? 1 0 0 1 1  Was there an injury with Fall? 0 0 0 1 0  Fall Risk Category Calculator 1 0 0 3 1  Fall Risk  Category (Retired) Low  Low  Low     (RETIRED) Patient Fall Risk Level Moderate fall risk  Low fall risk  Low fall risk     Patient at Risk for Falls Due to History of fall(s);Impaired balance/gait History of fall(s) No Fall Risks History of fall(s);Impaired balance/gait;Impaired mobility History of fall(s);Impaired balance/gait  Fall risk Follow up Falls evaluation completed;Education provided;Falls prevention discussed  Falls evaluation completed;Education provided;Falls prevention discussed  Falls evaluation completed  Falls evaluation completed;Education provided;Falls prevention discussed Falls evaluation completed     Data saved with a previous flowsheet row definition   Functional Status Survey:    Vitals:   02/25/24 1411  BP: 106/70  Resp: 20  Temp: 97.9 F (36.6 C)  SpO2: 95%  Weight: 153 lb 6.4 oz (69.6 kg)  Height: 5' 5 (1.651 m)  Body mass index is 25.53 kg/m. Physical Exam Vitals reviewed.  Constitutional:      General: She is not in acute distress. HENT:     Head: Normocephalic and atraumatic.   Eyes:     General:        Right eye: No discharge.        Left eye: No discharge.    Cardiovascular:     Rate and Rhythm: Normal rate and regular rhythm.     Pulses: Normal pulses.     Heart sounds: Normal heart sounds.  Pulmonary:     Effort: Pulmonary effort is normal.     Breath sounds: Normal breath sounds.  Abdominal:     Palpations: Abdomen is soft.   Musculoskeletal:        General: Tenderness present.     Cervical back: Neck supple.     Right hip: Tenderness present. No crepitus. Decreased range of motion. Normal strength.     Right knee: No swelling, deformity or crepitus. Decreased range of motion. Tenderness present over the MCL.   Skin:    General: Skin is warm.     Capillary Refill: Capillary refill takes less than 2 seconds.   Neurological:     General: No focal deficit present.     Mental Status: She is alert. Mental status is at  baseline.     Motor: Weakness present.     Gait: Gait abnormal.   Psychiatric:        Mood and Affect: Mood normal.     Labs reviewed: Recent Labs    03/12/23 0000 08/09/23 0000 12/17/23 0000  NA 140 140 140  K 4.1 4.4 4.3  CL 107 107 108  CO2 25* 25* 25*  BUN 29* 34* 37*  CREATININE 1.2* 1.4* 1.3*  CALCIUM 8.9 9.1 8.9   Recent Labs    08/09/23 0000 12/17/23 0000  AST 14 17  ALT 10 14  ALKPHOS 118 117  ALBUMIN 3.4* 3.2*   Recent Labs    08/09/23 0000 12/17/23 0000 01/17/24 0000  WBC 4.6 5.0 4.2  NEUTROABS  --  2,315.00 1,768.00  HGB 10.4* 9.9* 11.5*  HCT 31* 30* 35*  PLT 231 249 293   Lab Results  Component Value Date   TSH 4.09 10/21/2021   No results found for: HGBA1C Lab Results  Component Value Date   CHOL 156 08/27/2020   HDL 69 08/27/2020   LDLCALC 75 08/27/2020   TRIG 62 08/27/2020   CHOLHDL 2.8 02/15/2010    Significant Diagnostic Results in last 30 days:  No results found.  Assessment/Plan 1. Right leg pain (Primary) - involves right hip and knee - increased pain with transfers - h/o right hip surgery - limited ROM to right hip and knee, mild tenderness  - no recent injury or fall  - start prednisone  taper  - if pain ongoing> consider repeat right hip xray and tramadol prn  2. Dementia without behavioral disturbance (HCC) - no behaviors - more issues with mobility> see above - weight stable - able to perform some ADLs - cont Namenda    Family/ staff Communication: plan discussed with patient and nurse  Labs/tests ordered: none

## 2024-03-15 ENCOUNTER — Non-Acute Institutional Stay: Payer: Self-pay | Admitting: Orthopedic Surgery

## 2024-03-15 ENCOUNTER — Encounter: Payer: Self-pay | Admitting: Orthopedic Surgery

## 2024-03-15 DIAGNOSIS — M25552 Pain in left hip: Secondary | ICD-10-CM

## 2024-03-15 DIAGNOSIS — F0390 Unspecified dementia without behavioral disturbance: Secondary | ICD-10-CM

## 2024-03-15 MED ORDER — TRAMADOL HCL 50 MG PO TABS
25.0000 mg | ORAL_TABLET | Freq: Two times a day (BID) | ORAL | 0 refills | Status: AC | PRN
Start: 2024-03-15 — End: 2024-03-29

## 2024-03-15 MED ORDER — ACETAMINOPHEN 500 MG PO TABS: 1000.0000 mg | ORAL_TABLET | Freq: Every day | ORAL | Status: DC | PRN

## 2024-03-15 NOTE — Progress Notes (Signed)
 Location:  [facility] Room Number: 6 A Place of Service:  ALF [phone]) Provider:  Greig Cluster, NP    Patient Care Team: Charlanne Fredia CROME, MD as PCP - General (Internal Medicine) Pa, [facility] (Ophthalmology) Dermatology, [facility] (Dermatology)  Extended Emergency Contact Information Primary Emergency Contact: Jones,Beth Address: [address]          [place], [place] [address] [place] Home Phone: [phone] Mobile Phone: [phone] Relation: Daughter  Code Status:  DNR Goals of care: Advanced Directive information    03/12/2023   10:33 AM  Advanced Directives  Does Patient Have a Medical Advance Directive? Yes  Type of Estate agent of [place];Out of facility DNR (pink MOST or yellow form)  Does patient want to make changes to medical advance directive? No - Patient declined  Copy of Healthcare Power of Attorney in Chart? Yes - validated most recent copy scanned in chart (See row information)     Chief Complaint  Patient presents with   Hip Pain    Left hip pain    HPI:  Pt is a 89 y.o. female seen today for acute visit due to left hip pain.   She continues to reside in assisted living at [facility]. PMH: HTN, HLD, venous insufficiency, GERD, dementia, right hip surgery, and diverticulitis.   Poor historian due to dementia. Nursing reports increased difficulty transferring due to pain. Patient admits to left hip pain. She tried to stand, but could not due to pain. Unable to rate pain, but described it as sharp. No recent fall or injury. She ambulates with wheelchair. H/o right hip surgery. She is currently taking tylenol  650 mg twice daily. 06/21 she was prescribed a short taper of prednisone . Nursing reports she has some relief for a few days. Over all decline in performing ADLs and requiring more assistance from nursing. Afebrile. Vitals stable.    Past Medical History:  Diagnosis Date   Anxiety     Cataract    Chest pain 02/16/2010   Myoview, negative for pharmacologic-stress induced ischemia, EF 74   Complication of anesthesia 1954   hard to wake up   Diverticulosis    DUB (dysfunctional uterine bleeding)    Dysrhythmia    h/o PVCs- none recently- no meds   Fall    GERD (gastroesophageal reflux disease)    Hearing reduced    Hypertension    no meds currently   Loss of consciousness (HCC)    Memory loss    Osteoarthritis of left hip    Osteoporosis    Palpitation 02/02/2013   A CardioNet MCOT (mobile cardiac outpatient telemetry ): NSR with PACs; sinus tachycardia with ventricular trigeminy. Brief atrial run. Facet cardiac; ventricular trigeminy and accelerated idioventricular rhythm. No significant tachyarrhythmias.   Palpitations    Postmenopausal bleeding 11/01/2014   Unsteady gait    Varicose veins of both legs with pain    Past Surgical History:  Procedure Laterality Date   APPENDECTOMY     CATARACT EXTRACTION     both eyes   CESAREAN SECTION  1962, 1963, [id number]   x 4   COLONOSCOPY  01/14/2017   DIAGNOSTIC LAPAROSCOPY     ECTOPIC PREGNANCY SURGERY  1966   FRACTURE SURGERY     HIP SURGERY Right    HYSTEROSCOPY WITH D & C N/A 11/02/2014   Procedure: DILATATION AND CURETTAGE /HYSTEROSCOPY;  Surgeon: Ezzie Buba, MD;  Location: [facility];  Service: Gynecology;  Laterality: N/A;   Lower Extremity Venous Dopplers  01/2013   No thrombus or thrombophlebitis. R Greater Saphenous Vein - enlarged without other insufficiency (6 mm); right and left Small Saphenous Vein: Patent no valve insufficiency.   TONSILLECTOMY     TONSILLECTOMY AND ADENOIDECTOMY     TRANSTHORACIC ECHOCARDIOGRAM  01/2013   Normal LV size and function. EF 55-60%. Grade 1 diastolic dysfunction. Elevated LV filling pressures. No regional W. May.    Allergies  Allergen Reactions   Evista [Raloxifene]    Fosamax [Alendronate]    Penicillins    Polyoxyethylene Lauryl Ether [Sorbitan]     Moxifloxacin Rash    Outpatient Encounter Medications as of 03/15/2024  Medication Sig   acetaminophen  (TYLENOL  8 HOUR ARTHRITIS PAIN) 650 MG CR tablet Take 650 mg by mouth 2 (two) times daily.   acetaminophen  (TYLENOL ) 500 MG tablet Take 1,000 mg by mouth 2 (two) times daily as needed.   Calcium Carbonate (CALCIUM 600 PO) Take 1 tablet by mouth daily.   Emollient (CERAVE) LOTN Apply 1 application. topically daily.   fluticasone (FLONASE) 50 MCG/ACT nasal spray Place 2 sprays into both nostrils daily.   magnesium hydroxide (MILK OF MAGNESIA) 400 MG/5ML suspension Take 30 mLs by mouth daily as needed for mild constipation.   memantine  (NAMENDA ) 10 MG tablet Take 10 mg by mouth 2 (two) times daily.   MULTIPLE VITAMIN PO Take by mouth daily.   potassium chloride SA (KLOR-CON M) 20 MEQ tablet Take 30 mEq by mouth once.   torsemide (DEMADEX) 20 MG tablet Take 40 mg by mouth daily.   No facility-administered encounter medications on file as of 03/15/2024.    Review of Systems  Unable to perform ROS: Dementia    Immunization History  Administered Date(s) Administered   Fluad Quad(high Dose 65+) 07/08/2022   Influenza, High Dose Seasonal PF 07/13/2023   Influenza, Quadrivalent, Recombinant, Inj, Pf 06/18/2018, 05/04/2019, 06/15/2020   Influenza-Unspecified 09/15/2019, 07/08/2021, 07/08/2023   Moderna Covid-19 Vaccine  Bivalent Booster 48yrs & up 07/21/2023   Moderna Sars-Covid-2 Vaccination 09/15/2019   PFIZER(Purple Top)SARS-COV-2 Vaccination 10/03/2019, 10/22/2019, 09/03/2020   PPD Test 10/09/2020   Pfizer Covid-19 Vaccine Bivalent Booster 56yrs & up 06/04/2021   Pneumococcal Conjugate-13 05/28/2014   Pneumococcal Polysaccharide-23 07/15/1998, 06/15/2005   RSV,unspecified 08/29/2022   Tdap 05/26/2016   Unspecified SARS-COV-2 Vaccination 03/03/2022   Zoster, Live 05/18/2013, 05/28/2014   Pertinent  Health Maintenance Due  Topic Date Due   DEXA SCAN  Never done   INFLUENZA VACCINE   04/14/2024      05/22/2022    2:37 PM 07/23/2022    2:47 PM 05/28/2023   12:41 PM 12/24/2023    3:54 PM 02/25/2024    2:24 PM  Fall Risk  Falls in the past year? 0 0 1 1 1   Was there an injury with Fall? 0 0 1 0 0  Fall Risk Category Calculator 0 0 3 1 1   Fall Risk Category (Retired) Low  Low      (RETIRED) Patient Fall Risk Level Low fall risk  Low fall risk      Patient at Risk for Falls Due to History of fall(s) No Fall Risks History of fall(s);Impaired balance/gait;Impaired mobility History of fall(s);Impaired balance/gait History of fall(s);Impaired balance/gait  Fall risk Follow up Falls evaluation completed;Education provided;Falls prevention discussed  Falls evaluation completed  Falls evaluation completed;Education provided;Falls prevention discussed Falls evaluation completed Falls evaluation completed;Education provided     Data saved with a previous flowsheet row definition  Functional Status Survey:    Vitals:   03/15/24 1123  BP: 98/71  Pulse: 81  Resp: 20  Temp: 97.8 F (36.6 C)  SpO2: 95%  Weight: 147 lb (66.7 kg)  Height: 5' 5 (1.651 m)   Body mass index is 24.46 kg/m. Physical Exam Vitals reviewed.  Constitutional:      General: She is not in acute distress. HENT:     Head: Normocephalic.  Eyes:     General:        Right eye: No discharge.        Left eye: No discharge.  Cardiovascular:     Rate and Rhythm: Normal rate and regular rhythm.     Pulses: Normal pulses.     Heart sounds: Normal heart sounds.  Pulmonary:     Effort: Pulmonary effort is normal.     Breath sounds: Normal breath sounds.  Abdominal:     General: Bowel sounds are normal.     Palpations: Abdomen is soft.  Musculoskeletal:        General: Tenderness present.     Cervical back: Neck supple.     Left hip: Tenderness present. No deformity or crepitus. Decreased range of motion. Normal strength.     Comments: Left groin and lateral hip tenderness  Skin:    General: Skin is  warm.     Capillary Refill: Capillary refill takes less than 2 seconds.  Neurological:     General: No focal deficit present.     Mental Status: She is alert. Mental status is at baseline.     Gait: Gait abnormal.  Psychiatric:        Mood and Affect: Mood normal.     Labs reviewed: Recent Labs    08/09/23 0000 12/17/23 0000  NA 140 140  K 4.4 4.3  CL 107 108  CO2 25* 25*  BUN 34* 37*  CREATININE 1.4* 1.3*  CALCIUM 9.1 8.9   Recent Labs    08/09/23 0000 12/17/23 0000  AST 14 17  ALT 10 14  ALKPHOS 118 117  ALBUMIN 3.4* 3.2*   Recent Labs    08/09/23 0000 12/17/23 0000 01/17/24 0000  WBC 4.6 5.0 4.2  NEUTROABS  --  2,315.00 1,768.00  HGB 10.4* 9.9* 11.5*  HCT 31* 30* 35*  PLT 231 249 293   Lab Results  Component Value Date   TSH 4.09 10/21/2021   No results found for: HGBA1C Lab Results  Component Value Date   CHOL 156 08/27/2020   HDL 69 08/27/2020   LDLCALC 75 08/27/2020   TRIG 62 08/27/2020   CHOLHDL 2.8 02/15/2010    Significant Diagnostic Results in last 30 days:  No results found.  Assessment/Plan 1. Left hip pain (Primary) - ongoing - trouble with transfers - left groin and lateral hip tenderness - suspect OA - do not recommend NSAIDS due to torsemide use - unsuccessful trials of voltaren gel and lidocaine  patches in past - past h/o weight gain/swelling/drowsiness/confusion with gabapentin - 06/21 prednisone  taper helped only a few days - will increase tylenol  to 1000 mg po BID - will add tramadol 25 mg BID prn for breakthrough pain - traMADol (ULTRAM) 50 MG tablet; Take 0.5 tablets (25 mg total) by mouth 2 (two) times daily as needed for up to 14 days.  Dispense: 7 tablet; Refill: 0  2. Dementia without behavioral disturbance (HCC) - no behaviors - see above - requiring more assistance with ADLs> may need SNF transfer in future -  weight stable - cont Namenda     Family/ staff Communication: plan discussed with patient and  nurse  Labs/tests ordered:  none

## 2024-04-21 ENCOUNTER — Other Ambulatory Visit: Payer: Self-pay | Admitting: Orthopedic Surgery

## 2024-04-21 ENCOUNTER — Encounter: Payer: Self-pay | Admitting: Orthopedic Surgery

## 2024-04-21 ENCOUNTER — Non-Acute Institutional Stay (SKILLED_NURSING_FACILITY): Payer: Self-pay | Admitting: Orthopedic Surgery

## 2024-04-21 DIAGNOSIS — R2681 Unsteadiness on feet: Secondary | ICD-10-CM | POA: Diagnosis not present

## 2024-04-21 DIAGNOSIS — F0390 Unspecified dementia without behavioral disturbance: Secondary | ICD-10-CM

## 2024-04-21 DIAGNOSIS — M150 Primary generalized (osteo)arthritis: Secondary | ICD-10-CM

## 2024-04-21 DIAGNOSIS — Z9181 History of falling: Secondary | ICD-10-CM | POA: Diagnosis not present

## 2024-04-21 DIAGNOSIS — R41841 Cognitive communication deficit: Secondary | ICD-10-CM | POA: Diagnosis not present

## 2024-04-21 DIAGNOSIS — M6281 Muscle weakness (generalized): Secondary | ICD-10-CM | POA: Diagnosis not present

## 2024-04-21 MED ORDER — TRAMADOL HCL 50 MG PO TABS: 50.0000 mg | ORAL_TABLET | Freq: Two times a day (BID) | ORAL | 0 refills | Status: AC | PRN

## 2024-04-21 NOTE — Progress Notes (Signed)
 Location:  [facility] Room Number: 35/A Place of Service:  SNF [phone]) Provider:  Greig FORBES Cluster, NP   Charlanne Fredia CROME, MD  Patient Care Team: Charlanne Fredia CROME, MD as PCP - General (Internal Medicine) Pa, [facility] (Ophthalmology) Dermatology, [facility] (Dermatology)  Extended Emergency Contact Information Primary Emergency Contact: Jones,Beth Address: [address]          [place], [place] [address] [place] Home Phone: [phone] Mobile Phone: [phone] Relation: Daughter  Code Status:  DNR Goals of care: Advanced Directive information    03/12/2023   10:33 AM  Advanced Directives  Does Patient Have a Medical Advance Directive? Yes  Type of Estate agent of [place];Out of facility DNR (pink MOST or yellow form)  Does patient want to make changes to medical advance directive? No - Patient declined  Copy of Healthcare Power of Attorney in Chart? Yes - validated most recent copy scanned in chart (See row information)     Chief Complaint  Patient presents with   Acute Visit    Joint pain    HPI:  Pt is a 89 y.o. female seen today for acute visit due to joint pain.   She currently resides in the skilled nursing unit at [facility]. PMH: HTN, HLD, venous insufficiency, GERD, dementia, right hip surgery, and diverticulitis.   Recently moved to skilled nursing due to worsening physical decline associated with arthritis and dementia. Lower back, knees and hips most bothersome. She is receiving tylenol  1000 mg twice daily. Tramadol  25 mg was prescribed as needed but orders fell off. Nursing reports increased pain while adjusting in wheelchair. No recent falls or injury. Afebrile. Vitals stable.   Recent BIMS 6/15 (08/01). No behaviors since moving to SNF. Ambulates with wheelchair. Remains on Namenda .     Past Medical History:  Diagnosis Date   Anxiety    Cataract    Chest pain 02/16/2010   Myoview,  negative for pharmacologic-stress induced ischemia, EF 74   Complication of anesthesia 1954   hard to wake up   Diverticulosis    DUB (dysfunctional uterine bleeding)    Dysrhythmia    h/o PVCs- none recently- no meds   Fall    GERD (gastroesophageal reflux disease)    Hearing reduced    Hypertension    no meds currently   Loss of consciousness (HCC)    Memory loss    Osteoarthritis of left hip    Osteoporosis    Palpitation 02/02/2013   A CardioNet MCOT (mobile cardiac outpatient telemetry ): NSR with PACs; sinus tachycardia with ventricular trigeminy. Brief atrial run. Facet cardiac; ventricular trigeminy and accelerated idioventricular rhythm. No significant tachyarrhythmias.   Palpitations    Postmenopausal bleeding 11/01/2014   Unsteady gait    Varicose veins of both legs with pain    Past Surgical History:  Procedure Laterality Date   APPENDECTOMY     CATARACT EXTRACTION     both eyes   CESAREAN SECTION  1962, 1963, 8035,8030   x 4   COLONOSCOPY  01/14/2017   DIAGNOSTIC LAPAROSCOPY     ECTOPIC PREGNANCY SURGERY  1966   FRACTURE SURGERY     HIP SURGERY Right    HYSTEROSCOPY WITH D & C N/A 11/02/2014   Procedure: DILATATION AND CURETTAGE /HYSTEROSCOPY;  Surgeon: Ezzie Buba, MD;  Location: [facility];  Service: Gynecology;  Laterality: N/A;   Lower Extremity Venous Dopplers  01/2013   No thrombus or  thrombophlebitis. R Greater Saphenous Vein - enlarged without other insufficiency (6 mm); right and left Small Saphenous Vein: Patent no valve insufficiency.   TONSILLECTOMY     TONSILLECTOMY AND ADENOIDECTOMY     TRANSTHORACIC ECHOCARDIOGRAM  01/2013   Normal LV size and function. EF 55-60%. Grade 1 diastolic dysfunction. Elevated LV filling pressures. No regional W. May.    Allergies  Allergen Reactions   Evista [Raloxifene]    Fosamax [Alendronate]    Penicillins    Polyoxyethylene Lauryl Ether [Sorbitan]    Moxifloxacin Rash    Outpatient Encounter  Medications as of 04/21/2024  Medication Sig   acetaminophen  (TYLENOL ) 500 MG tablet Take 1,000 mg by mouth 2 (two) times daily.   acetaminophen  (TYLENOL ) 500 MG tablet Take 2 tablets (1,000 mg total) by mouth daily as needed.   Calcium Carbonate (CALCIUM 600 PO) Take 1 tablet by mouth daily.   Emollient (CERAVE) LOTN Apply 1 application. topically daily.   fluticasone (FLONASE) 50 MCG/ACT nasal spray Place 2 sprays into both nostrils daily.   magnesium hydroxide (MILK OF MAGNESIA) 400 MG/5ML suspension Take 30 mLs by mouth daily as needed for mild constipation.   memantine  (NAMENDA ) 10 MG tablet Take 10 mg by mouth 2 (two) times daily.   MULTIPLE VITAMIN PO Take by mouth daily.   potassium chloride SA (KLOR-CON M) 20 MEQ tablet Take 30 mEq by mouth once.   torsemide (DEMADEX) 20 MG tablet Take 40 mg by mouth daily.   traMADol  (ULTRAM ) 50 MG tablet Take 1 tablet (50 mg total) by mouth every 12 (twelve) hours as needed for up to 14 days.   No facility-administered encounter medications on file as of 04/21/2024.    Review of Systems  Unable to perform ROS: Dementia    Immunization History  Administered Date(s) Administered   Fluad Quad(high Dose 65+) 07/08/2022   Influenza, High Dose Seasonal PF 07/13/2023   Influenza, Quadrivalent, Recombinant, Inj, Pf 06/18/2018, 05/04/2019, 06/15/2020   Influenza-Unspecified 09/15/2019, 07/08/2021, 07/08/2023   Moderna Covid-19 Vaccine  Bivalent Booster 49yrs & up 07/21/2023   Moderna Sars-Covid-2 Vaccination 09/15/2019   PFIZER(Purple Top)SARS-COV-2 Vaccination 10/03/2019, 10/22/2019, 09/03/2020   PPD Test 10/09/2020   Pfizer Covid-19 Vaccine Bivalent Booster 30yrs & up 06/04/2021   Pneumococcal Conjugate-13 05/28/2014   Pneumococcal Polysaccharide-23 07/15/1998, 06/15/2005   RSV,unspecified 08/29/2022   Tdap 05/26/2016   Unspecified SARS-COV-2 Vaccination 03/03/2022   Zoster, Live 05/18/2013, 05/28/2014   Pertinent  Health Maintenance Due  Topic  Date Due   DEXA SCAN  Never done   INFLUENZA VACCINE  04/14/2024      05/22/2022    2:37 PM 07/23/2022    2:47 PM 05/28/2023   12:41 PM 12/24/2023    3:54 PM 02/25/2024    2:24 PM  Fall Risk  Falls in the past year? 0 0 1 1 1   Was there an injury with Fall? 0 0 1 0 0  Fall Risk Category Calculator 0 0 3 1 1   Fall Risk Category (Retired) Low  Low      (RETIRED) Patient Fall Risk Level Low fall risk  Low fall risk      Patient at Risk for Falls Due to History of fall(s) No Fall Risks History of fall(s);Impaired balance/gait;Impaired mobility History of fall(s);Impaired balance/gait History of fall(s);Impaired balance/gait  Fall risk Follow up Falls evaluation completed;Education provided;Falls prevention discussed  Falls evaluation completed  Falls evaluation completed;Education provided;Falls prevention discussed Falls evaluation completed Falls evaluation completed;Education provided     Data saved  with a previous flowsheet row definition   Functional Status Survey:    Vitals:   04/21/24 1403  BP: 129/67  Pulse: 64  Resp: 16  Temp: (!) 97 F (36.1 C)  SpO2: 98%  Weight: 150 lb 3.2 oz (68.1 kg)  Height: 5' 5 (1.651 m)   Body mass index is 24.99 kg/m. Physical Exam Vitals reviewed.  Constitutional:      General: She is not in acute distress. HENT:     Head: Normocephalic.  Eyes:     General:        Right eye: No discharge.        Left eye: No discharge.  Cardiovascular:     Rate and Rhythm: Normal rate and regular rhythm.     Pulses: Normal pulses.     Heart sounds: Normal heart sounds.  Pulmonary:     Effort: Pulmonary effort is normal.     Breath sounds: Normal breath sounds.  Abdominal:     General: Bowel sounds are normal.     Palpations: Abdomen is soft.  Musculoskeletal:     Cervical back: Neck supple.     Right lower leg: Edema present.     Left lower leg: Edema present.     Comments: Non pitting  Skin:    General: Skin is warm.     Capillary Refill:  Capillary refill takes less than 2 seconds.  Neurological:     General: No focal deficit present.     Mental Status: She is alert.     Motor: Weakness present.     Gait: Gait abnormal.  Psychiatric:        Mood and Affect: Mood normal.     Comments: Alert to self/person, follows commands     Labs reviewed: Recent Labs    08/09/23 0000 12/17/23 0000  NA 140 140  K 4.4 4.3  CL 107 108  CO2 25* 25*  BUN 34* 37*  CREATININE 1.4* 1.3*  CALCIUM 9.1 8.9   Recent Labs    08/09/23 0000 12/17/23 0000  AST 14 17  ALT 10 14  ALKPHOS 118 117  ALBUMIN 3.4* 3.2*   Recent Labs    08/09/23 0000 12/17/23 0000 01/17/24 0000  WBC 4.6 5.0 4.2  NEUTROABS  --  2,315.00 1,768.00  HGB 10.4* 9.9* 11.5*  HCT 31* 30* 35*  PLT 231 249 293   Lab Results  Component Value Date   TSH 4.09 10/21/2021   No results found for: HGBA1C Lab Results  Component Value Date   CHOL 156 08/27/2020   HDL 69 08/27/2020   LDLCALC 75 08/27/2020   TRIG 62 08/27/2020   CHOLHDL 2.8 02/15/2010    Significant Diagnostic Results in last 30 days:  No results found.  Assessment/Plan: 1. Primary osteoarthritis involving multiple joints (Primary) - lower back, bilateral knees and hip most bothersome - cont tylenol  1000 mg po BID - start Tramadol  50 mg po BID PRN x 14 days> revaluate usage in 14 days  2. Dementia without behavioral disturbance (HCC) - recent BIMS 6/15 04/14/2024 - MMSE 23/30 10/19/2023 - no behaviors - adjusting well in SNF - dependent with ADLs except feeding - ambulates with w/c - cont Namenda     Family/ staff Communication: plan discussed with patient and nurse  Labs/tests ordered:  none

## 2024-04-24 DIAGNOSIS — Z9181 History of falling: Secondary | ICD-10-CM | POA: Diagnosis not present

## 2024-04-24 DIAGNOSIS — R2681 Unsteadiness on feet: Secondary | ICD-10-CM | POA: Diagnosis not present

## 2024-04-24 DIAGNOSIS — R41841 Cognitive communication deficit: Secondary | ICD-10-CM | POA: Diagnosis not present

## 2024-04-24 DIAGNOSIS — M6281 Muscle weakness (generalized): Secondary | ICD-10-CM | POA: Diagnosis not present

## 2024-04-25 DIAGNOSIS — R2681 Unsteadiness on feet: Secondary | ICD-10-CM | POA: Diagnosis not present

## 2024-04-25 DIAGNOSIS — R41841 Cognitive communication deficit: Secondary | ICD-10-CM | POA: Diagnosis not present

## 2024-04-25 DIAGNOSIS — M6281 Muscle weakness (generalized): Secondary | ICD-10-CM | POA: Diagnosis not present

## 2024-04-25 DIAGNOSIS — Z9181 History of falling: Secondary | ICD-10-CM | POA: Diagnosis not present

## 2024-04-26 DIAGNOSIS — R2681 Unsteadiness on feet: Secondary | ICD-10-CM | POA: Diagnosis not present

## 2024-04-26 DIAGNOSIS — R41841 Cognitive communication deficit: Secondary | ICD-10-CM | POA: Diagnosis not present

## 2024-04-26 DIAGNOSIS — Z9181 History of falling: Secondary | ICD-10-CM | POA: Diagnosis not present

## 2024-04-26 DIAGNOSIS — M6281 Muscle weakness (generalized): Secondary | ICD-10-CM | POA: Diagnosis not present

## 2024-04-27 ENCOUNTER — Non-Acute Institutional Stay (SKILLED_NURSING_FACILITY): Payer: Self-pay | Admitting: Internal Medicine

## 2024-04-27 ENCOUNTER — Encounter: Payer: Self-pay | Admitting: Internal Medicine

## 2024-04-27 DIAGNOSIS — D631 Anemia in chronic kidney disease: Secondary | ICD-10-CM

## 2024-04-27 DIAGNOSIS — G309 Alzheimer's disease, unspecified: Secondary | ICD-10-CM

## 2024-04-27 DIAGNOSIS — R600 Localized edema: Secondary | ICD-10-CM

## 2024-04-27 DIAGNOSIS — N1832 Chronic kidney disease, stage 3b: Secondary | ICD-10-CM

## 2024-04-27 DIAGNOSIS — F0280 Dementia in other diseases classified elsewhere without behavioral disturbance: Secondary | ICD-10-CM | POA: Diagnosis not present

## 2024-04-27 DIAGNOSIS — M150 Primary generalized (osteo)arthritis: Secondary | ICD-10-CM

## 2024-04-27 DIAGNOSIS — F0150 Vascular dementia without behavioral disturbance: Secondary | ICD-10-CM | POA: Diagnosis not present

## 2024-04-27 NOTE — Progress Notes (Unsigned)
 Provider:    Charlanne Fredia CROME, MD  Location:  [facility] Room Number: N35-A Place of Service:  SNF ([phone])  PCP: Charlanne Fredia CROME, MD Patient Care Team: Charlanne Fredia CROME, MD as PCP - General (Internal Medicine) Pa, [facility] (Ophthalmology) Dermatology, [facility] (Dermatology)  Extended Emergency Contact Information Primary Emergency Contact: Jones,Beth Address: [address]          [place], [place] [address] [place] Home Phone: [phone] Mobile Phone: [phone] Relation: Daughter  Code Status: DNR Goals of Care: Advanced Directive information    03/12/2023   10:33 AM  Advanced Directives  Does Patient Have a Medical Advance Directive? Yes  Type of Estate agent of [place];Out of facility DNR (pink MOST or yellow form)  Does patient want to make changes to medical advance directive? No - Patient declined  Copy of Healthcare Power of Attorney in Chart? Yes - validated most recent copy scanned in chart (See row information)      Chief Complaint  Patient presents with   New Admit To SNF    HPI: Patient is a 89 y.o. female seen today for admission to  Past Medical History:  Diagnosis Date   Anxiety    Cataract    Chest pain 02/16/2010   Myoview, negative for pharmacologic-stress induced ischemia, EF 74   Complication of anesthesia 1954   hard to wake up   Diverticulosis    DUB (dysfunctional uterine bleeding)    Dysrhythmia    h/o PVCs- none recently- no meds   Fall    GERD (gastroesophageal reflux disease)    Hearing reduced    Hypertension    no meds currently   Loss of consciousness (HCC)    Memory loss    Osteoarthritis of left hip    Osteoporosis    Palpitation 02/02/2013   A CardioNet MCOT (mobile cardiac outpatient telemetry ): NSR with PACs; sinus tachycardia with ventricular trigeminy. Brief atrial run. Facet cardiac; ventricular trigeminy and accelerated idioventricular rhythm. No  significant tachyarrhythmias.   Palpitations    Postmenopausal bleeding 11/01/2014   Unsteady gait    Varicose veins of both legs with pain    Past Surgical History:  Procedure Laterality Date   APPENDECTOMY     CATARACT EXTRACTION     both eyes   CESAREAN SECTION  1962, 1963, 8035,8030   x 4   COLONOSCOPY  01/14/2017   DIAGNOSTIC LAPAROSCOPY     ECTOPIC PREGNANCY SURGERY  1966   FRACTURE SURGERY     HIP SURGERY Right    HYSTEROSCOPY WITH D & C N/A 11/02/2014   Procedure: DILATATION AND CURETTAGE /HYSTEROSCOPY;  Surgeon: Ezzie Buba, MD;  Location: [facility];  Service: Gynecology;  Laterality: N/A;   Lower Extremity Venous Dopplers  01/2013   No thrombus or thrombophlebitis. R Greater Saphenous Vein - enlarged without other insufficiency (6 mm); right and left Small Saphenous Vein: Patent no valve insufficiency.   TONSILLECTOMY     TONSILLECTOMY AND ADENOIDECTOMY     TRANSTHORACIC ECHOCARDIOGRAM  01/2013   Normal LV size and function. EF 55-60%. Grade 1 diastolic dysfunction. Elevated LV filling pressures. No regional W. May.    reports that she has never smoked. She has never used smokeless tobacco. She reports current alcohol use of about 7.0 standard drinks of alcohol per week. She reports that she does not use drugs. Social History   Socioeconomic History   Marital status: Widowed  Spouse name: Not on file   Number of children: Not on file   Years of education: Not on file   Highest education level: Not on file  Occupational History   Not on file  Tobacco Use   Smoking status: Never   Smokeless tobacco: Never  Vaping Use   Vaping status: Never Used  Substance and Sexual Activity   Alcohol use: Yes    Alcohol/week: 7.0 standard drinks of alcohol    Types: 7 Standard drinks or equivalent per week    Comment: occasionally   Drug use: No   Sexual activity: Not on file  Other Topics Concern   Not on file  Social History Narrative   Tobaccco-None Never a  tobacco user.   Alcohol use <1 drink per week.    Regular diet.    Patient does consume caffeine-Coffee and tea.   Marital status-Widow, married since 79.   Patient lives alone. No pets.    Past profession housewife, Lexicographer.   No exercise.    No DNR form and does not want to discuss one.    Patient has a HPOA          Social Drivers of Corporate investment banker Strain: Low Risk  (05/28/2023)   Overall Financial Resource Strain (CARDIA)    Difficulty of Paying Living Expenses: Not hard at all  Food Insecurity: No Food Insecurity (05/28/2023)   Hunger Vital Sign    Worried About Running Out of Food in the Last Year: Never true    Ran Out of Food in the Last Year: Never true  Transportation Needs: No Transportation Needs (05/28/2023)   PRAPARE - Administrator, Civil Service (Medical): No    Lack of Transportation (Non-Medical): No  Physical Activity: Inactive (05/28/2023)   Exercise Vital Sign    Days of Exercise per Week: 0 days    Minutes of Exercise per Session: 0 min  Stress: No Stress Concern Present (05/28/2023)   [employer] of Occupational Health - Occupational Stress Questionnaire    Feeling of Stress : Not at all  Social Connections: Unknown (05/28/2023)   Social Connection and Isolation Panel    Frequency of Communication with Friends and Family: Patient unable to answer    Frequency of Social Gatherings with Friends and Family: Patient unable to answer    Attends Religious Services: Patient unable to answer    Active Member of Clubs or Organizations: Patient unable to answer    Attends Banker Meetings: Patient unable to answer    Marital Status: Widowed  Intimate Partner Violence: Not At Risk (05/28/2023)   Humiliation, Afraid, Rape, and Kick questionnaire    Fear of Current or Ex-Partner: No    Emotionally Abused: No    Physically Abused: No    Sexually Abused: No    Functional Status Survey:    Family History   Problem Relation Age of Onset   Stroke Mother 57   Hypertension Mother    Colon cancer Mother 58   Hypertension Father    Stroke Father        2   Dementia Father    Dementia Sister    Hypertension Son 30       Started on BP medicine age 23/24    Health Maintenance  Topic Date Due   Zoster Vaccines- Shingrix (1 of 2) 03/07/1983   INFLUENZA VACCINE  04/14/2024   Medicare Annual Wellness (AWV)  05/27/2024  COVID-19 Vaccine (9 - 2024-25 season) 07/06/2024   DTaP/Tdap/Td (2 - Td or Tdap) 05/26/2026   Pneumococcal Vaccine: 50+ Years  Completed   HPV VACCINES  Aged Out   Meningococcal B Vaccine  Aged Out   DEXA SCAN  Discontinued    Allergies  Allergen Reactions   Evista [Raloxifene]    Fosamax [Alendronate]    Penicillins    Polyoxyethylene Lauryl Ether [Sorbitan]    Moxifloxacin Rash    Outpatient Encounter Medications as of 04/27/2024  Medication Sig   acetaminophen  (TYLENOL ) 500 MG tablet Take 1,000 mg by mouth 2 (two) times daily.   Calcium Carbonate (CALCIUM 600 PO) Take 1 tablet by mouth daily.   Emollient (CERAVE) LOTN Apply 1 application. topically daily.   fluticasone (FLONASE) 50 MCG/ACT nasal spray Place 1 spray into both nostrils daily.   memantine  (NAMENDA ) 10 MG tablet Take 10 mg by mouth 2 (two) times daily.   MULTIPLE VITAMIN PO Take by mouth daily.   potassium chloride SA (KLOR-CON M) 20 MEQ tablet Take 30 mEq by mouth once.   torsemide (DEMADEX) 20 MG tablet Take 40 mg by mouth daily.   acetaminophen  (TYLENOL ) 500 MG tablet Take 2 tablets (1,000 mg total) by mouth daily as needed.   magnesium hydroxide (MILK OF MAGNESIA) 400 MG/5ML suspension Take 30 mLs by mouth daily as needed for mild constipation.   traMADol  (ULTRAM ) 50 MG tablet Take 1 tablet (50 mg total) by mouth every 12 (twelve) hours as needed for up to 14 days.   Zinc Oxide 10 % OINT Apply topically daily as needed.   No facility-administered encounter medications on file as of 04/27/2024.     Review of Systems  Vitals:   04/27/24 1111  BP: 129/68  Pulse: 78  Temp: (!) 97.5 F (36.4 C)  SpO2: 98%  Weight: 150 lb 3.2 oz (68.1 kg)  Height: 5' 5 (1.651 m)   Body mass index is 24.99 kg/m. Physical Exam  Labs reviewed: Basic Metabolic Panel: Recent Labs    08/09/23 0000 12/17/23 0000  NA 140 140  K 4.4 4.3  CL 107 108  CO2 25* 25*  BUN 34* 37*  CREATININE 1.4* 1.3*  CALCIUM 9.1 8.9   Liver Function Tests: Recent Labs    08/09/23 0000 12/17/23 0000  AST 14 17  ALT 10 14  ALKPHOS 118 117  ALBUMIN 3.4* 3.2*   No results for input(s): LIPASE, AMYLASE in the last 8760 hours. No results for input(s): AMMONIA in the last 8760 hours. CBC: Recent Labs    08/09/23 0000 12/17/23 0000 01/17/24 0000  WBC 4.6 5.0 4.2  NEUTROABS  --  2,315.00 1,768.00  HGB 10.4* 9.9* 11.5*  HCT 31* 30* 35*  PLT 231 249 293   Cardiac Enzymes: No results for input(s): CKTOTAL, CKMB, CKMBINDEX, TROPONINI in the last 8760 hours. BNP: Invalid input(s): POCBNP No results found for: HGBA1C Lab Results  Component Value Date   TSH 4.09 10/21/2021   Lab Results  Component Value Date   VITAMINB12 729 09/21/2019   No results found for: FOLATE No results found for: IRON, TIBC, FERRITIN  Imaging and Procedures obtained prior to SNF admission: MR BRAIN W WO CONTRAST Result Date: 11/14/2019 CLINICAL DATA:  Initial evaluation for neuro cognitive decline, history of subacute head trauma. EXAM: MRI HEAD WITHOUT AND WITH CONTRAST TECHNIQUE: Multiplanar, multiecho pulse sequences of the brain and surrounding structures were obtained without and with intravenous contrast. CONTRAST:  16mL MULTIHANCE  GADOBENATE DIMEGLUMINE  529  MG/ML IV SOLN COMPARISON:  None available. FINDINGS: Brain: Diffuse prominence of the CSF containing spaces compatible with generalized age-related cerebral atrophy. Patchy T2/FLAIR hyperintensity within the periventricular deep white  matter both cerebral hemispheres most consistent with chronic small vessel ischemic disease, mild for age. No abnormal foci of restricted diffusion to suggest acute or subacute ischemia. Gray-white matter differentiation maintained. No encephalomalacia to suggest chronic cortical infarction. No foci of susceptibility artifact to suggest acute or chronic intracranial hemorrhage. No mass lesion, midline shift or mass effect. Ventricles normal size without hydrocephalus. No extra-axial fluid collection. Pituitary gland suprasellar region normal. Midline structures intact. No abnormal enhancement following contrast administration. Vascular: Major intracranial vascular flow voids are maintained. Skull and upper cervical spine: Craniocervical junction within normal limits. Visualized upper cervical spine normal. Bone marrow signal intensity within normal limits. No scalp soft tissue abnormality. Sinuses/Orbits: Patient status post bilateral ocular lens replacement. Globes and orbital soft tissues demonstrate no acute finding. Scattered mucosal thickening noted within the ethmoidal air cells and maxillary sinuses. Paranasal sinuses are otherwise clear. Trace bilateral mastoid effusions, of doubtful significance. Inner ear structures grossly normal. Other: None. IMPRESSION: 1. No acute intracranial abnormality. 2. Mild age-related cerebral atrophy with chronic small vessel ischemic disease. Otherwise unremarkable brain MRI for age. Electronically Signed   By: Morene Hoard M.D.   On: 11/14/2019 04:19    Assessment/Plan There are no diagnoses linked to this encounter.   Family/ staff Communication:   Labs/tests ordered:

## 2024-04-28 DIAGNOSIS — M6281 Muscle weakness (generalized): Secondary | ICD-10-CM | POA: Diagnosis not present

## 2024-04-28 DIAGNOSIS — Z9181 History of falling: Secondary | ICD-10-CM | POA: Diagnosis not present

## 2024-04-28 DIAGNOSIS — R2681 Unsteadiness on feet: Secondary | ICD-10-CM | POA: Diagnosis not present

## 2024-04-28 DIAGNOSIS — R41841 Cognitive communication deficit: Secondary | ICD-10-CM | POA: Diagnosis not present

## 2024-05-01 DIAGNOSIS — Z9181 History of falling: Secondary | ICD-10-CM | POA: Diagnosis not present

## 2024-05-01 DIAGNOSIS — M6281 Muscle weakness (generalized): Secondary | ICD-10-CM | POA: Diagnosis not present

## 2024-05-01 DIAGNOSIS — R2681 Unsteadiness on feet: Secondary | ICD-10-CM | POA: Diagnosis not present

## 2024-05-01 DIAGNOSIS — R41841 Cognitive communication deficit: Secondary | ICD-10-CM | POA: Diagnosis not present

## 2024-05-03 DIAGNOSIS — Z9181 History of falling: Secondary | ICD-10-CM | POA: Diagnosis not present

## 2024-05-03 DIAGNOSIS — R2681 Unsteadiness on feet: Secondary | ICD-10-CM | POA: Diagnosis not present

## 2024-05-03 DIAGNOSIS — R41841 Cognitive communication deficit: Secondary | ICD-10-CM | POA: Diagnosis not present

## 2024-05-03 DIAGNOSIS — M6281 Muscle weakness (generalized): Secondary | ICD-10-CM | POA: Diagnosis not present

## 2024-05-04 DIAGNOSIS — R2681 Unsteadiness on feet: Secondary | ICD-10-CM | POA: Diagnosis not present

## 2024-05-04 DIAGNOSIS — R41841 Cognitive communication deficit: Secondary | ICD-10-CM | POA: Diagnosis not present

## 2024-05-04 DIAGNOSIS — M6281 Muscle weakness (generalized): Secondary | ICD-10-CM | POA: Diagnosis not present

## 2024-05-04 DIAGNOSIS — Z9181 History of falling: Secondary | ICD-10-CM | POA: Diagnosis not present

## 2024-05-05 DIAGNOSIS — R41841 Cognitive communication deficit: Secondary | ICD-10-CM | POA: Diagnosis not present

## 2024-05-05 DIAGNOSIS — Z9181 History of falling: Secondary | ICD-10-CM | POA: Diagnosis not present

## 2024-05-05 DIAGNOSIS — R2681 Unsteadiness on feet: Secondary | ICD-10-CM | POA: Diagnosis not present

## 2024-05-05 DIAGNOSIS — M6281 Muscle weakness (generalized): Secondary | ICD-10-CM | POA: Diagnosis not present

## 2024-05-08 DIAGNOSIS — R41841 Cognitive communication deficit: Secondary | ICD-10-CM | POA: Diagnosis not present

## 2024-05-08 DIAGNOSIS — M6281 Muscle weakness (generalized): Secondary | ICD-10-CM | POA: Diagnosis not present

## 2024-05-08 DIAGNOSIS — R2681 Unsteadiness on feet: Secondary | ICD-10-CM | POA: Diagnosis not present

## 2024-05-08 DIAGNOSIS — Z9181 History of falling: Secondary | ICD-10-CM | POA: Diagnosis not present

## 2024-05-09 DIAGNOSIS — Z9181 History of falling: Secondary | ICD-10-CM | POA: Diagnosis not present

## 2024-05-09 DIAGNOSIS — M6281 Muscle weakness (generalized): Secondary | ICD-10-CM | POA: Diagnosis not present

## 2024-05-09 DIAGNOSIS — R41841 Cognitive communication deficit: Secondary | ICD-10-CM | POA: Diagnosis not present

## 2024-05-09 DIAGNOSIS — R2681 Unsteadiness on feet: Secondary | ICD-10-CM | POA: Diagnosis not present

## 2024-05-11 DIAGNOSIS — R2681 Unsteadiness on feet: Secondary | ICD-10-CM | POA: Diagnosis not present

## 2024-05-11 DIAGNOSIS — M6281 Muscle weakness (generalized): Secondary | ICD-10-CM | POA: Diagnosis not present

## 2024-05-11 DIAGNOSIS — R41841 Cognitive communication deficit: Secondary | ICD-10-CM | POA: Diagnosis not present

## 2024-05-11 DIAGNOSIS — Z9181 History of falling: Secondary | ICD-10-CM | POA: Diagnosis not present

## 2024-05-12 DIAGNOSIS — R41841 Cognitive communication deficit: Secondary | ICD-10-CM | POA: Diagnosis not present

## 2024-05-12 DIAGNOSIS — R2681 Unsteadiness on feet: Secondary | ICD-10-CM | POA: Diagnosis not present

## 2024-05-12 DIAGNOSIS — Z9181 History of falling: Secondary | ICD-10-CM | POA: Diagnosis not present

## 2024-05-12 DIAGNOSIS — M6281 Muscle weakness (generalized): Secondary | ICD-10-CM | POA: Diagnosis not present

## 2024-05-16 DIAGNOSIS — M25561 Pain in right knee: Secondary | ICD-10-CM | POA: Diagnosis not present

## 2024-05-16 DIAGNOSIS — Z9181 History of falling: Secondary | ICD-10-CM | POA: Diagnosis not present

## 2024-05-16 DIAGNOSIS — R41841 Cognitive communication deficit: Secondary | ICD-10-CM | POA: Diagnosis not present

## 2024-05-16 DIAGNOSIS — R278 Other lack of coordination: Secondary | ICD-10-CM | POA: Diagnosis not present

## 2024-05-16 DIAGNOSIS — M25551 Pain in right hip: Secondary | ICD-10-CM | POA: Diagnosis not present

## 2024-05-16 DIAGNOSIS — M25552 Pain in left hip: Secondary | ICD-10-CM | POA: Diagnosis not present

## 2024-05-16 DIAGNOSIS — M25562 Pain in left knee: Secondary | ICD-10-CM | POA: Diagnosis not present

## 2024-05-16 DIAGNOSIS — M6281 Muscle weakness (generalized): Secondary | ICD-10-CM | POA: Diagnosis not present

## 2024-05-16 DIAGNOSIS — R2681 Unsteadiness on feet: Secondary | ICD-10-CM | POA: Diagnosis not present

## 2024-05-18 DIAGNOSIS — R278 Other lack of coordination: Secondary | ICD-10-CM | POA: Diagnosis not present

## 2024-05-18 DIAGNOSIS — M6281 Muscle weakness (generalized): Secondary | ICD-10-CM | POA: Diagnosis not present

## 2024-05-18 DIAGNOSIS — Z9181 History of falling: Secondary | ICD-10-CM | POA: Diagnosis not present

## 2024-05-18 DIAGNOSIS — R41841 Cognitive communication deficit: Secondary | ICD-10-CM | POA: Diagnosis not present

## 2024-05-18 DIAGNOSIS — R2681 Unsteadiness on feet: Secondary | ICD-10-CM | POA: Diagnosis not present

## 2024-05-18 DIAGNOSIS — M25551 Pain in right hip: Secondary | ICD-10-CM | POA: Diagnosis not present

## 2024-05-19 ENCOUNTER — Other Ambulatory Visit: Payer: Self-pay | Admitting: Orthopedic Surgery

## 2024-05-19 DIAGNOSIS — R278 Other lack of coordination: Secondary | ICD-10-CM | POA: Diagnosis not present

## 2024-05-19 DIAGNOSIS — Z9181 History of falling: Secondary | ICD-10-CM | POA: Diagnosis not present

## 2024-05-19 DIAGNOSIS — M6281 Muscle weakness (generalized): Secondary | ICD-10-CM | POA: Diagnosis not present

## 2024-05-19 DIAGNOSIS — R2681 Unsteadiness on feet: Secondary | ICD-10-CM | POA: Diagnosis not present

## 2024-05-19 DIAGNOSIS — R41841 Cognitive communication deficit: Secondary | ICD-10-CM | POA: Diagnosis not present

## 2024-05-19 DIAGNOSIS — M25551 Pain in right hip: Secondary | ICD-10-CM | POA: Diagnosis not present

## 2024-05-19 DIAGNOSIS — M150 Primary generalized (osteo)arthritis: Secondary | ICD-10-CM

## 2024-05-19 DIAGNOSIS — M25552 Pain in left hip: Secondary | ICD-10-CM

## 2024-05-19 MED ORDER — ACETAMINOPHEN 500 MG PO TABS: 1000.0000 mg | ORAL_TABLET | Freq: Three times a day (TID) | ORAL | Status: AC | PRN

## 2024-05-19 MED ORDER — TRAMADOL HCL 50 MG PO TABS: 50.0000 mg | ORAL_TABLET | Freq: Two times a day (BID) | ORAL | 2 refills | Status: AC

## 2024-05-23 DIAGNOSIS — M25551 Pain in right hip: Secondary | ICD-10-CM | POA: Diagnosis not present

## 2024-05-23 DIAGNOSIS — R41841 Cognitive communication deficit: Secondary | ICD-10-CM | POA: Diagnosis not present

## 2024-05-23 DIAGNOSIS — R278 Other lack of coordination: Secondary | ICD-10-CM | POA: Diagnosis not present

## 2024-05-23 DIAGNOSIS — R2681 Unsteadiness on feet: Secondary | ICD-10-CM | POA: Diagnosis not present

## 2024-05-23 DIAGNOSIS — M6281 Muscle weakness (generalized): Secondary | ICD-10-CM | POA: Diagnosis not present

## 2024-05-23 DIAGNOSIS — Z9181 History of falling: Secondary | ICD-10-CM | POA: Diagnosis not present

## 2024-05-24 ENCOUNTER — Non-Acute Institutional Stay (SKILLED_NURSING_FACILITY): Payer: Self-pay | Admitting: Orthopedic Surgery

## 2024-05-24 ENCOUNTER — Encounter: Payer: Self-pay | Admitting: Orthopedic Surgery

## 2024-05-24 DIAGNOSIS — F0280 Dementia in other diseases classified elsewhere without behavioral disturbance: Secondary | ICD-10-CM

## 2024-05-24 DIAGNOSIS — M150 Primary generalized (osteo)arthritis: Secondary | ICD-10-CM | POA: Diagnosis not present

## 2024-05-24 DIAGNOSIS — M810 Age-related osteoporosis without current pathological fracture: Secondary | ICD-10-CM | POA: Diagnosis not present

## 2024-05-24 DIAGNOSIS — R600 Localized edema: Secondary | ICD-10-CM | POA: Diagnosis not present

## 2024-05-24 DIAGNOSIS — F0150 Vascular dementia without behavioral disturbance: Secondary | ICD-10-CM

## 2024-05-24 DIAGNOSIS — N1832 Chronic kidney disease, stage 3b: Secondary | ICD-10-CM | POA: Diagnosis not present

## 2024-05-24 DIAGNOSIS — G309 Alzheimer's disease, unspecified: Secondary | ICD-10-CM | POA: Diagnosis not present

## 2024-05-24 NOTE — Progress Notes (Signed)
 Location:  [facility] Room Number: 35/A Place of Service:  SNF (31) Provider:Jamiyla Ishee FORBES Cluster, NP   Charlanne Fredia CROME, MD  Patient Care Team: Charlanne Fredia CROME, MD as PCP - General (Internal Medicine) Pa, [facility] (Ophthalmology) Dermatology, [facility] (Dermatology)  Extended Emergency Contact Information Primary Emergency Contact: Jones,Beth Address: [address]          [place], [place] [address] [place] Home Phone: [phone] Mobile Phone: [phone] Relation: Daughter  Code Status:  DNR Goals of care: Advanced Directive information    03/12/2023   10:33 AM  Advanced Directives  Does Patient Have a Medical Advance Directive? Yes  Type of Estate agent of [place];Out of facility DNR (pink MOST or yellow form)  Does patient want to make changes to medical advance directive? No - Patient declined  Copy of Healthcare Power of Attorney in Chart? Yes - validated most recent copy scanned in chart (See row information)     Chief Complaint  Patient presents with   Medical Management of Chronic Issues    HPI:  Pt is a 89 y.o. female seen today for medical management of chronic diseases.    She currently resides in the skilled nursing unit at [facility]. PMH: HTN, HLD, venous insufficiency, GERD, dementia, right hip surgery, and diverticulitis.   OA/ chronic back pain- back, knees and hips most bothersome, now on scheduled tramadol  Bilateral leg edema-  BUN/creat 37/1.3 12/17/2023, remains on torsemide and KCL AD/Vascular dementia- MMSE 23/30 10/19/2023, was 24/30 (04/07/2023)> was 22/30 2022, recent BIMS 5/15 (08/13), MRI brain 2022 indicated mild age related cerebral atrophy with chronic small vessel disease, no behaviors, dependent with ADLs except feeding, remains on Namenda   CKD- see above, GFR 35 08/09/2023 Osteoporosis- DEXA 1999, remains on calcium  No recent falls or injuries.   Recent blood  pressures:  09/09- 125/68  09/02- 111/68  08/26- 111/63   Recent weights:  09/10- 148.6 lbs  09/01- 150 lbs  08/01- 150.2 lbs  07/01- 147 lbs       Past Medical History:  Diagnosis Date   Anxiety    Cataract    Chest pain 02/16/2010   Myoview, negative for pharmacologic-stress induced ischemia, EF 74   Complication of anesthesia 1954   hard to wake up   Diverticulosis    DUB (dysfunctional uterine bleeding)    Dysrhythmia    h/o PVCs- none recently- no meds   Fall    GERD (gastroesophageal reflux disease)    Hearing reduced    Hypertension    no meds currently   Loss of consciousness (HCC)    Memory loss    Osteoarthritis of left hip    Osteoporosis    Palpitation 02/02/2013   A CardioNet MCOT (mobile cardiac outpatient telemetry ): NSR with PACs; sinus tachycardia with ventricular trigeminy. Brief atrial run. Facet cardiac; ventricular trigeminy and accelerated idioventricular rhythm. No significant tachyarrhythmias.   Palpitations    Postmenopausal bleeding 11/01/2014   Unsteady gait    Varicose veins of both legs with pain    Past Surgical History:  Procedure Laterality Date   APPENDECTOMY     CATARACT EXTRACTION     both eyes   CESAREAN SECTION  1962, 1963, 8035,8030   x 4   COLONOSCOPY  01/14/2017   DIAGNOSTIC LAPAROSCOPY     ECTOPIC PREGNANCY SURGERY  1966   FRACTURE SURGERY     HIP SURGERY Right    HYSTEROSCOPY  WITH D & C N/A 11/02/2014   Procedure: DILATATION AND CURETTAGE /HYSTEROSCOPY;  Surgeon: Ezzie Buba, MD;  Location: [facility];  Service: Gynecology;  Laterality: N/A;   Lower Extremity Venous Dopplers  01/2013   No thrombus or thrombophlebitis. R Greater Saphenous Vein - enlarged without other insufficiency (6 mm); right and left Small Saphenous Vein: Patent no valve insufficiency.   TONSILLECTOMY     TONSILLECTOMY AND ADENOIDECTOMY     TRANSTHORACIC ECHOCARDIOGRAM  01/2013   Normal LV size and function. EF 55-60%. Grade 1 diastolic  dysfunction. Elevated LV filling pressures. No regional W. May.    Allergies  Allergen Reactions   Evista [Raloxifene]    Fosamax [Alendronate]    Penicillins    Polyoxyethylene Lauryl Ether [Sorbitan]    Moxifloxacin Rash    Outpatient Encounter Medications as of 05/24/2024  Medication Sig   acetaminophen  (TYLENOL ) 500 MG tablet Take 2 tablets (1,000 mg total) by mouth 3 (three) times daily as needed.   Calcium Carbonate (CALCIUM 600 PO) Take 1 tablet by mouth daily.   Emollient (CERAVE) LOTN Apply 1 application. topically daily.   fluticasone (FLONASE) 50 MCG/ACT nasal spray Place 1 spray into both nostrils daily.   magnesium hydroxide (MILK OF MAGNESIA) 400 MG/5ML suspension Take 30 mLs by mouth daily as needed for mild constipation.   memantine  (NAMENDA ) 10 MG tablet Take 10 mg by mouth 2 (two) times daily.   MULTIPLE VITAMIN PO Take by mouth daily.   potassium chloride SA (KLOR-CON M) 20 MEQ tablet Take 30 mEq by mouth once.   torsemide (DEMADEX) 20 MG tablet Take 40 mg by mouth daily.   traMADol  (ULTRAM ) 50 MG tablet Take 1 tablet (50 mg total) by mouth 2 (two) times daily for 5 days.   Zinc Oxide 10 % OINT Apply topically daily as needed.   No facility-administered encounter medications on file as of 05/24/2024.    Review of Systems  Unable to perform ROS: Dementia    Immunization History  Administered Date(s) Administered   Fluad Quad(high Dose 65+) 07/08/2022   INFLUENZA, HIGH DOSE SEASONAL PF 07/13/2023   Influenza, Quadrivalent, Recombinant, Inj, Pf 06/18/2018, 05/04/2019, 06/15/2020   Influenza-Unspecified 09/15/2019, 07/08/2021, 07/08/2023   Moderna Covid-19 Fall Seasonal Vaccine 90yrs & older 01/05/2024   Moderna Covid-19 Vaccine  Bivalent Booster 8yrs & up 07/21/2023   Moderna Sars-Covid-2 Vaccination 09/15/2019   PFIZER(Purple Top)SARS-COV-2 Vaccination 10/03/2019, 10/22/2019, 09/03/2020   PPD Test 10/09/2020   Pfizer Covid-19 Vaccine Bivalent Booster 90yrs &  up 06/04/2021   Pneumococcal Conjugate-13 05/28/2014   Pneumococcal Polysaccharide-23 07/15/1998, 06/15/2005   RSV,unspecified 08/29/2022   Tdap 05/26/2016   Unspecified SARS-COV-2 Vaccination 03/03/2022   Zoster, Live 05/18/2013, 05/28/2014   Pertinent  Health Maintenance Due  Topic Date Due   Influenza Vaccine  04/14/2024   DEXA SCAN  Discontinued      07/23/2022    2:47 PM 05/28/2023   12:41 PM 12/24/2023    3:54 PM 02/25/2024    2:24 PM 04/21/2024    2:21 PM  Fall Risk  Falls in the past year? 0 1 1 1 1   Was there an injury with Fall? 0 1 0 0 0  Fall Risk Category Calculator 0 3 1 1 2   Fall Risk Category (Retired) Low       (RETIRED) Patient Fall Risk Level Low fall risk       Patient at Risk for Falls Due to No Fall Risks History of fall(s);Impaired balance/gait;Impaired mobility History of fall(s);Impaired balance/gait  History of fall(s);Impaired balance/gait History of fall(s);Impaired balance/gait;Impaired mobility  Fall risk Follow up Falls evaluation completed  Falls evaluation completed;Education provided;Falls prevention discussed Falls evaluation completed Falls evaluation completed;Education provided Falls evaluation completed;Education provided     Data saved with a previous flowsheet row definition   Functional Status Survey:    Vitals:   05/24/24 1526  BP: 125/68  Pulse: 68  Resp: 18  Temp: (!) 97.2 F (36.2 C)  SpO2: 98%  Weight: 148 lb 9.6 oz (67.4 kg)  Height: 5' 5 (1.651 m)   Body mass index is 24.73 kg/m. Physical Exam Vitals reviewed.  Constitutional:      General: She is not in acute distress. HENT:     Head: Normocephalic.     Right Ear: There is impacted cerumen.     Left Ear: There is impacted cerumen.     Nose: Nose normal.     Mouth/Throat:     Mouth: Mucous membranes are moist.  Eyes:     General:        Right eye: No discharge.        Left eye: No discharge.  Cardiovascular:     Rate and Rhythm: Normal rate and regular rhythm.      Pulses: Normal pulses.     Heart sounds: Normal heart sounds.  Pulmonary:     Effort: Pulmonary effort is normal. No respiratory distress.     Breath sounds: Normal breath sounds. No wheezing or rales.  Abdominal:     General: Bowel sounds are normal. There is no distension.     Palpations: Abdomen is soft.     Tenderness: There is no abdominal tenderness.  Musculoskeletal:     Cervical back: Neck supple.     Right lower leg: Edema present.     Left lower leg: Edema present.     Comments: BLE non pitting  Skin:    General: Skin is warm.     Capillary Refill: Capillary refill takes less than 2 seconds.  Neurological:     General: No focal deficit present.     Mental Status: She is alert. Mental status is at baseline.     Gait: Gait abnormal.  Psychiatric:        Mood and Affect: Mood normal.     Comments: Very pleasant, aphasia, alert to self/familiar face, follows commands     Labs reviewed: Recent Labs    08/09/23 0000 12/17/23 0000  NA 140 140  K 4.4 4.3  CL 107 108  CO2 25* 25*  BUN 34* 37*  CREATININE 1.4* 1.3*  CALCIUM 9.1 8.9   Recent Labs    08/09/23 0000 12/17/23 0000  AST 14 17  ALT 10 14  ALKPHOS 118 117  ALBUMIN 3.4* 3.2*   Recent Labs    08/09/23 0000 12/17/23 0000 01/17/24 0000  WBC 4.6 5.0 4.2  NEUTROABS  --  2,315.00 1,768.00  HGB 10.4* 9.9* 11.5*  HCT 31* 30* 35*  PLT 231 249 293   Lab Results  Component Value Date   TSH 4.09 10/21/2021   No results found for: HGBA1C Lab Results  Component Value Date   CHOL 156 08/27/2020   HDL 69 08/27/2020   LDLCALC 75 08/27/2020   TRIG 62 08/27/2020   CHOLHDL 2.8 02/15/2010    Significant Diagnostic Results in last 30 days:  No results found.  Assessment/Plan 1. Primary osteoarthritis involving multiple joints (Primary) - ongoing - worsening arthritis with physical decline > 3 months -  improved pain with scheduled tramadol   2. Bilateral leg edema - nonpitting - cont  torsmeide  3. Mixed Alzheimer and vascular dementia (HCC) - no behaviors - recent BIMS 5/15 - adjusting well to SNF - dependent with ADLs except feeding - cont Namenda   4. Stage 3b chronic kidney disease (HCC) - encourage hydration with water - avoid NSAIDS - on torsemide  5. Age-related osteoporosis without current pathological fracture - nonambulatory  - cont calcium     Family/ staff Communication: plan discussed with patient and nurse  Labs/tests ordered:  cbc/diff, cmp, TSH 06/15/2024

## 2024-05-24 NOTE — Addendum Note (Signed)
 Addended by: Denaja Verhoeven E on: 05/24/2024 04:45 PM   Modules accepted: Orders

## 2024-05-25 DIAGNOSIS — R278 Other lack of coordination: Secondary | ICD-10-CM | POA: Diagnosis not present

## 2024-05-25 DIAGNOSIS — M6281 Muscle weakness (generalized): Secondary | ICD-10-CM | POA: Diagnosis not present

## 2024-05-25 DIAGNOSIS — Z9181 History of falling: Secondary | ICD-10-CM | POA: Diagnosis not present

## 2024-05-25 DIAGNOSIS — M25551 Pain in right hip: Secondary | ICD-10-CM | POA: Diagnosis not present

## 2024-05-25 DIAGNOSIS — R41841 Cognitive communication deficit: Secondary | ICD-10-CM | POA: Diagnosis not present

## 2024-05-25 DIAGNOSIS — R2681 Unsteadiness on feet: Secondary | ICD-10-CM | POA: Diagnosis not present

## 2024-05-26 DIAGNOSIS — R41841 Cognitive communication deficit: Secondary | ICD-10-CM | POA: Diagnosis not present

## 2024-05-26 DIAGNOSIS — R278 Other lack of coordination: Secondary | ICD-10-CM | POA: Diagnosis not present

## 2024-05-26 DIAGNOSIS — M25551 Pain in right hip: Secondary | ICD-10-CM | POA: Diagnosis not present

## 2024-05-26 DIAGNOSIS — Z9181 History of falling: Secondary | ICD-10-CM | POA: Diagnosis not present

## 2024-05-26 DIAGNOSIS — R2681 Unsteadiness on feet: Secondary | ICD-10-CM | POA: Diagnosis not present

## 2024-05-26 DIAGNOSIS — M6281 Muscle weakness (generalized): Secondary | ICD-10-CM | POA: Diagnosis not present

## 2024-05-29 DIAGNOSIS — Z9181 History of falling: Secondary | ICD-10-CM | POA: Diagnosis not present

## 2024-05-29 DIAGNOSIS — M6281 Muscle weakness (generalized): Secondary | ICD-10-CM | POA: Diagnosis not present

## 2024-05-29 DIAGNOSIS — R278 Other lack of coordination: Secondary | ICD-10-CM | POA: Diagnosis not present

## 2024-05-29 DIAGNOSIS — R2681 Unsteadiness on feet: Secondary | ICD-10-CM | POA: Diagnosis not present

## 2024-05-29 DIAGNOSIS — M25551 Pain in right hip: Secondary | ICD-10-CM | POA: Diagnosis not present

## 2024-05-29 DIAGNOSIS — R41841 Cognitive communication deficit: Secondary | ICD-10-CM | POA: Diagnosis not present

## 2024-05-30 DIAGNOSIS — R2681 Unsteadiness on feet: Secondary | ICD-10-CM | POA: Diagnosis not present

## 2024-05-30 DIAGNOSIS — R278 Other lack of coordination: Secondary | ICD-10-CM | POA: Diagnosis not present

## 2024-05-30 DIAGNOSIS — M6281 Muscle weakness (generalized): Secondary | ICD-10-CM | POA: Diagnosis not present

## 2024-05-30 DIAGNOSIS — Z9181 History of falling: Secondary | ICD-10-CM | POA: Diagnosis not present

## 2024-05-30 DIAGNOSIS — R41841 Cognitive communication deficit: Secondary | ICD-10-CM | POA: Diagnosis not present

## 2024-05-30 DIAGNOSIS — M25551 Pain in right hip: Secondary | ICD-10-CM | POA: Diagnosis not present

## 2024-05-31 DIAGNOSIS — M25551 Pain in right hip: Secondary | ICD-10-CM | POA: Diagnosis not present

## 2024-05-31 DIAGNOSIS — R41841 Cognitive communication deficit: Secondary | ICD-10-CM | POA: Diagnosis not present

## 2024-05-31 DIAGNOSIS — R278 Other lack of coordination: Secondary | ICD-10-CM | POA: Diagnosis not present

## 2024-05-31 DIAGNOSIS — R2681 Unsteadiness on feet: Secondary | ICD-10-CM | POA: Diagnosis not present

## 2024-05-31 DIAGNOSIS — Z9181 History of falling: Secondary | ICD-10-CM | POA: Diagnosis not present

## 2024-05-31 DIAGNOSIS — M6281 Muscle weakness (generalized): Secondary | ICD-10-CM | POA: Diagnosis not present

## 2024-06-01 DIAGNOSIS — Z9181 History of falling: Secondary | ICD-10-CM | POA: Diagnosis not present

## 2024-06-01 DIAGNOSIS — R41841 Cognitive communication deficit: Secondary | ICD-10-CM | POA: Diagnosis not present

## 2024-06-01 DIAGNOSIS — M6281 Muscle weakness (generalized): Secondary | ICD-10-CM | POA: Diagnosis not present

## 2024-06-01 DIAGNOSIS — R2681 Unsteadiness on feet: Secondary | ICD-10-CM | POA: Diagnosis not present

## 2024-06-01 DIAGNOSIS — R278 Other lack of coordination: Secondary | ICD-10-CM | POA: Diagnosis not present

## 2024-06-01 DIAGNOSIS — M25551 Pain in right hip: Secondary | ICD-10-CM | POA: Diagnosis not present

## 2024-06-02 ENCOUNTER — Encounter: Payer: Self-pay | Admitting: Orthopedic Surgery

## 2024-06-02 ENCOUNTER — Non-Acute Institutional Stay (SKILLED_NURSING_FACILITY): Admitting: Orthopedic Surgery

## 2024-06-02 DIAGNOSIS — F0150 Vascular dementia without behavioral disturbance: Secondary | ICD-10-CM | POA: Diagnosis not present

## 2024-06-02 DIAGNOSIS — F0280 Dementia in other diseases classified elsewhere without behavioral disturbance: Secondary | ICD-10-CM | POA: Diagnosis not present

## 2024-06-02 DIAGNOSIS — F320 Major depressive disorder, single episode, mild: Secondary | ICD-10-CM

## 2024-06-02 DIAGNOSIS — M25551 Pain in right hip: Secondary | ICD-10-CM | POA: Diagnosis not present

## 2024-06-02 DIAGNOSIS — R278 Other lack of coordination: Secondary | ICD-10-CM | POA: Diagnosis not present

## 2024-06-02 DIAGNOSIS — R2681 Unsteadiness on feet: Secondary | ICD-10-CM | POA: Diagnosis not present

## 2024-06-02 DIAGNOSIS — Z9181 History of falling: Secondary | ICD-10-CM | POA: Diagnosis not present

## 2024-06-02 DIAGNOSIS — G309 Alzheimer's disease, unspecified: Secondary | ICD-10-CM

## 2024-06-02 DIAGNOSIS — R41841 Cognitive communication deficit: Secondary | ICD-10-CM | POA: Diagnosis not present

## 2024-06-02 DIAGNOSIS — M6281 Muscle weakness (generalized): Secondary | ICD-10-CM | POA: Diagnosis not present

## 2024-06-02 MED ORDER — SERTRALINE HCL 25 MG PO TABS: 25.0000 mg | ORAL_TABLET | Freq: Every day | ORAL | Status: DC

## 2024-06-02 NOTE — Progress Notes (Signed)
 Location:   [facility] Room Number: 35-A Place of Service:  SNF [phone]) Provider:  Greig Cluster, NP  PCP: Charlanne Fredia CROME, MD  Patient Care Team: Charlanne Fredia CROME, MD as PCP - General (Internal Medicine) Pa, [facility] (Ophthalmology) Dermatology, [facility] (Dermatology)  Extended Emergency Contact Information Primary Emergency Contact: Jones,Beth Address: [address]          [place], [place] [address] [place] Home Phone: [phone] Mobile Phone: [phone] Relation: Daughter  Code Status:  DNR Goals of care: Advanced Directive information    06/02/2024   11:11 AM  Advanced Directives  Does Patient Have a Medical Advance Directive? Yes  Type of Estate agent of [place];Living will;Out of facility DNR (pink MOST or yellow form)  Does patient want to make changes to medical advance directive? No - Patient declined  Copy of Healthcare Power of Attorney in Chart? Yes - validated most recent copy scanned in chart (See row information)     Chief Complaint  Patient presents with   Depression    HPI:  Pt is a 89 y.o. female seen today for acute visit due to depression.   She currently resides in the skilled nursing unit at [facility]. PMH: HTN, HLD, venous insufficiency, GERD, dementia, right hip surgery, and diverticulitis.   Poor historian due to dementia. Nursing reports increased crying spells in evening. She also appears anxious when this occurs. She is taking Namenda  for dementia and behaviors. Afebrile. Vitals stable.    Past Medical History:  Diagnosis Date   Anxiety    Cataract    Chest pain 02/16/2010   Myoview, negative for pharmacologic-stress induced ischemia, EF 74   Complication of anesthesia 1954   hard to wake up   Diverticulosis    DUB (dysfunctional uterine bleeding)    Dysrhythmia    h/o PVCs- none recently- no meds   Fall    GERD (gastroesophageal reflux disease)     Hearing reduced    Hypertension    no meds currently   Loss of consciousness (HCC)    Memory loss    Osteoarthritis of left hip    Osteoporosis    Palpitation 02/02/2013   A CardioNet MCOT (mobile cardiac outpatient telemetry ): NSR with PACs; sinus tachycardia with ventricular trigeminy. Brief atrial run. Facet cardiac; ventricular trigeminy and accelerated idioventricular rhythm. No significant tachyarrhythmias.   Palpitations    Postmenopausal bleeding 11/01/2014   Unsteady gait    Varicose veins of both legs with pain    Past Surgical History:  Procedure Laterality Date   APPENDECTOMY     CATARACT EXTRACTION     both eyes   CESAREAN SECTION  1962, 1963, 8035,8030   x 4   COLONOSCOPY  01/14/2017   DIAGNOSTIC LAPAROSCOPY     ECTOPIC PREGNANCY SURGERY  1966   FRACTURE SURGERY     HIP SURGERY Right    HYSTEROSCOPY WITH D & C N/A 11/02/2014   Procedure: DILATATION AND CURETTAGE /HYSTEROSCOPY;  Surgeon: Ezzie Buba, MD;  Location: [facility];  Service: Gynecology;  Laterality: N/A;   Lower Extremity Venous Dopplers  01/2013   No thrombus or thrombophlebitis. R Greater Saphenous Vein - enlarged without other insufficiency (6 mm); right and left Small Saphenous Vein: Patent no valve insufficiency.   TONSILLECTOMY     TONSILLECTOMY AND ADENOIDECTOMY     TRANSTHORACIC ECHOCARDIOGRAM  01/2013   Normal LV size and function. EF 55-60%. Grade 1  diastolic dysfunction. Elevated LV filling pressures. No regional W. May.    Allergies  Allergen Reactions   Evista [Raloxifene]    Fosamax [Alendronate]    Penicillins    Polyoxyethylene Lauryl Ether [Sorbitan]    Moxifloxacin Rash    Allergies as of 06/02/2024       Reactions   Evista [raloxifene]    Fosamax [alendronate]    Penicillins    Polyoxyethylene Lauryl Ether [sorbitan]    Moxifloxacin Rash        Medication List        Accurate as of June 02, 2024 11:11 AM. If you have any questions, ask your nurse or  doctor.          acetaminophen  500 MG tablet Commonly known as: TYLENOL  Take 1,000 mg by mouth as needed.   acetaminophen  500 MG tablet Commonly known as: TYLENOL  Take 2 tablets (1,000 mg total) by mouth 3 (three) times daily as needed.   CALCIUM 600 PO Take 1 tablet by mouth daily.   CeraVe Lotn Apply 1 application. topically daily.   fluticasone 50 MCG/ACT nasal spray Commonly known as: FLONASE Place 1 spray into both nostrils daily.   magnesium hydroxide 400 MG/5ML suspension Commonly known as: MILK OF MAGNESIA Take 30 mLs by mouth daily as needed for mild constipation.   memantine  10 MG tablet Commonly known as: NAMENDA  Take 10 mg by mouth 2 (two) times daily.   MULTIPLE VITAMIN PO Take by mouth daily.   potassium chloride 10 MEQ tablet Commonly known as: KLOR-CON Take 10 mEq by mouth daily.   potassium chloride SA 20 MEQ tablet Commonly known as: KLOR-CON M Take 20 mEq by mouth daily.   torsemide 20 MG tablet Commonly known as: DEMADEX Take 40 mg by mouth daily.   TRAMADOL  HCL PO Take 50 mg by mouth in the morning and at bedtime.   Zinc Oxide 10 % Oint Apply topically daily as needed.        Review of Systems  Unable to perform ROS: Dementia    Immunization History  Administered Date(s) Administered   Fluad Quad(high Dose 65+) 07/08/2022   INFLUENZA, HIGH DOSE SEASONAL PF 07/13/2023   Influenza, Quadrivalent, Recombinant, Inj, Pf 06/18/2018, 05/04/2019, 06/15/2020   Influenza-Unspecified 09/15/2019, 07/08/2021, 07/08/2023   Moderna Covid-19 Fall Seasonal Vaccine 20yrs & older 01/05/2024   Moderna Covid-19 Vaccine  Bivalent Booster 28yrs & up 07/21/2023   Moderna Sars-Covid-2 Vaccination 09/15/2019   PFIZER(Purple Top)SARS-COV-2 Vaccination 10/03/2019, 10/22/2019, 09/03/2020   PPD Test 10/09/2020   Pfizer Covid-19 Vaccine Bivalent Booster 48yrs & up 06/04/2021   Pneumococcal Conjugate-13 05/28/2014   Pneumococcal Polysaccharide-23  07/15/1998, 06/15/2005   RSV,unspecified 08/29/2022   Tdap 05/26/2016   Unspecified SARS-COV-2 Vaccination 03/03/2022   Zoster, Live 05/18/2013, 05/28/2014   Pertinent  Health Maintenance Due  Topic Date Due   Influenza Vaccine  04/14/2024   DEXA SCAN  Discontinued      05/28/2023   12:41 PM 12/24/2023    3:54 PM 02/25/2024    2:24 PM 04/21/2024    2:21 PM 05/24/2024    3:41 PM  Fall Risk  Falls in the past year? 1 1 1 1 1   Was there an injury with Fall? 1 0 0 0 0  Fall Risk Category Calculator 3 1 1 2 2   Patient at Risk for Falls Due to History of fall(s);Impaired balance/gait;Impaired mobility History of fall(s);Impaired balance/gait History of fall(s);Impaired balance/gait History of fall(s);Impaired balance/gait;Impaired mobility History of fall(s);Impaired balance/gait  Fall risk  Follow up Falls evaluation completed;Education provided;Falls prevention discussed Falls evaluation completed Falls evaluation completed;Education provided Falls evaluation completed;Education provided Falls evaluation completed   Functional Status Survey:    Vitals:   06/02/24 1048  BP: 122/69  Pulse: 71  Resp: 16  Temp: 97.9 F (36.6 C)  SpO2: 99%  Weight: 148 lb 9.6 oz (67.4 kg)  Height: 5' 5 (1.651 m)   Body mass index is 24.73 kg/m. Physical Exam Vitals reviewed.  Constitutional:      General: She is not in acute distress. HENT:     Head: Normocephalic.  Eyes:     General:        Right eye: No discharge.        Left eye: No discharge.  Cardiovascular:     Rate and Rhythm: Normal rate and regular rhythm.     Pulses: Normal pulses.     Heart sounds: Normal heart sounds.  Pulmonary:     Effort: Pulmonary effort is normal.     Breath sounds: Normal breath sounds.  Abdominal:     General: Bowel sounds are normal. There is no distension.     Palpations: Abdomen is soft.     Tenderness: There is no abdominal tenderness.  Musculoskeletal:     Cervical back: Neck supple.     Right  lower leg: Edema present.     Left lower leg: Edema present.  Skin:    General: Skin is warm.     Capillary Refill: Capillary refill takes less than 2 seconds.  Neurological:     General: No focal deficit present.     Mental Status: She is alert. Mental status is at baseline.     Gait: Gait abnormal.  Psychiatric:     Comments: Mild aphasia, alert to self/familiar face     Labs reviewed: Recent Labs    08/09/23 0000 12/17/23 0000  NA 140 140  K 4.4 4.3  CL 107 108  CO2 25* 25*  BUN 34* 37*  CREATININE 1.4* 1.3*  CALCIUM 9.1 8.9   Recent Labs    08/09/23 0000 12/17/23 0000  AST 14 17  ALT 10 14  ALKPHOS 118 117  ALBUMIN 3.4* 3.2*   Recent Labs    08/09/23 0000 12/17/23 0000 01/17/24 0000  WBC 4.6 5.0 4.2  NEUTROABS  --  2,315.00 1,768.00  HGB 10.4* 9.9* 11.5*  HCT 31* 30* 35*  PLT 231 249 293   Lab Results  Component Value Date   TSH 4.09 10/21/2021   No results found for: HGBA1C Lab Results  Component Value Date   CHOL 156 08/27/2020   HDL 69 08/27/2020   LDLCALC 75 08/27/2020   TRIG 62 08/27/2020   CHOLHDL 2.8 02/15/2010    Significant Diagnostic Results in last 30 days:  No results found.  Assessment/Plan 1. Current mild episode of major depressive disorder without prior episode (HCC) (Primary) - increased crying spells per nursing  - unable to obtain PHQ score due to aphasia/dementia - no h/o hyponatremia> recommend SSRI - discussed sorbitan allergy with [facility]> Zoloft  ok to start  - sertraline  (ZOLOFT ) 25 MG tablet; Take 1 tablet (25 mg total) by mouth daily. - bmp in 2 week to check sodium level   2. Mixed Alzheimer and vascular dementia (HCC) - see above - dependent with ADLs except feeding - weight stable - ambulates with wheelchair - cont Namenda      Family/ staff Communication: plan discussed with patient and nurse  Labs/tests ordered:  bmp in 2 weeks

## 2024-06-05 DIAGNOSIS — R278 Other lack of coordination: Secondary | ICD-10-CM | POA: Diagnosis not present

## 2024-06-05 DIAGNOSIS — M6281 Muscle weakness (generalized): Secondary | ICD-10-CM | POA: Diagnosis not present

## 2024-06-05 DIAGNOSIS — R2681 Unsteadiness on feet: Secondary | ICD-10-CM | POA: Diagnosis not present

## 2024-06-05 DIAGNOSIS — M25551 Pain in right hip: Secondary | ICD-10-CM | POA: Diagnosis not present

## 2024-06-05 DIAGNOSIS — Z9181 History of falling: Secondary | ICD-10-CM | POA: Diagnosis not present

## 2024-06-05 DIAGNOSIS — R41841 Cognitive communication deficit: Secondary | ICD-10-CM | POA: Diagnosis not present

## 2024-06-06 ENCOUNTER — Non-Acute Institutional Stay (SKILLED_NURSING_FACILITY): Payer: Self-pay | Admitting: Adult Health

## 2024-06-06 ENCOUNTER — Encounter: Payer: Self-pay | Admitting: Adult Health

## 2024-06-06 DIAGNOSIS — G309 Alzheimer's disease, unspecified: Secondary | ICD-10-CM

## 2024-06-06 DIAGNOSIS — M25551 Pain in right hip: Secondary | ICD-10-CM | POA: Diagnosis not present

## 2024-06-06 DIAGNOSIS — R41841 Cognitive communication deficit: Secondary | ICD-10-CM | POA: Diagnosis not present

## 2024-06-06 DIAGNOSIS — M6281 Muscle weakness (generalized): Secondary | ICD-10-CM | POA: Diagnosis not present

## 2024-06-06 DIAGNOSIS — F0150 Vascular dementia without behavioral disturbance: Secondary | ICD-10-CM

## 2024-06-06 DIAGNOSIS — F0280 Dementia in other diseases classified elsewhere without behavioral disturbance: Secondary | ICD-10-CM | POA: Diagnosis not present

## 2024-06-06 DIAGNOSIS — R600 Localized edema: Secondary | ICD-10-CM

## 2024-06-06 DIAGNOSIS — R278 Other lack of coordination: Secondary | ICD-10-CM | POA: Diagnosis not present

## 2024-06-06 DIAGNOSIS — Z9181 History of falling: Secondary | ICD-10-CM | POA: Diagnosis not present

## 2024-06-06 DIAGNOSIS — R112 Nausea with vomiting, unspecified: Secondary | ICD-10-CM | POA: Diagnosis not present

## 2024-06-06 DIAGNOSIS — R410 Disorientation, unspecified: Secondary | ICD-10-CM | POA: Diagnosis not present

## 2024-06-06 DIAGNOSIS — K5901 Slow transit constipation: Secondary | ICD-10-CM

## 2024-06-06 DIAGNOSIS — R2681 Unsteadiness on feet: Secondary | ICD-10-CM | POA: Diagnosis not present

## 2024-06-06 NOTE — Progress Notes (Signed)
 Location:  [facility] curator of Service:  SNF (31) Provider:  Medina-Vargas, Fraida Veldman, DNP, FNP-BC  Patient Care Team: Charlanne Fredia CROME, MD as PCP - General (Internal Medicine) Pa, [facility] (Ophthalmology) Dermatology, [facility] (Dermatology)  Extended Emergency Contact Information Primary Emergency Contact: Jones,Beth Address: [address]          [place], [place] [address] [place] Home Phone: [phone] Mobile Phone: [phone] Relation: Daughter  Code Status:  DNR  Goals of care: Advanced Directive information    06/02/2024   11:11 AM  Advanced Directives  Does Patient Have a Medical Advance Directive? Yes  Type of Estate agent of [place];Living will;Out of facility DNR (pink MOST or yellow form)  Does patient want to make changes to medical advance directive? No - Patient declined  Copy of Healthcare Power of Attorney in Chart? Yes - validated most recent copy scanned in chart (See row information)     Chief Complaint  Patient presents with   Acute Visit    Vomited X 1, feels tired    HPI:  Pt is a 89 y.o. female seen today for an acute visit regarding vomiting X 1, feels tired and confusion. She is a resident of [id number]. She vomited X 1 this morning and was given Zofran  4 mg PO X 1. She complained of being tired. Per daughter, she has noted that resident has been more confused recently.  She has Alzheimer's dementia and currently taking memantine  10 mg twice a day.  Latest BIMS score 5/15, ranging as severe cognitive impairment.  She has bilateral lower extremity edema.  She takes torsemide 40 mg daily for lower extremity.  Per charge nurse, he had no BM since 05/27/24.   Past Medical History:  Diagnosis Date   Anxiety    Cataract    Chest pain 02/16/2010   Myoview, negative for pharmacologic-stress induced ischemia, EF 74   Complication of anesthesia 1954   hard to wake up    Diverticulosis    DUB (dysfunctional uterine bleeding)    Dysrhythmia    h/o PVCs- none recently- no meds   Fall    GERD (gastroesophageal reflux disease)    Hearing reduced    Hypertension    no meds currently   Loss of consciousness (HCC)    Memory loss    Osteoarthritis of left hip    Osteoporosis    Palpitation 02/02/2013   A CardioNet MCOT (mobile cardiac outpatient telemetry ): NSR with PACs; sinus tachycardia with ventricular trigeminy. Brief atrial run. Facet cardiac; ventricular trigeminy and accelerated idioventricular rhythm. No significant tachyarrhythmias.   Palpitations    Postmenopausal bleeding 11/01/2014   Unsteady gait    Varicose veins of both legs with pain    Past Surgical History:  Procedure Laterality Date   APPENDECTOMY     CATARACT EXTRACTION     both eyes   CESAREAN SECTION  1962, 1963, 8035,8030   x 4   COLONOSCOPY  01/14/2017   DIAGNOSTIC LAPAROSCOPY     ECTOPIC PREGNANCY SURGERY  1966   FRACTURE SURGERY     HIP SURGERY Right    HYSTEROSCOPY WITH D & C N/A 11/02/2014   Procedure: DILATATION AND CURETTAGE /HYSTEROSCOPY;  Surgeon: Ezzie Buba, MD;  Location: [facility];  Service: Gynecology;  Laterality: N/A;   Lower Extremity Venous Dopplers  01/2013   No thrombus or thrombophlebitis. R Greater Saphenous Vein - enlarged without other insufficiency (6  mm); right and left Small Saphenous Vein: Patent no valve insufficiency.   TONSILLECTOMY     TONSILLECTOMY AND ADENOIDECTOMY     TRANSTHORACIC ECHOCARDIOGRAM  01/2013   Normal LV size and function. EF 55-60%. Grade 1 diastolic dysfunction. Elevated LV filling pressures. No regional W. May.    Allergies  Allergen Reactions   Evista [Raloxifene]    Fosamax [Alendronate]    Penicillins    Polyoxyethylene Lauryl Ether [Sorbitan]    Moxifloxacin Rash    Outpatient Encounter Medications as of 06/06/2024  Medication Sig   acetaminophen  (TYLENOL ) 500 MG tablet Take 2 tablets (1,000 mg total) by  mouth 3 (three) times daily as needed.   acetaminophen  (TYLENOL ) 500 MG tablet Take 1,000 mg by mouth as needed.   Calcium Carbonate (CALCIUM 600 PO) Take 1 tablet by mouth daily.   Emollient (CERAVE) LOTN Apply 1 application. topically daily.   fluticasone (FLONASE) 50 MCG/ACT nasal spray Place 1 spray into both nostrils daily.   memantine  (NAMENDA ) 10 MG tablet Take 10 mg by mouth 2 (two) times daily.   MULTIPLE VITAMIN PO Take by mouth daily.   potassium chloride (KLOR-CON) 10 MEQ tablet Take 10 mEq by mouth daily.   potassium chloride SA (KLOR-CON M) 20 MEQ tablet Take 20 mEq by mouth daily.   sertraline  (ZOLOFT ) 25 MG tablet Take 1 tablet (25 mg total) by mouth daily.   torsemide (DEMADEX) 20 MG tablet Take 40 mg by mouth daily.   TRAMADOL  HCL PO Take 50 mg by mouth in the morning and at bedtime.   Zinc Oxide 10 % OINT Apply topically daily as needed.   No facility-administered encounter medications on file as of 06/06/2024.    Review of Systems   Unable to obtain due to dementia.   Immunization History  Administered Date(s) Administered   Fluad Quad(high Dose 65+) 07/08/2022   INFLUENZA, HIGH DOSE SEASONAL PF 07/13/2023   Influenza, Quadrivalent, Recombinant, Inj, Pf 06/18/2018, 05/04/2019, 06/15/2020   Influenza-Unspecified 09/15/2019, 07/08/2021, 07/08/2023   Moderna Covid-19 Fall Seasonal Vaccine 40yrs & older 01/05/2024   Moderna Covid-19 Vaccine  Bivalent Booster 54yrs & up 07/21/2023   Moderna Sars-Covid-2 Vaccination 09/15/2019   PFIZER(Purple Top)SARS-COV-2 Vaccination 10/03/2019, 10/22/2019, 09/03/2020   PPD Test 10/09/2020   Pfizer Covid-19 Vaccine Bivalent Booster 98yrs & up 06/04/2021   Pneumococcal Conjugate-13 05/28/2014   Pneumococcal Polysaccharide-23 07/15/1998, 06/15/2005   RSV,unspecified 08/29/2022   Tdap 05/26/2016   Unspecified SARS-COV-2 Vaccination 03/03/2022   Zoster, Live 05/18/2013, 05/28/2014   Pertinent  Health Maintenance Due  Topic Date Due    Influenza Vaccine  04/14/2024   DEXA SCAN  Discontinued      12/24/2023    3:54 PM 02/25/2024    2:24 PM 04/21/2024    2:21 PM 05/24/2024    3:41 PM 06/02/2024   12:48 PM  Fall Risk  Falls in the past year? 1 1 1 1 1   Was there an injury with Fall? 0 0 0 0 0  Fall Risk Category Calculator 1 1 2 2 2   Patient at Risk for Falls Due to History of fall(s);Impaired balance/gait History of fall(s);Impaired balance/gait History of fall(s);Impaired balance/gait;Impaired mobility History of fall(s);Impaired balance/gait History of fall(s);Impaired balance/gait  Fall risk Follow up Falls evaluation completed Falls evaluation completed;Education provided Falls evaluation completed;Education provided Falls evaluation completed Falls evaluation completed;Education provided     Vitals:   06/06/24 1615  BP: 122/69  Pulse: 71  Resp: 16  Temp: 97.9 F (36.6 C)  SpO2: 99%  Weight: 148 lb 9.6 oz (67.4 kg)  Height: 5' 6 (1.676 m)   Body mass index is 23.98 kg/m.  Physical Exam Constitutional:      General: She is not in acute distress.    Appearance: Normal appearance.  HENT:     Head: Normocephalic and atraumatic.     Nose: Nose normal.     Mouth/Throat:     Mouth: Mucous membranes are moist.  Eyes:     Conjunctiva/sclera: Conjunctivae normal.  Cardiovascular:     Rate and Rhythm: Normal rate and regular rhythm.  Pulmonary:     Effort: Pulmonary effort is normal.     Breath sounds: Normal breath sounds.  Abdominal:     General: Bowel sounds are normal.     Palpations: Abdomen is soft.  Musculoskeletal:     Cervical back: Normal range of motion.     Right lower leg: Edema present.     Left lower leg: Edema present.  Skin:    General: Skin is warm and dry.  Psychiatric:        Mood and Affect: Mood normal.        Behavior: Behavior normal.      Labs reviewed: Recent Labs    08/09/23 0000 12/17/23 0000  NA 140 140  K 4.4 4.3  CL 107 108  CO2 25* 25*  BUN 34* 37*   CREATININE 1.4* 1.3*  CALCIUM 9.1 8.9   Recent Labs    08/09/23 0000 12/17/23 0000  AST 14 17  ALT 10 14  ALKPHOS 118 117  ALBUMIN 3.4* 3.2*   Recent Labs    08/09/23 0000 12/17/23 0000 01/17/24 0000  WBC 4.6 5.0 4.2  NEUTROABS  --  2,315.00 1,768.00  HGB 10.4* 9.9* 11.5*  HCT 31* 30* 35*  PLT 231 249 293   Lab Results  Component Value Date   TSH 4.09 10/21/2021   No results found for: HGBA1C Lab Results  Component Value Date   CHOL 156 08/27/2020   HDL 69 08/27/2020   LDLCALC 75 08/27/2020   TRIG 62 08/27/2020   CHOLHDL 2.8 02/15/2010    Significant Diagnostic Results in last 30 days:  No results found.  Assessment/Plan  1. 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 Nausea and vomiting, unspecified vomiting type -   Vomited x 1 -  Will start on Zofran  4 mg every 8 hours as needed    3. Lower extremity edema -   Continue torsemide 40 mg daily  4. Slow transit constipation - no BM x 10 days per record -   Will start on senna S8 0.6/50 mg 2 tabs daily    Family/ staff Communication: Discussed plan of care with charge nurse.  Labs/tests ordered:  BMP, CBC and urinalysis with culture    Jereld Serum, DNP, MSN, FNP-BC [facility] and [facility] [phone] (Monday-Friday 8:00 a.m. - 5:00 p.m.) [phone] (after hours)

## 2024-06-07 ENCOUNTER — Other Ambulatory Visit: Payer: Self-pay | Admitting: Orthopedic Surgery

## 2024-06-07 DIAGNOSIS — M6281 Muscle weakness (generalized): Secondary | ICD-10-CM | POA: Diagnosis not present

## 2024-06-07 DIAGNOSIS — R41841 Cognitive communication deficit: Secondary | ICD-10-CM | POA: Diagnosis not present

## 2024-06-07 DIAGNOSIS — R2681 Unsteadiness on feet: Secondary | ICD-10-CM | POA: Diagnosis not present

## 2024-06-07 DIAGNOSIS — R278 Other lack of coordination: Secondary | ICD-10-CM | POA: Diagnosis not present

## 2024-06-07 DIAGNOSIS — Z9181 History of falling: Secondary | ICD-10-CM | POA: Diagnosis not present

## 2024-06-07 DIAGNOSIS — M25551 Pain in right hip: Secondary | ICD-10-CM | POA: Diagnosis not present

## 2024-06-07 MED ORDER — TRAMADOL HCL 50 MG PO TABS: 50.0000 mg | ORAL_TABLET | Freq: Two times a day (BID) | ORAL | 0 refills | Status: DC

## 2024-06-09 ENCOUNTER — Encounter: Payer: Self-pay | Admitting: Orthopedic Surgery

## 2024-06-09 ENCOUNTER — Non-Acute Institutional Stay (SKILLED_NURSING_FACILITY): Payer: Self-pay | Admitting: Orthopedic Surgery

## 2024-06-09 DIAGNOSIS — R110 Nausea: Secondary | ICD-10-CM | POA: Diagnosis not present

## 2024-06-09 DIAGNOSIS — R197 Diarrhea, unspecified: Secondary | ICD-10-CM | POA: Diagnosis not present

## 2024-06-09 DIAGNOSIS — R278 Other lack of coordination: Secondary | ICD-10-CM | POA: Diagnosis not present

## 2024-06-09 DIAGNOSIS — R41841 Cognitive communication deficit: Secondary | ICD-10-CM | POA: Diagnosis not present

## 2024-06-09 DIAGNOSIS — M25551 Pain in right hip: Secondary | ICD-10-CM | POA: Diagnosis not present

## 2024-06-09 DIAGNOSIS — R2681 Unsteadiness on feet: Secondary | ICD-10-CM | POA: Diagnosis not present

## 2024-06-09 DIAGNOSIS — F03918 Unspecified dementia, unspecified severity, with other behavioral disturbance: Secondary | ICD-10-CM

## 2024-06-09 DIAGNOSIS — M6281 Muscle weakness (generalized): Secondary | ICD-10-CM | POA: Diagnosis not present

## 2024-06-09 DIAGNOSIS — Z9181 History of falling: Secondary | ICD-10-CM | POA: Diagnosis not present

## 2024-06-09 MED ORDER — DIVALPROEX SODIUM 125 MG PO CSDR: 125.0000 mg | DELAYED_RELEASE_CAPSULE | Freq: Every day | ORAL | Status: DC

## 2024-06-09 NOTE — Progress Notes (Addendum)
 Location:  [facility] Room Number: 35/A Place of Service:  SNF [phone]) Provider:  Greig FORBES Cluster, NP   Charlanne Fredia CROME, MD  Patient Care Team: Charlanne Fredia CROME, MD as PCP - General (Internal Medicine) Pa, [facility] (Ophthalmology) Dermatology, [facility] (Dermatology)  Extended Emergency Contact Information Primary Emergency Contact: Jones,Beth Address: [address]          [place], [place] [address] [place] Home Phone: [phone] Mobile Phone: [phone] Relation: Daughter  Code Status:  DNR Goals of care: Advanced Directive information    06/02/2024   11:11 AM  Advanced Directives  Does Patient Have a Medical Advance Directive? Yes  Type of Estate agent of [place];Living will;Out of facility DNR (pink MOST or yellow form)  Does patient want to make changes to medical advance directive? No - Patient declined  Copy of Healthcare Power of Attorney in Chart? Yes - validated most recent copy scanned in chart (See row information)     Chief Complaint  Patient presents with   Acute Visit    Increased confusion/anxiety    HPI:  Pt is a 89 y.o. female seen today for acute visit due to increased confusion and anxiety.   She currently resides in the skilled nursing unit at [facility]. PMH: HTN, HLD, venous insufficiency, GERD, dementia, right hip surgery, and diverticulitis.   Poor historian due to dementia. Increased confusion > 1 week. She has been observed asking for husband and telling elaborate stories about gambling ring. 09/23 cbc/diff, bmp unremarkable. 09/23 urine culture < 10,000 bacteria. 09/19 started on Zoloft  due to depression, crying spells. She is also on Namenda . Afebrile. Vitals stable.   2 episodes of nausea. She had episode of diarrhea while I was in room. LBM 09/20 per chart review. She does not take anything for constipation.    Past Medical History:  Diagnosis Date   Anxiety     Cataract    Chest pain 02/16/2010   Myoview, negative for pharmacologic-stress induced ischemia, EF 74   Complication of anesthesia 1954   hard to wake up   Diverticulosis    DUB (dysfunctional uterine bleeding)    Dysrhythmia    h/o PVCs- none recently- no meds   Fall    GERD (gastroesophageal reflux disease)    Hearing reduced    Hypertension    no meds currently   Loss of consciousness (HCC)    Memory loss    Osteoarthritis of left hip    Osteoporosis    Palpitation 02/02/2013   A CardioNet MCOT (mobile cardiac outpatient telemetry ): NSR with PACs; sinus tachycardia with ventricular trigeminy. Brief atrial run. Facet cardiac; ventricular trigeminy and accelerated idioventricular rhythm. No significant tachyarrhythmias.   Palpitations    Postmenopausal bleeding 11/01/2014   Unsteady gait    Varicose veins of both legs with pain    Past Surgical History:  Procedure Laterality Date   APPENDECTOMY     CATARACT EXTRACTION     both eyes   CESAREAN SECTION  1962, 1963, 8035,8030   x 4   COLONOSCOPY  01/14/2017   DIAGNOSTIC LAPAROSCOPY     ECTOPIC PREGNANCY SURGERY  1966   FRACTURE SURGERY     HIP SURGERY Right    HYSTEROSCOPY WITH D & C N/A 11/02/2014   Procedure: DILATATION AND CURETTAGE /HYSTEROSCOPY;  Surgeon: Ezzie Buba, MD;  Location: [facility];  Service: Gynecology;  Laterality: N/A;   Lower Extremity Venous Dopplers  01/2013   No thrombus or thrombophlebitis. R Greater Saphenous Vein - enlarged without other insufficiency (6 mm); right and left Small Saphenous Vein: Patent no valve insufficiency.   TONSILLECTOMY     TONSILLECTOMY AND ADENOIDECTOMY     TRANSTHORACIC ECHOCARDIOGRAM  01/2013   Normal LV size and function. EF 55-60%. Grade 1 diastolic dysfunction. Elevated LV filling pressures. No regional W. May.    Allergies  Allergen Reactions   Evista [Raloxifene]    Fosamax [Alendronate]    Penicillins    Polyoxyethylene Lauryl Ether [Sorbitan]     Moxifloxacin Rash    Outpatient Encounter Medications as of 06/09/2024  Medication Sig   acetaminophen  (TYLENOL ) 500 MG tablet Take 2 tablets (1,000 mg total) by mouth 3 (three) times daily as needed.   acetaminophen  (TYLENOL ) 500 MG tablet Take 1,000 mg by mouth as needed.   Calcium Carbonate (CALCIUM 600 PO) Take 1 tablet by mouth daily.   Emollient (CERAVE) LOTN Apply 1 application. topically daily.   fluticasone (FLONASE) 50 MCG/ACT nasal spray Place 1 spray into both nostrils daily.   memantine  (NAMENDA ) 10 MG tablet Take 10 mg by mouth 2 (two) times daily.   MULTIPLE VITAMIN PO Take by mouth daily.   potassium chloride (KLOR-CON) 10 MEQ tablet Take 10 mEq by mouth daily.   potassium chloride SA (KLOR-CON M) 20 MEQ tablet Take 20 mEq by mouth daily.   sertraline  (ZOLOFT ) 25 MG tablet Take 1 tablet (25 mg total) by mouth daily.   torsemide (DEMADEX) 20 MG tablet Take 40 mg by mouth daily.   traMADol  (ULTRAM ) 50 MG tablet Take 1 tablet (50 mg total) by mouth in the morning and at bedtime.   Zinc Oxide 10 % OINT Apply topically daily as needed.   No facility-administered encounter medications on file as of 06/09/2024.    Review of Systems  Unable to perform ROS: Dementia    Immunization History  Administered Date(s) Administered   Fluad Quad(high Dose 65+) 07/08/2022   INFLUENZA, HIGH DOSE SEASONAL PF 07/13/2023   Influenza, Quadrivalent, Recombinant, Inj, Pf 06/18/2018, 05/04/2019, 06/15/2020   Influenza-Unspecified 09/15/2019, 07/08/2021, 07/08/2023   Moderna Covid-19 Fall Seasonal Vaccine 21yrs & older 01/05/2024   Moderna Covid-19 Vaccine  Bivalent Booster 33yrs & up 07/21/2023   Moderna Sars-Covid-2 Vaccination 09/15/2019   PFIZER(Purple Top)SARS-COV-2 Vaccination 10/03/2019, 10/22/2019, 09/03/2020   PPD Test 10/09/2020   Pfizer Covid-19 Vaccine Bivalent Booster 65yrs & up 06/04/2021   Pneumococcal Conjugate-13 05/28/2014   Pneumococcal Polysaccharide-23 07/15/1998,  06/15/2005   RSV,unspecified 08/29/2022   Tdap 05/26/2016   Unspecified SARS-COV-2 Vaccination 03/03/2022   Zoster, Live 05/18/2013, 05/28/2014   Pertinent  Health Maintenance Due  Topic Date Due   Influenza Vaccine  04/14/2024   DEXA SCAN  Discontinued      12/24/2023    3:54 PM 02/25/2024    2:24 PM 04/21/2024    2:21 PM 05/24/2024    3:41 PM 06/02/2024   12:48 PM  Fall Risk  Falls in the past year? 1 1 1 1 1   Was there an injury with Fall? 0 0 0 0 0  Fall Risk Category Calculator 1 1 2 2 2   Patient at Risk for Falls Due to History of fall(s);Impaired balance/gait History of fall(s);Impaired balance/gait History of fall(s);Impaired balance/gait;Impaired mobility History of fall(s);Impaired balance/gait History of fall(s);Impaired balance/gait  Fall risk Follow up Falls evaluation completed Falls evaluation completed;Education provided Falls evaluation completed;Education provided Falls evaluation completed Falls evaluation completed;Education provided   Functional Status Survey:  Vitals:   06/09/24 1532  BP: 121/74  Pulse: 89  Resp: 17  Temp: 98 F (36.7 C)  SpO2: 92%  Weight: 148 lb 9.6 oz (67.4 kg)  Height: 5' 6 (1.676 m)   Body mass index is 23.98 kg/m. Physical Exam Vitals reviewed.  Constitutional:      General: She is not in acute distress. HENT:     Head: Normocephalic.  Eyes:     General:        Right eye: No discharge.        Left eye: No discharge.  Cardiovascular:     Rate and Rhythm: Normal rate and regular rhythm.     Pulses: Normal pulses.     Heart sounds: Normal heart sounds.  Pulmonary:     Effort: Pulmonary effort is normal. No respiratory distress.     Breath sounds: Normal breath sounds. No wheezing, rhonchi or rales.  Abdominal:     General: There is distension.     Tenderness: There is no abdominal tenderness. There is no guarding.     Comments: Hypoactive bowel sounds x 4   Musculoskeletal:     Cervical back: Neck supple.     Right  lower leg: Edema present.     Left lower leg: Edema present.     Comments: Non pitting  Skin:    General: Skin is warm.     Capillary Refill: Capillary refill takes less than 2 seconds.  Neurological:     General: No focal deficit present.     Mental Status: She is alert. Mental status is at baseline.     Gait: Gait abnormal.  Psychiatric:        Mood and Affect: Mood normal.     Labs reviewed: Recent Labs    08/09/23 0000 12/17/23 0000  NA 140 140  K 4.4 4.3  CL 107 108  CO2 25* 25*  BUN 34* 37*  CREATININE 1.4* 1.3*  CALCIUM 9.1 8.9   Recent Labs    08/09/23 0000 12/17/23 0000  AST 14 17  ALT 10 14  ALKPHOS 118 117  ALBUMIN 3.4* 3.2*   Recent Labs    08/09/23 0000 12/17/23 0000 01/17/24 0000  WBC 4.6 5.0 4.2  NEUTROABS  --  2,315.00 1,768.00  HGB 10.4* 9.9* 11.5*  HCT 31* 30* 35*  PLT 231 249 293   Lab Results  Component Value Date   TSH 4.09 10/21/2021   No results found for: HGBA1C Lab Results  Component Value Date   CHOL 156 08/27/2020   HDL 69 08/27/2020   LDLCALC 75 08/27/2020   TRIG 62 08/27/2020   CHOLHDL 2.8 02/15/2010    Significant Diagnostic Results in last 30 days:  No results found.  Assessment/Plan 1. Nausea (Primary) - 2 episodes within past week - not on bowel regimen medications - diarrhea this morning - LBM 09/20 per chart review - KUB to r/o obstruction/ constipation> moderate increased stool in colon> will start miralax every other day- give 1st dose now > continue Senna qhs  2. Dementia with behavioral disturbance (HCC) - increased crying, confusion, anxiety x 1 week - 09/19 Zoloft  started - 09/23 cbc/diff, bmp, urine culture unremarkable - cont Namenda  - will start low dose Depakote  due to ongoing behaviors  - divalproex  (DEPAKOTE  SPRINKLE) 125MG  capsule; Take 1 capsule (125 mg total) by mouth daily.    Family/ staff Communication: plan discussed with nurse and daughter  Labs/tests ordered:  hepatic panel  in 2 weeks

## 2024-06-10 DIAGNOSIS — M6281 Muscle weakness (generalized): Secondary | ICD-10-CM | POA: Diagnosis not present

## 2024-06-10 DIAGNOSIS — Z9181 History of falling: Secondary | ICD-10-CM | POA: Diagnosis not present

## 2024-06-10 DIAGNOSIS — M25551 Pain in right hip: Secondary | ICD-10-CM | POA: Diagnosis not present

## 2024-06-10 DIAGNOSIS — R2681 Unsteadiness on feet: Secondary | ICD-10-CM | POA: Diagnosis not present

## 2024-06-10 DIAGNOSIS — R41841 Cognitive communication deficit: Secondary | ICD-10-CM | POA: Diagnosis not present

## 2024-06-10 DIAGNOSIS — R278 Other lack of coordination: Secondary | ICD-10-CM | POA: Diagnosis not present

## 2024-06-12 DIAGNOSIS — R278 Other lack of coordination: Secondary | ICD-10-CM | POA: Diagnosis not present

## 2024-06-12 DIAGNOSIS — Z9181 History of falling: Secondary | ICD-10-CM | POA: Diagnosis not present

## 2024-06-12 DIAGNOSIS — R41841 Cognitive communication deficit: Secondary | ICD-10-CM | POA: Diagnosis not present

## 2024-06-12 DIAGNOSIS — M6281 Muscle weakness (generalized): Secondary | ICD-10-CM | POA: Diagnosis not present

## 2024-06-12 DIAGNOSIS — M25551 Pain in right hip: Secondary | ICD-10-CM | POA: Diagnosis not present

## 2024-06-12 DIAGNOSIS — R2681 Unsteadiness on feet: Secondary | ICD-10-CM | POA: Diagnosis not present

## 2024-06-12 MED ORDER — POLYETHYLENE GLYCOL 3350 17 GM/SCOOP PO POWD: 17.0000 g | ORAL | Status: AC

## 2024-06-12 NOTE — Addendum Note (Signed)
 Addended byBETHA GIL NO E on: 06/12/2024 09:50 AM   Modules accepted: Orders

## 2024-06-13 DIAGNOSIS — Z9181 History of falling: Secondary | ICD-10-CM | POA: Diagnosis not present

## 2024-06-13 DIAGNOSIS — R278 Other lack of coordination: Secondary | ICD-10-CM | POA: Diagnosis not present

## 2024-06-13 DIAGNOSIS — M25551 Pain in right hip: Secondary | ICD-10-CM | POA: Diagnosis not present

## 2024-06-13 DIAGNOSIS — M6281 Muscle weakness (generalized): Secondary | ICD-10-CM | POA: Diagnosis not present

## 2024-06-13 DIAGNOSIS — R2681 Unsteadiness on feet: Secondary | ICD-10-CM | POA: Diagnosis not present

## 2024-06-13 DIAGNOSIS — R41841 Cognitive communication deficit: Secondary | ICD-10-CM | POA: Diagnosis not present

## 2024-06-14 ENCOUNTER — Non-Acute Institutional Stay (SKILLED_NURSING_FACILITY): Payer: Self-pay | Admitting: Orthopedic Surgery

## 2024-06-14 ENCOUNTER — Encounter: Payer: Self-pay | Admitting: Orthopedic Surgery

## 2024-06-14 DIAGNOSIS — M25562 Pain in left knee: Secondary | ICD-10-CM | POA: Diagnosis not present

## 2024-06-14 DIAGNOSIS — R41841 Cognitive communication deficit: Secondary | ICD-10-CM | POA: Diagnosis not present

## 2024-06-14 DIAGNOSIS — Z0000 Encounter for general adult medical examination without abnormal findings: Secondary | ICD-10-CM

## 2024-06-14 DIAGNOSIS — M25551 Pain in right hip: Secondary | ICD-10-CM | POA: Diagnosis not present

## 2024-06-14 DIAGNOSIS — M25561 Pain in right knee: Secondary | ICD-10-CM | POA: Diagnosis not present

## 2024-06-14 DIAGNOSIS — M25552 Pain in left hip: Secondary | ICD-10-CM | POA: Diagnosis not present

## 2024-06-14 DIAGNOSIS — R278 Other lack of coordination: Secondary | ICD-10-CM | POA: Diagnosis not present

## 2024-06-14 DIAGNOSIS — Z9181 History of falling: Secondary | ICD-10-CM | POA: Diagnosis not present

## 2024-06-14 DIAGNOSIS — M6281 Muscle weakness (generalized): Secondary | ICD-10-CM | POA: Diagnosis not present

## 2024-06-14 DIAGNOSIS — R2681 Unsteadiness on feet: Secondary | ICD-10-CM | POA: Diagnosis not present

## 2024-06-14 NOTE — Progress Notes (Signed)
 Subjective:   Melissa Valenzuela is a 89 y.o. female who presents for Medicare Annual (Subsequent) preventive examination.  Visit Complete: In person  Patient Medicare AWV questionnaire was completed by the patient on 06/14/2024; I have confirmed that all information answered by patient is correct and no changes since this date.  Cardiac Risk Factors include: advanced age (>24men, >85 women);hypertension;sedentary lifestyle;dyslipidemia     Objective:    Today's Vitals   06/14/24 1339  BP: 109/73  Pulse: 81  Resp: 17  Temp: 97.9 F (36.6 C)  SpO2: 100%  Weight: 138 lb 11.2 oz (62.9 kg)  Height: 5' 6 (1.676 m)   Body mass index is 22.39 kg/m.     06/02/2024   11:11 AM 05/24/2024    4:45 PM 03/12/2023   10:33 AM 01/14/2023   12:57 PM 11/11/2022   11:26 AM 11/04/2022   10:41 AM 04/20/2022   11:36 AM  Advanced Directives  Does Patient Have a Medical Advance Directive? Yes Yes Yes Yes Yes Yes Yes  Type of Estate agent of [place];Living will;Out of facility DNR (pink MOST or yellow form) Healthcare Power of [place];Living will;Out of facility DNR (pink MOST or yellow form) Healthcare Power of [place];Out of facility DNR (pink MOST or yellow form) Out of facility DNR (pink MOST or yellow form);Healthcare Power of [employer] of [place];Living will;Out of facility DNR (pink MOST or yellow form) Healthcare Power of [place];Living will;Out of facility DNR (pink MOST or yellow form) Healthcare Power of [place];Living will;Out of facility DNR (pink MOST or yellow form)  Does patient want to make changes to medical advance directive? No - Patient declined No - Patient declined No - Patient declined No - Patient declined No - Patient declined No - Patient declined No - Patient declined  Copy of Healthcare Power of Attorney in Chart? Yes - validated most recent copy scanned in chart (See row information) Yes - validated most recent copy scanned in chart  (See row information) Yes - validated most recent copy scanned in chart (See row information) Yes - validated most recent copy scanned in chart (See row information) Yes - validated most recent copy scanned in chart (See row information) Yes - validated most recent copy scanned in chart (See row information) Yes - validated most recent copy scanned in chart (See row information)    Current Medications (verified) Outpatient Encounter Medications as of 06/14/2024  Medication Sig   acetaminophen  (TYLENOL ) 500 MG tablet Take 2 tablets (1,000 mg total) by mouth 3 (three) times daily as needed.   acetaminophen  (TYLENOL ) 500 MG tablet Take 1,000 mg by mouth as needed.   Calcium Carbonate (CALCIUM 600 PO) Take 1 tablet by mouth daily.   divalproex  (DEPAKOTE  SPRINKLE) 125 MG capsule Take 1 capsule (125 mg total) by mouth daily.   Emollient (CERAVE) LOTN Apply 1 application. topically daily.   fluticasone (FLONASE) 50 MCG/ACT nasal spray Place 1 spray into both nostrils daily.   memantine  (NAMENDA ) 10 MG tablet Take 10 mg by mouth 2 (two) times daily.   MULTIPLE VITAMIN PO Take by mouth daily.   polyethylene glycol powder (GLYCOLAX/MIRALAX) 17 GM/SCOOP powder Take 17 g by mouth every other day. Dissolve 1 capful (17g) in 4-8 ounces of liquid and take by mouth daily.   potassium chloride (KLOR-CON) 10 MEQ tablet Take 10 mEq by mouth daily.   potassium chloride SA (KLOR-CON M) 20 MEQ tablet Take 20 mEq by mouth daily.   sertraline  (ZOLOFT ) 25 MG  tablet Take 1 tablet (25 mg total) by mouth daily.   torsemide (DEMADEX) 20 MG tablet Take 40 mg by mouth daily.   traMADol  (ULTRAM ) 50 MG tablet Take 1 tablet (50 mg total) by mouth in the morning and at bedtime.   Zinc Oxide 10 % OINT Apply topically daily as needed.   No facility-administered encounter medications on file as of 06/14/2024.    Allergies (verified) Evista [raloxifene], Fosamax [alendronate], Penicillins, Polyoxyethylene lauryl ether [sorbitan],  and Moxifloxacin   History: Past Medical History:  Diagnosis Date   Anxiety    Cataract    Chest pain 02/16/2010   Myoview, negative for pharmacologic-stress induced ischemia, EF 74   Complication of anesthesia 1954   hard to wake up   Diverticulosis    DUB (dysfunctional uterine bleeding)    Dysrhythmia    h/o PVCs- none recently- no meds   Fall    GERD (gastroesophageal reflux disease)    Hearing reduced    Hypertension    no meds currently   Loss of consciousness (HCC)    Memory loss    Osteoarthritis of left hip    Osteoporosis    Palpitation 02/02/2013   A [facility] MCOT (mobile cardiac outpatient telemetry ): NSR with PACs; sinus tachycardia with ventricular trigeminy. Brief atrial run. Facet cardiac; ventricular trigeminy and accelerated idioventricular rhythm. No significant tachyarrhythmias.   Palpitations    Postmenopausal bleeding 11/01/2014   Unsteady gait    Varicose veins of both legs with pain    Past Surgical History:  Procedure Laterality Date   APPENDECTOMY     CATARACT EXTRACTION     both eyes   CESAREAN SECTION  1962, 1963, 8035,8030   x 4   COLONOSCOPY  01/14/2017   DIAGNOSTIC LAPAROSCOPY     ECTOPIC PREGNANCY SURGERY  1966   FRACTURE SURGERY     HIP SURGERY Right    HYSTEROSCOPY WITH D & C N/A 11/02/2014   Procedure: DILATATION AND CURETTAGE /HYSTEROSCOPY;  Surgeon: Ezzie Buba, MD;  Location: [facility];  Service: Gynecology;  Laterality: N/A;   Lower Extremity Venous Dopplers  01/2013   No thrombus or thrombophlebitis. R Greater Saphenous Vein - enlarged without other insufficiency (6 mm); right and left Small Saphenous Vein: Patent no valve insufficiency.   TONSILLECTOMY     TONSILLECTOMY AND ADENOIDECTOMY     TRANSTHORACIC ECHOCARDIOGRAM  01/2013   Normal LV size and function. EF 55-60%. Grade 1 diastolic dysfunction. Elevated LV filling pressures. No regional W. May.   Family History  Problem Relation Age of Onset   Stroke Mother  10   Hypertension Mother    Colon cancer Mother 41   Hypertension Father    Stroke Father        2   Dementia Father    Dementia Sister    Hypertension Son 35       Started on BP medicine age [age]   Social History   Socioeconomic History   Marital status: Widowed    Spouse name: Not on file   Number of children: Not on file   Years of education: Not on file   Highest education level: Not on file  Occupational History   Not on file  Tobacco Use   Smoking status: Never   Smokeless tobacco: Never  Vaping Use   Vaping status: Never Used  Substance and Sexual Activity   Alcohol use: Yes    Alcohol/week: 7.0 standard drinks of alcohol    Types: 7  Standard drinks or equivalent per week    Comment: occasionally   Drug use: No   Sexual activity: Not on file  Other Topics Concern   Not on file  Social History Narrative   Tobaccco-None Never a tobacco user.   Alcohol use <1 drink per week.    Regular diet.    Patient does consume caffeine-Coffee and tea.   Marital status-Widow, married since 17.   Patient lives alone. No pets.    Past profession housewife, Lexicographer.   No exercise.    No DNR form and does not want to discuss one.    Patient has a HPOA          Social Drivers of Corporate investment banker Strain: Low Risk  (06/14/2024)   Overall Financial Resource Strain (CARDIA)    Difficulty of Paying Living Expenses: Not hard at all  Food Insecurity: No Food Insecurity (06/14/2024)   Hunger Vital Sign    Worried About Running Out of Food in the Last Year: Never true    Ran Out of Food in the Last Year: Never true  Transportation Needs: No Transportation Needs (06/14/2024)   PRAPARE - Administrator, Civil Service (Medical): No    Lack of Transportation (Non-Medical): No  Physical Activity: Insufficiently Active (06/14/2024)   Exercise Vital Sign    Days of Exercise per Week: 2 days    Minutes of Exercise per Session: 20 min  Stress: No  Stress Concern Present (06/14/2024)   [employer] of Occupational Health - Occupational Stress Questionnaire    Feeling of Stress: Not at all  Social Connections: Unknown (06/14/2024)   Social Connection and Isolation Panel    Frequency of Communication with Friends and Family: Patient unable to answer    Frequency of Social Gatherings with Friends and Family: Once a week    Attends Religious Services: Patient unable to answer    Active Member of Clubs or Organizations: Patient unable to answer    Attends Banker Meetings: Patient unable to answer    Marital Status: Widowed    Tobacco Counseling Counseling given: Not Answered   Clinical Intake:  Pre-visit preparation completed: Yes  Pain : No/denies pain     BMI - recorded: 22.39 Nutritional Status: BMI of 19-24  Normal Nutritional Risks: None Diabetes: No  How often do you need to have someone help you when you read instructions, pamphlets, or other written materials from your doctor or pharmacy?: 5 - Always What is the last grade level you completed in school?: college  Interpreter Needed?: No      Activities of Daily Living    06/14/2024    1:44 PM  In your present state of health, do you have any difficulty performing the following activities:  Hearing? 0  Vision? 0  Difficulty concentrating or making decisions? 1  Walking or climbing stairs? 1  Dressing or bathing? 1  Doing errands, shopping? 1  Preparing Food and eating ? Y  Using the Toilet? Y  In the past six months, have you accidently leaked urine? Y  Do you have problems with loss of bowel control? Y  Managing your Medications? Y  Managing your Finances? Y  Housekeeping or managing your Housekeeping? Y    Patient Care Team: Charlanne Fredia CROME, MD as PCP - General (Internal Medicine) [facility], [facility] (Ophthalmology) Dermatology, [facility] (Dermatology)  Indicate any recent Medical Services you may have received from other than  [facility] providers in the past year (date may be approximate).     Assessment:   This is a routine wellness examination for Janelly.  Hearing/Vision screen No results found.   Goals Addressed             This Visit's Progress    Maintain Mobility and Function   Not on track    Evidence-based guidance:  Emphasize the importance of physical activity and aerobic exercise as included in treatment plan; assess barriers to adherence; consider patient's abilities and preferences.  Encourage gradual increase in activity or exercise instead of stopping if pain occurs.  Reinforce individual therapy exercise prescription, such as strengthening, stabilization and stretching programs.  Promote optimal body mechanics to stabilize the spine with lifting and functional activity.  Encourage activity and mobility modifications to facilitate optimal function, such as using a log roll for bed mobility or dressing from a seated position.  Reinforce individual adaptive equipment recommendations to limit excessive spinal movements, such as a Event organiser.  Assess adequacy of sleep; encourage use of sleep hygiene techniques, such as bedtime routine; use of white noise; dark, cool bedroom; avoiding daytime naps, heavy meals or exercise before bedtime.  Promote positions and modification to optimize sleep and sexual activity; consider pillows or positioning devices to assist in maintaining neutral spine.  Explore options for applying ergonomic principles at work and home, such as frequent position changes, using ergonomically designed equipment and working at optimal height.  Promote modifications to increase comfort with driving such as lumbar support, optimizing seat and steering wheel position, using cruise control and taking frequent rest stops to stretch and walk.   Notes:        Depression Screen    06/14/2024    1:46 PM 06/14/2024    1:45 PM 06/09/2024    3:49 PM 06/02/2024   12:48 PM 05/24/2024     3:41 PM 04/21/2024    2:21 PM 02/25/2024    2:23 PM  PHQ 2/9 Scores  PHQ - 2 Score  0       Exception Documentation Medical reason  Medical reason Medical reason Medical reason Medical reason Medical reason    Fall Risk    06/14/2024    1:46 PM 06/09/2024    3:49 PM 06/02/2024   12:48 PM 05/24/2024    3:41 PM 04/21/2024    2:21 PM  Fall Risk   Falls in the past year? 1 1 1 1 1   Number falls in past yr: 1 1 1 1 1   Injury with Fall? 0 0 0 0 0  Risk for fall due to : History of fall(s);Impaired balance/gait History of fall(s);Impaired balance/gait History of fall(s);Impaired balance/gait History of fall(s);Impaired balance/gait History of fall(s);Impaired balance/gait;Impaired mobility  Follow up Falls evaluation completed;Education provided Falls evaluation completed Falls evaluation completed;Education provided Falls evaluation completed Falls evaluation completed;Education provided    MEDICARE RISK AT HOME: Medicare Risk at Home Any stairs in or around the home?: No If so, are there any without handrails?: No Home free of loose throw rugs in walkways, pet beds, electrical cords, etc?: Yes Adequate lighting in your home to reduce risk of falls?: Yes Life alert?: No Use of a cane, walker or w/c?: Yes Grab bars in the bathroom?: Yes Shower chair or bench in shower?: Yes Elevated toilet seat or a handicapped toilet?: Yes  TIMED UP AND GO:  Was the test performed?  No    Cognitive Function:    05/28/2023   12:42 PM  05/22/2022    3:00 PM 05/14/2021    1:42 PM 07/25/2020    1:26 PM 01/22/2020    1:39 PM  MMSE - Mini Mental State Exam  Not completed: Unable to complete Refused Unable to complete    Orientation to time    2 1  Orientation to Place    5 5  Registration    3 3  Attention/ Calculation    3 1  Recall    0 0  Language- name 2 objects    2 2  Language- repeat    1 1  Language- follow 3 step command    3 3  Language- read & follow direction    1 1  Write a sentence     1 1  Copy design    1 1  Copy design-comments     10 animals  Total score    22 19        05/22/2022    3:01 PM 05/14/2021    1:42 PM  6CIT Screen  What Year? 4 points 4 points  What month? 0 points 3 points  What time? 3 points 3 points  Count back from 20 2 points 2 points  Months in reverse 2 points 2 points  Repeat phrase 6 points 6 points  Total Score 17 points 20 points    Immunizations Immunization History  Administered Date(s) Administered   Fluad Quad(high Dose 65+) 07/08/2022   INFLUENZA, HIGH DOSE SEASONAL PF 07/13/2023   Influenza, Quadrivalent, Recombinant, Inj, Pf 06/18/2018, 05/04/2019, 06/15/2020   Influenza-Unspecified 09/15/2019, 07/08/2021, 07/08/2023   Moderna Covid-19 Fall Seasonal Vaccine 48yrs & older 01/05/2024   Moderna Covid-19 Vaccine  Bivalent Booster 43yrs & up 07/21/2023   Moderna Sars-Covid-2 Vaccination 09/15/2019   PFIZER(Purple Top)SARS-COV-2 Vaccination 10/03/2019, 10/22/2019, 09/03/2020   PPD Test 10/09/2020   Pfizer Covid-19 Vaccine Bivalent Booster 18yrs & up 06/04/2021   Pneumococcal Conjugate-13 05/28/2014   Pneumococcal Polysaccharide-23 07/15/1998, 06/15/2005   RSV,unspecified 08/29/2022   Tdap 05/26/2016   Unspecified SARS-COV-2 Vaccination 03/03/2022   Zoster, Live 05/18/2013, 05/28/2014    TDAP status: Up to date  Flu Vaccine status: Due, Education has been provided regarding the importance of this vaccine. Advised may receive this vaccine at local pharmacy or Health Dept. Aware to provide a copy of the vaccination record if obtained from local pharmacy or Health Dept. Verbalized acceptance and understanding.  Pneumococcal vaccine status: Due, Education has been provided regarding the importance of this vaccine. Advised may receive this vaccine at local pharmacy or Health Dept. Aware to provide a copy of the vaccination record if obtained from local pharmacy or Health Dept. Verbalized acceptance and understanding.  Covid-19  vaccine status: Completed vaccines  Qualifies for Shingles Vaccine? Yes   Zostavax completed Yes   Shingrix Completed?: No.    Education has been provided regarding the importance of this vaccine. Patient has been advised to call insurance company to determine out of pocket expense if they have not yet received this vaccine. Advised may also receive vaccine at local pharmacy or Health Dept. Verbalized acceptance and understanding.  Screening Tests Health Maintenance  Topic Date Due   Zoster Vaccines- Shingrix (1 of 2) 03/07/1983   Influenza Vaccine  04/14/2024   COVID-19 Vaccine (9 - 2024-25 season) 07/06/2024   Medicare Annual Wellness (AWV)  06/14/2025   DTaP/Tdap/Td (2 - Td or Tdap) 05/26/2026   Pneumococcal Vaccine: 50+ Years  Completed   HPV VACCINES  Aged Out   Meningococcal B  Vaccine  Aged Out   DEXA SCAN  Discontinued    Health Maintenance  Health Maintenance Due  Topic Date Due   Zoster Vaccines- Shingrix (1 of 2) 03/07/1983   Influenza Vaccine  04/14/2024    Colorectal cancer screening: No longer required.   Mammogram status: No longer required due to advanced age.  Bone Density status: Completed 1999. Results reflect: Bone density results: OSTEOPOROSIS. Repeat every no future studies> nonambulatory years.  Lung Cancer Screening: (Low Dose CT Chest recommended if Age 61-80 years, 20 pack-year currently smoking OR have quit w/in 15years.) does not qualify.   Lung Cancer Screening Referral: No  Additional Screening:  Hepatitis C Screening: does not qualify; Completed No  Vision Screening: Recommended annual ophthalmology exams for early detection of glaucoma and other disorders of the eye. Is the patient up to date with their annual eye exam?  Yes  Who is the provider or what is the name of the office in which the patient attends annual eye exams? [facility] If pt is not established with a provider, would they like to be referred to a provider to establish  care? No .   Dental Screening: Recommended annual dental exams for proper oral hygiene  Diabetic Foot Exam: Diabetic Foot Exam: Completed 06/14/2024  Community Resource Referral / Chronic Care Management: CRR required this visit?  No   CCM required this visit?  No     Plan:     I have personally reviewed and noted the following in the patient's chart:   Medical and social history Use of alcohol, tobacco or illicit drugs  Current medications and supplements including opioid prescriptions. Patient is not currently taking opioid prescriptions. Functional ability and status Nutritional status Physical activity Advanced directives List of other physicians Hospitalizations, surgeries, and ER visits in previous 12 months Vitals Screenings to include cognitive, depression, and falls Referrals and appointments  In addition, I have reviewed and discussed with patient certain preventive protocols, quality metrics, and best practice recommendations. A written personalized care plan for preventive services as well as general preventive health recommendations were provided to patient.     Greig FORBES Cluster, NP   06/14/2024   After Visit Summary: (MyChart) Due to this being a telephonic visit, the after visit summary with patients personalized plan was offered to patient via MyChart   Nurse Notes: Recently moved to [facility] 08/01 from [place]. Nonambulatory. Facility to offer flu and covid vaccines later this month. Recommend Prevnar 20. No future mammograms or DEXA due to goals of care/ advanced age.

## 2024-06-15 ENCOUNTER — Non-Acute Institutional Stay (SKILLED_NURSING_FACILITY): Payer: Self-pay | Admitting: Internal Medicine

## 2024-06-15 DIAGNOSIS — F32A Depression, unspecified: Secondary | ICD-10-CM

## 2024-06-15 DIAGNOSIS — M6281 Muscle weakness (generalized): Secondary | ICD-10-CM | POA: Diagnosis not present

## 2024-06-15 DIAGNOSIS — I10 Essential (primary) hypertension: Secondary | ICD-10-CM | POA: Diagnosis not present

## 2024-06-15 DIAGNOSIS — F320 Major depressive disorder, single episode, mild: Secondary | ICD-10-CM

## 2024-06-15 DIAGNOSIS — F03918 Unspecified dementia, unspecified severity, with other behavioral disturbance: Secondary | ICD-10-CM

## 2024-06-15 DIAGNOSIS — R634 Abnormal weight loss: Secondary | ICD-10-CM

## 2024-06-15 DIAGNOSIS — G309 Alzheimer's disease, unspecified: Secondary | ICD-10-CM | POA: Diagnosis not present

## 2024-06-15 DIAGNOSIS — Z23 Encounter for immunization: Secondary | ICD-10-CM | POA: Diagnosis not present

## 2024-06-15 DIAGNOSIS — E119 Type 2 diabetes mellitus without complications: Secondary | ICD-10-CM | POA: Diagnosis not present

## 2024-06-15 DIAGNOSIS — M25551 Pain in right hip: Secondary | ICD-10-CM | POA: Diagnosis not present

## 2024-06-15 DIAGNOSIS — F0150 Vascular dementia without behavioral disturbance: Secondary | ICD-10-CM

## 2024-06-15 DIAGNOSIS — Z9181 History of falling: Secondary | ICD-10-CM | POA: Diagnosis not present

## 2024-06-15 DIAGNOSIS — R2681 Unsteadiness on feet: Secondary | ICD-10-CM | POA: Diagnosis not present

## 2024-06-15 DIAGNOSIS — R278 Other lack of coordination: Secondary | ICD-10-CM | POA: Diagnosis not present

## 2024-06-15 DIAGNOSIS — R41841 Cognitive communication deficit: Secondary | ICD-10-CM | POA: Diagnosis not present

## 2024-06-16 DIAGNOSIS — Z9181 History of falling: Secondary | ICD-10-CM | POA: Diagnosis not present

## 2024-06-16 DIAGNOSIS — M6281 Muscle weakness (generalized): Secondary | ICD-10-CM | POA: Diagnosis not present

## 2024-06-16 DIAGNOSIS — R2681 Unsteadiness on feet: Secondary | ICD-10-CM | POA: Diagnosis not present

## 2024-06-16 DIAGNOSIS — R41841 Cognitive communication deficit: Secondary | ICD-10-CM | POA: Diagnosis not present

## 2024-06-16 DIAGNOSIS — M25551 Pain in right hip: Secondary | ICD-10-CM | POA: Diagnosis not present

## 2024-06-16 DIAGNOSIS — R278 Other lack of coordination: Secondary | ICD-10-CM | POA: Diagnosis not present

## 2024-06-16 NOTE — Progress Notes (Signed)
 Location: [facility] Service:  SNF (31)  Provider:   Code Status: DNR  Goals of Care:     06/02/2024   11:11 AM  Advanced Directives  Does Patient Have a Medical Advance Directive? Yes  Type of Estate agent of [place];Living will;Out of facility DNR (pink MOST or yellow form)  Does patient want to make changes to medical advance directive? No - Patient declined  Copy of Healthcare Power of Attorney in Chart? Yes - validated most recent copy scanned in chart (See row information)     Chief Complaint  Patient presents with   Acute Visit    HPI: Patient is a 89 y.o. female seen today for an acute visit for Behaviors  [place]  Patient was progressively getting less ambulatory due to her Arthritis and Cognition  Since her move she has depressed.  The nurses and the staff patient gets very confused in the evening starts crying.  Starts looking for her husband t thinks about leaving the facility. I went to see the patient and she did not have any acute complaints she did acknowledge that she feels depressed She has lost some weight also Wt Readings from Last 3 Encounters:  06/15/24 137 lb (62.1 kg)  06/14/24 138 lb 11.2 oz (62.9 kg)  06/09/24 148 lb 9.6 oz (67.4 kg)    Past Medical History:  Diagnosis Date   Anxiety    Cataract    Chest pain 02/16/2010   Myoview, negative for pharmacologic-stress induced ischemia, EF 74   Complication of anesthesia 1954   hard to wake up   Diverticulosis    DUB (dysfunctional uterine bleeding)    Dysrhythmia    h/o PVCs- none recently- no meds   Fall    GERD (gastroesophageal reflux disease)    Hearing reduced    Hypertension    no meds currently   Loss of consciousness (HCC)    Memory loss    Osteoarthritis of left hip    Osteoporosis    Palpitation 02/02/2013   A CardioNet MCOT (mobile cardiac outpatient telemetry ): NSR with PACs; sinus tachycardia  with ventricular trigeminy. Brief atrial run. Facet cardiac; ventricular trigeminy and accelerated idioventricular rhythm. No significant tachyarrhythmias.   Palpitations    Postmenopausal bleeding 11/01/2014   Unsteady gait    Varicose veins of both legs with pain     Past Surgical History:  Procedure Laterality Date   APPENDECTOMY     CATARACT EXTRACTION     both eyes   CESAREAN SECTION  1962, 1963, 8035,8030   x 4   COLONOSCOPY  01/14/2017   DIAGNOSTIC LAPAROSCOPY     ECTOPIC PREGNANCY SURGERY  1966   FRACTURE SURGERY     HIP SURGERY Right    HYSTEROSCOPY WITH D & C N/A 11/02/2014   Procedure: DILATATION AND CURETTAGE /HYSTEROSCOPY;  Surgeon: Ezzie Buba, MD;  Location: [facility];  Service: Gynecology;  Laterality: N/A;   Lower Extremity Venous Dopplers  01/2013   No thrombus or thrombophlebitis. R Greater Saphenous Vein - enlarged without other insufficiency (6 mm); right and left Small Saphenous Vein: Patent no valve insufficiency.   TONSILLECTOMY     TONSILLECTOMY AND ADENOIDECTOMY     TRANSTHORACIC ECHOCARDIOGRAM  01/2013   Normal LV size and function. EF 55-60%. Grade 1 diastolic dysfunction. Elevated LV filling pressures. No regional W. May.    Allergies  Allergen Reactions   Evista [  Raloxifene]    Fosamax [Alendronate]    Penicillins    Polyoxyethylene Lauryl Ether [Sorbitan]    Moxifloxacin Rash    Outpatient Encounter Medications as of 06/15/2024  Medication Sig   acetaminophen  (TYLENOL ) 500 MG tablet Take 2 tablets (1,000 mg total) by mouth 3 (three) times daily as needed.   acetaminophen  (TYLENOL ) 500 MG tablet Take 1,000 mg by mouth as needed.   Calcium Carbonate (CALCIUM 600 PO) Take 1 tablet by mouth daily.   divalproex  (DEPAKOTE  SPRINKLE) 125 MG capsule Take 1 capsule (125 mg total) by mouth daily.   Emollient (CERAVE) LOTN Apply 1 application. topically daily.   fluticasone (FLONASE) 50 MCG/ACT nasal spray Place 1 spray into both nostrils daily.    memantine  (NAMENDA ) 10 MG tablet Take 10 mg by mouth 2 (two) times daily.   MULTIPLE VITAMIN PO Take by mouth daily.   polyethylene glycol powder (GLYCOLAX/MIRALAX) 17 GM/SCOOP powder Take 17 g by mouth every other day. Dissolve 1 capful (17g) in 4-8 ounces of liquid and take by mouth daily.   potassium chloride (KLOR-CON) 10 MEQ tablet Take 10 mEq by mouth daily.   potassium chloride SA (KLOR-CON M) 20 MEQ tablet Take 20 mEq by mouth daily.   sertraline  (ZOLOFT ) 25 MG tablet Take 2 tablets (50 mg total) by mouth daily.   torsemide (DEMADEX) 20 MG tablet Take 40 mg by mouth daily.   traMADol  (ULTRAM ) 50 MG tablet Take 1 tablet (50 mg total) by mouth in the morning and at bedtime.   Zinc Oxide 10 % OINT Apply topically daily as needed.   [DISCONTINUED] sertraline  (ZOLOFT ) 25 MG tablet Take 1 tablet (25 mg total) by mouth daily.   No facility-administered encounter medications on file as of 06/15/2024.    Review of Systems:  Review of Systems  Unable to perform ROS: Dementia    Health Maintenance  Topic Date Due   Zoster Vaccines- Shingrix (1 of 2) 03/07/1983   Influenza Vaccine  04/14/2024   COVID-19 Vaccine (9 - 2024-25 season) 07/06/2024   Medicare Annual Wellness (AWV)  06/14/2025   DTaP/Tdap/Td (2 - Td or Tdap) 05/26/2026   Pneumococcal Vaccine: 50+ Years  Completed   Meningococcal B Vaccine  Aged Out   DEXA SCAN  Discontinued    Physical Exam: Vitals:   06/15/24 1554  BP: 109/73  Temp: 97.9 F (36.6 C)  Weight: 137 lb (62.1 kg)   Body mass index is 22.11 kg/m. Physical Exam Constitutional:      Appearance: Normal appearance.  HENT:     Head: Normocephalic.     Nose: Nose normal.     Mouth/Throat:     Mouth: Mucous membranes are moist.  Eyes:     Pupils: Pupils are equal, round, and reactive to light.  Musculoskeletal:     Cervical back: Neck supple.  Skin:    General: Skin is warm.  Neurological:     General: No focal deficit present.     Mental Status: She  is alert.     Labs reviewed: Basic Metabolic Panel: Recent Labs    08/09/23 0000 12/17/23 0000  NA 140 140  K 4.4 4.3  CL 107 108  CO2 25* 25*  BUN 34* 37*  CREATININE 1.4* 1.3*  CALCIUM 9.1 8.9   Liver Function Tests: Recent Labs    08/09/23 0000 12/17/23 0000  AST 14 17  ALT 10 14  ALKPHOS 118 117  ALBUMIN 3.4* 3.2*   No results for input(s): LIPASE,  AMYLASE in the last 8760 hours. No results for input(s): AMMONIA in the last 8760 hours. CBC: Recent Labs    08/09/23 0000 12/17/23 0000 01/17/24 0000  WBC 4.6 5.0 4.2  NEUTROABS  --  2,315.00 1,768.00  HGB 10.4* 9.9* 11.5*  HCT 31* 30* 35*  PLT 231 249 293   Lipid Panel: No results for input(s): CHOL, HDL, LDLCALC, TRIG, CHOLHDL, LDLDIRECT in the last 8760 hours. No results found for: HGBA1C  Procedures since last visit: No results found.  Assessment/Plan 1. Acute depression (Primary) Change Zoloft  to 50 mg BMP was normal   2. Mixed Alzheimer and vascular dementia (HCC) On Namenda  Change depakote  to 125 mg BID Use vistaril 10 mg every day prn  3. Weight loss Noticed some weight loss Creat was normal few weeks ago     Labs/tests ordered:  * No order type specified * Next appt:  Visit date not found

## 2024-06-18 ENCOUNTER — Encounter: Payer: Self-pay | Admitting: Internal Medicine

## 2024-06-18 MED ORDER — DIVALPROEX SODIUM 125 MG PO CSDR: 125.0000 mg | DELAYED_RELEASE_CAPSULE | Freq: Two times a day (BID) | ORAL | Status: AC

## 2024-06-18 MED ORDER — HYDROXYZINE HCL 10 MG PO TABS: 10.0000 mg | ORAL_TABLET | Freq: Every day | ORAL | Status: DC

## 2024-06-18 MED ORDER — SERTRALINE HCL 25 MG PO TABS: 50.0000 mg | ORAL_TABLET | Freq: Every day | ORAL | Status: DC

## 2024-06-19 ENCOUNTER — Encounter: Payer: Self-pay | Admitting: Nurse Practitioner

## 2024-06-19 ENCOUNTER — Non-Acute Institutional Stay (SKILLED_NURSING_FACILITY): Payer: Self-pay | Admitting: Nurse Practitioner

## 2024-06-19 DIAGNOSIS — N1832 Chronic kidney disease, stage 3b: Secondary | ICD-10-CM

## 2024-06-19 DIAGNOSIS — K219 Gastro-esophageal reflux disease without esophagitis: Secondary | ICD-10-CM | POA: Diagnosis not present

## 2024-06-19 DIAGNOSIS — M6281 Muscle weakness (generalized): Secondary | ICD-10-CM | POA: Diagnosis not present

## 2024-06-19 DIAGNOSIS — R2681 Unsteadiness on feet: Secondary | ICD-10-CM | POA: Diagnosis not present

## 2024-06-19 DIAGNOSIS — F0390 Unspecified dementia without behavioral disturbance: Secondary | ICD-10-CM

## 2024-06-19 DIAGNOSIS — H1012 Acute atopic conjunctivitis, left eye: Secondary | ICD-10-CM

## 2024-06-19 DIAGNOSIS — M150 Primary generalized (osteo)arthritis: Secondary | ICD-10-CM | POA: Diagnosis not present

## 2024-06-19 DIAGNOSIS — H109 Unspecified conjunctivitis: Secondary | ICD-10-CM | POA: Insufficient documentation

## 2024-06-19 DIAGNOSIS — R278 Other lack of coordination: Secondary | ICD-10-CM | POA: Diagnosis not present

## 2024-06-19 DIAGNOSIS — I10 Essential (primary) hypertension: Secondary | ICD-10-CM | POA: Diagnosis not present

## 2024-06-19 DIAGNOSIS — E782 Mixed hyperlipidemia: Secondary | ICD-10-CM

## 2024-06-19 DIAGNOSIS — Z9181 History of falling: Secondary | ICD-10-CM | POA: Diagnosis not present

## 2024-06-19 DIAGNOSIS — M159 Polyosteoarthritis, unspecified: Secondary | ICD-10-CM | POA: Insufficient documentation

## 2024-06-19 DIAGNOSIS — I872 Venous insufficiency (chronic) (peripheral): Secondary | ICD-10-CM

## 2024-06-19 DIAGNOSIS — M25551 Pain in right hip: Secondary | ICD-10-CM | POA: Diagnosis not present

## 2024-06-19 DIAGNOSIS — R41841 Cognitive communication deficit: Secondary | ICD-10-CM | POA: Diagnosis not present

## 2024-06-19 DIAGNOSIS — N183 Chronic kidney disease, stage 3 unspecified: Secondary | ICD-10-CM | POA: Insufficient documentation

## 2024-06-19 NOTE — Assessment & Plan Note (Signed)
 BMP showed creat trended up from 1.3 to 1.78 06/15/24.  Weight lost about #10Ibs in the past month No apparent edema BLE Reported decreased appetite Decrease Torsemide 20mg /40mg  every day, Kcl 20meq/10meq daily, f/u BMP one wk Encourage oral fluid intake.

## 2024-06-19 NOTE — Assessment & Plan Note (Signed)
 No apparent edema BLE Reduce Torsemide 20mg /40mg  every day Observe.

## 2024-06-19 NOTE — Assessment & Plan Note (Signed)
 taking Tylenol , Tramadol .

## 2024-06-19 NOTE — Assessment & Plan Note (Signed)
 taking Namenda , TSH 1.78 06/15/24 Continue SNF [facility] for care needs.

## 2024-06-19 NOTE — Assessment & Plan Note (Signed)
Blood pressure is controlled, continue Torsemide.

## 2024-06-19 NOTE — Assessment & Plan Note (Signed)
 not taking acid reducer. Hgb 12.5 06/15/24, stable.

## 2024-06-19 NOTE — Assessment & Plan Note (Signed)
 Apply Naphcon ophthalmic sol I gtt OS bid x 3 days.

## 2024-06-19 NOTE — Assessment & Plan Note (Signed)
 Diet control

## 2024-06-19 NOTE — Progress Notes (Signed)
 Location:   SNF [facility] Room Number: 35 Place of Service:  SNF (31) Provider: [facility] Yazid Pop NP  Charlanne Fredia CROME, MD  Patient Care Team: Charlanne Fredia CROME, MD as PCP - General (Internal Medicine) Pa, [facility] (Ophthalmology) Dermatology, [facility] (Dermatology)  Extended Emergency Contact Information Primary Emergency Contact: Jones,Beth Address: [address]          [place], [place] [address] [place] Home Phone: [phone] Mobile Phone: [phone] Relation: Daughter  Code Status:  DNR Goals of care: Advanced Directive information    06/02/2024   11:11 AM  Advanced Directives  Does Patient Have a Medical Advance Directive? Yes  Type of Estate agent of [place];Living will;Out of facility DNR (pink MOST or yellow form)  Does patient want to make changes to medical advance directive? No - Patient declined  Copy of Healthcare Power of Attorney in Chart? Yes - validated most recent copy scanned in chart (See row information)     Chief Complaint  Patient presents with   Acute Visit    Weight loss, elevated creat    HPI:  Pt is a 89 y.o. female seen today for an acute visit for weight loss about #10Ibs, reported the patient oral intake is not optimal. BMP showed creat trended up from 1.3 to 1.78 06/15/24.   The patient c/o itching left eye, noted mild injected left eye, no change of vision of pain.   CKD Bun/creat 37/1.78 06/15/24  Dementia, taking Namenda , TSH 1.78 06/15/24  HTN, taking Torsemide  HLD, not take statin.   Venous insufficiency, taking Torsemide, Kcl.   GERD, not taking acid reducer. Hgb 12.5 06/15/24  OA, taking Tylenol , Tramadol .  Hx of diverticulitis  Depression, on Sertraline  since 06/02/24, Depakote  since 06/09/24, prn Hydroxyzine  Constipation, on MiraLax, Senokot   Past Medical History:  Diagnosis Date   Anxiety    Cataract    Chest pain 02/16/2010   Myoview, negative for pharmacologic-stress  induced ischemia, EF 74   Complication of anesthesia 1954   hard to wake up   Diverticulosis    DUB (dysfunctional uterine bleeding)    Dysrhythmia    h/o PVCs- none recently- no meds   Fall    GERD (gastroesophageal reflux disease)    Hearing reduced    Hypertension    no meds currently   Loss of consciousness (HCC)    Memory loss    Osteoarthritis of left hip    Osteoporosis    Palpitation 02/02/2013   A CardioNet MCOT (mobile cardiac outpatient telemetry ): NSR with PACs; sinus tachycardia with ventricular trigeminy. Brief atrial run. Facet cardiac; ventricular trigeminy and accelerated idioventricular rhythm. No significant tachyarrhythmias.   Palpitations    Postmenopausal bleeding 11/01/2014   Unsteady gait    Varicose veins of both legs with pain    Past Surgical History:  Procedure Laterality Date   APPENDECTOMY     CATARACT EXTRACTION     both eyes   CESAREAN SECTION  1962, 1963, 8035,8030   x 4   COLONOSCOPY  01/14/2017   DIAGNOSTIC LAPAROSCOPY     ECTOPIC PREGNANCY SURGERY  1966   FRACTURE SURGERY     HIP SURGERY Right    HYSTEROSCOPY WITH D & C N/A 11/02/2014   Procedure: DILATATION AND CURETTAGE /HYSTEROSCOPY;  Surgeon: Ezzie Buba, MD;  Location: [facility];  Service: Gynecology;  Laterality: N/A;   Lower Extremity Venous Dopplers  01/2013   No thrombus or thrombophlebitis. R  Greater Saphenous Vein - enlarged without other insufficiency (6 mm); right and left Small Saphenous Vein: Patent no valve insufficiency.   TONSILLECTOMY     TONSILLECTOMY AND ADENOIDECTOMY     TRANSTHORACIC ECHOCARDIOGRAM  01/2013   Normal LV size and function. EF 55-60%. Grade 1 diastolic dysfunction. Elevated LV filling pressures. No regional W. May.    Allergies  Allergen Reactions   Evista [Raloxifene]    Fosamax [Alendronate]    Penicillins    Polyoxyethylene Lauryl Ether [Sorbitan]    Moxifloxacin Rash    Allergies as of 06/19/2024       Reactions   Evista  [raloxifene]    Fosamax [alendronate]    Penicillins    Polyoxyethylene Lauryl Ether [sorbitan]    Moxifloxacin Rash        Medication List        Accurate as of June 19, 2024 12:49 PM. If you have any questions, ask your nurse or doctor.          acetaminophen  500 MG tablet Commonly known as: TYLENOL  Take 1,000 mg by mouth as needed.   acetaminophen  500 MG tablet Commonly known as: TYLENOL  Take 2 tablets (1,000 mg total) by mouth 3 (three) times daily as needed.   CALCIUM 600 PO Take 1 tablet by mouth daily.   CeraVe Lotn Apply 1 application. topically daily.   divalproex  125 MG capsule Commonly known as: DEPAKOTE  SPRINKLE Take 1 capsule (125 mg total) by mouth 2 (two) times daily.   fluticasone 50 MCG/ACT nasal spray Commonly known as: FLONASE Place 1 spray into both nostrils daily.   hydrOXYzine 10 MG tablet Commonly known as: ATARAX Take 1 tablet (10 mg total) by mouth daily.   memantine  10 MG tablet Commonly known as: NAMENDA  Take 10 mg by mouth 2 (two) times daily.   MULTIPLE VITAMIN PO Take by mouth daily.   polyethylene glycol powder 17 GM/SCOOP powder Commonly known as: GLYCOLAX/MIRALAX Take 17 g by mouth every other day. Dissolve 1 capful (17g) in 4-8 ounces of liquid and take by mouth daily.   potassium chloride 10 MEQ tablet Commonly known as: KLOR-CON Take 10 mEq by mouth daily.   potassium chloride SA 20 MEQ tablet Commonly known as: KLOR-CON M Take 20 mEq by mouth daily.   sertraline  25 MG tablet Commonly known as: ZOLOFT  Take 2 tablets (50 mg total) by mouth daily.   torsemide 20 MG tablet Commonly known as: DEMADEX Take 40 mg by mouth daily.   traMADol  50 MG tablet Commonly known as: ULTRAM  Take 1 tablet (50 mg total) by mouth in the morning and at bedtime.   Zinc Oxide 10 % Oint Apply topically daily as needed.       ROS was provided with assistance of staff.  Review of Systems  Constitutional:  Positive for  appetite change and fatigue. Negative for activity change.  HENT:  Positive for hearing loss. Negative for congestion and trouble swallowing.   Eyes:  Positive for redness and itching. Negative for visual disturbance.  Respiratory:  Negative for cough and shortness of breath.   Cardiovascular:  Negative for leg swelling.  Gastrointestinal:  Negative for abdominal pain, constipation and nausea.  Genitourinary:  Negative for dysuria and urgency.  Musculoskeletal:  Positive for gait problem.  Skin:  Negative for color change.  Neurological:  Negative for speech difficulty, weakness and headaches.       Dementia  Psychiatric/Behavioral:  Positive for behavioral problems and confusion. Negative for sleep disturbance. The  patient is not nervous/anxious.        Reported restlessness     Immunization History  Administered Date(s) Administered   Fluad Quad(high Dose 65+) 07/08/2022   INFLUENZA, HIGH DOSE SEASONAL PF 07/13/2023   Influenza, Quadrivalent, Recombinant, Inj, Pf 06/18/2018, 05/04/2019, 06/15/2020   Influenza-Unspecified 09/15/2019, 07/08/2021, 07/08/2023   Moderna Covid-19 Fall Seasonal Vaccine 28yrs & older 01/05/2024   Moderna Covid-19 Vaccine  Bivalent Booster 62yrs & up 07/21/2023   Moderna Sars-Covid-2 Vaccination 09/15/2019   PFIZER(Purple Top)SARS-COV-2 Vaccination 10/03/2019, 10/22/2019, 09/03/2020   PPD Test 10/09/2020   Pfizer Covid-19 Vaccine Bivalent Booster 16yrs & up 06/04/2021   Pneumococcal Conjugate-13 05/28/2014   Pneumococcal Polysaccharide-23 07/15/1998, 06/15/2005   RSV,unspecified 08/29/2022   Tdap 05/26/2016   Unspecified SARS-COV-2 Vaccination 03/03/2022   Zoster, Live 05/18/2013, 05/28/2014   Pertinent  Health Maintenance Due  Topic Date Due   Influenza Vaccine  04/14/2024   DEXA SCAN  Discontinued      04/21/2024    2:21 PM 05/24/2024    3:41 PM 06/02/2024   12:48 PM 06/09/2024    3:49 PM 06/14/2024    1:46 PM  Fall Risk  Falls in the past year? 1  1 1 1 1   Was there an injury with Fall? 0 0 0 0 0  Fall Risk Category Calculator 2 2 2 2 2   Patient at Risk for Falls Due to History of fall(s);Impaired balance/gait;Impaired mobility History of fall(s);Impaired balance/gait History of fall(s);Impaired balance/gait History of fall(s);Impaired balance/gait History of fall(s);Impaired balance/gait  Fall risk Follow up Falls evaluation completed;Education provided Falls evaluation completed Falls evaluation completed;Education provided Falls evaluation completed Falls evaluation completed;Education provided   Functional Status Survey:    Vitals:   06/19/24 1216  BP: 109/73  Pulse: 81  Resp: 17  Temp: 97.9 F (36.6 C)  SpO2: 100%  Weight: 137 lb 11.2 oz (62.5 kg)   Body mass index is 22.23 kg/m. Physical Exam Vitals and nursing note reviewed.  Constitutional:      Comments: Appears tired.   HENT:     Head: Normocephalic and atraumatic.     Nose: Nose normal.     Mouth/Throat:     Mouth: Mucous membranes are moist.     Comments: Mild erythema in throat.  Eyes:     General:        Left eye: Discharge present.    Extraocular Movements: Extraocular movements intact.     Pupils: Pupils are equal, round, and reactive to light.     Comments: Mild injected left eye  Cardiovascular:     Rate and Rhythm: Normal rate and regular rhythm.     Heart sounds: No murmur heard. Pulmonary:     Effort: Pulmonary effort is normal.     Breath sounds: No rales.  Abdominal:     General: Bowel sounds are normal.     Palpations: Abdomen is soft.     Tenderness: There is no abdominal tenderness.  Musculoskeletal:     Cervical back: Normal range of motion and neck supple.     Right lower leg: No edema.     Left lower leg: No edema.  Skin:    General: Skin is warm and dry.  Neurological:     General: No focal deficit present.     Mental Status: She is alert. Mental status is at baseline.     Motor: No weakness.     Coordination: Coordination  normal.     Gait: Gait abnormal.  Comments: Oriented to person  Psychiatric:     Comments: Episodes of restlessness, attempts of packing and leaving     Labs reviewed: Recent Labs    08/09/23 0000 12/17/23 0000  NA 140 140  K 4.4 4.3  CL 107 108  CO2 25* 25*  BUN 34* 37*  CREATININE 1.4* 1.3*  CALCIUM 9.1 8.9   Recent Labs    08/09/23 0000 12/17/23 0000  AST 14 17  ALT 10 14  ALKPHOS 118 117  ALBUMIN 3.4* 3.2*   Recent Labs    08/09/23 0000 12/17/23 0000 01/17/24 0000  WBC 4.6 5.0 4.2  NEUTROABS  --  2,315.00 1,768.00  HGB 10.4* 9.9* 11.5*  HCT 31* 30* 35*  PLT 231 249 293   Lab Results  Component Value Date   TSH 4.09 10/21/2021   No results found for: HGBA1C Lab Results  Component Value Date   CHOL 156 08/27/2020   HDL 69 08/27/2020   LDLCALC 75 08/27/2020   TRIG 62 08/27/2020   CHOLHDL 2.8 02/15/2010    Significant Diagnostic Results in last 30 days:  No results found.  Assessment/Plan CKD (chronic kidney disease) stage 3, GFR 30-59 ml/min (HCC) BMP showed creat trended up from 1.3 to 1.78 06/15/24.  Weight lost about #10Ibs in the past month No apparent edema BLE Reported decreased appetite Decrease Torsemide 20mg /40mg  every day, Kcl 20meq/10meq daily, f/u BMP one wk Encourage oral fluid intake.   Conjunctivitis Apply Naphcon ophthalmic sol I gtt OS bid x 3 days.   Dementia without behavioral disturbance (HCC) taking Namenda , TSH 1.78 06/15/24 Continue SNF [facility] for care needs.   Essential hypertension - labile Blood pressure is controlled, continue Torsemide.   HLD (hyperlipidemia) Diet control.   Venous insufficiency - painful varicose veins, restless legs, heaviness No apparent edema BLE Reduce Torsemide 20mg /40mg  every day Observe.   GERD (gastroesophageal reflux disease) not taking acid reducer. Hgb 12.5 06/15/24, stable.   Osteoarthritis, multiple sites taking Tylenol , Tramadol .    Family/ staff Communication: Plan  of care reviewed with the patient and charge nurse  Labs/tests ordered: BMP 1 week

## 2024-06-21 DIAGNOSIS — Z9181 History of falling: Secondary | ICD-10-CM | POA: Diagnosis not present

## 2024-06-21 DIAGNOSIS — R2681 Unsteadiness on feet: Secondary | ICD-10-CM | POA: Diagnosis not present

## 2024-06-21 DIAGNOSIS — R41841 Cognitive communication deficit: Secondary | ICD-10-CM | POA: Diagnosis not present

## 2024-06-21 DIAGNOSIS — R278 Other lack of coordination: Secondary | ICD-10-CM | POA: Diagnosis not present

## 2024-06-21 DIAGNOSIS — M6281 Muscle weakness (generalized): Secondary | ICD-10-CM | POA: Diagnosis not present

## 2024-06-21 DIAGNOSIS — M25551 Pain in right hip: Secondary | ICD-10-CM | POA: Diagnosis not present

## 2024-06-22 DIAGNOSIS — R41841 Cognitive communication deficit: Secondary | ICD-10-CM | POA: Diagnosis not present

## 2024-06-22 DIAGNOSIS — R2681 Unsteadiness on feet: Secondary | ICD-10-CM | POA: Diagnosis not present

## 2024-06-22 DIAGNOSIS — M6281 Muscle weakness (generalized): Secondary | ICD-10-CM | POA: Diagnosis not present

## 2024-06-22 DIAGNOSIS — R410 Disorientation, unspecified: Secondary | ICD-10-CM | POA: Diagnosis not present

## 2024-06-22 DIAGNOSIS — Z9181 History of falling: Secondary | ICD-10-CM | POA: Diagnosis not present

## 2024-06-22 DIAGNOSIS — M25551 Pain in right hip: Secondary | ICD-10-CM | POA: Diagnosis not present

## 2024-06-22 DIAGNOSIS — R278 Other lack of coordination: Secondary | ICD-10-CM | POA: Diagnosis not present

## 2024-06-23 DIAGNOSIS — M6281 Muscle weakness (generalized): Secondary | ICD-10-CM | POA: Diagnosis not present

## 2024-06-23 DIAGNOSIS — Z9181 History of falling: Secondary | ICD-10-CM | POA: Diagnosis not present

## 2024-06-23 DIAGNOSIS — R278 Other lack of coordination: Secondary | ICD-10-CM | POA: Diagnosis not present

## 2024-06-23 DIAGNOSIS — M25551 Pain in right hip: Secondary | ICD-10-CM | POA: Diagnosis not present

## 2024-06-23 DIAGNOSIS — R2681 Unsteadiness on feet: Secondary | ICD-10-CM | POA: Diagnosis not present

## 2024-06-23 DIAGNOSIS — R41841 Cognitive communication deficit: Secondary | ICD-10-CM | POA: Diagnosis not present

## 2024-06-26 ENCOUNTER — Encounter: Payer: Self-pay | Admitting: Orthopedic Surgery

## 2024-06-26 ENCOUNTER — Non-Acute Institutional Stay (SKILLED_NURSING_FACILITY): Payer: Self-pay | Admitting: Orthopedic Surgery

## 2024-06-26 DIAGNOSIS — M25551 Pain in right hip: Secondary | ICD-10-CM | POA: Diagnosis not present

## 2024-06-26 DIAGNOSIS — R2681 Unsteadiness on feet: Secondary | ICD-10-CM | POA: Diagnosis not present

## 2024-06-26 DIAGNOSIS — R634 Abnormal weight loss: Secondary | ICD-10-CM | POA: Diagnosis not present

## 2024-06-26 DIAGNOSIS — R278 Other lack of coordination: Secondary | ICD-10-CM | POA: Diagnosis not present

## 2024-06-26 DIAGNOSIS — F03918 Unspecified dementia, unspecified severity, with other behavioral disturbance: Secondary | ICD-10-CM

## 2024-06-26 DIAGNOSIS — M810 Age-related osteoporosis without current pathological fracture: Secondary | ICD-10-CM

## 2024-06-26 DIAGNOSIS — Z9181 History of falling: Secondary | ICD-10-CM | POA: Diagnosis not present

## 2024-06-26 DIAGNOSIS — N1832 Chronic kidney disease, stage 3b: Secondary | ICD-10-CM

## 2024-06-26 DIAGNOSIS — R41841 Cognitive communication deficit: Secondary | ICD-10-CM | POA: Diagnosis not present

## 2024-06-26 DIAGNOSIS — R600 Localized edema: Secondary | ICD-10-CM | POA: Diagnosis not present

## 2024-06-26 DIAGNOSIS — M150 Primary generalized (osteo)arthritis: Secondary | ICD-10-CM | POA: Diagnosis not present

## 2024-06-26 DIAGNOSIS — M6281 Muscle weakness (generalized): Secondary | ICD-10-CM | POA: Diagnosis not present

## 2024-06-26 NOTE — Progress Notes (Signed)
 Location:  [facility] Room Number: 35/A Place of Service:  SNF [phone]) Provider:  Greig FORBES Cluster, NP   Charlanne Fredia CROME, MD  Patient Care Team: Charlanne Fredia CROME, MD as PCP - General (Internal Medicine) Pa, [facility] (Ophthalmology) Dermatology, [facility] (Dermatology)  Extended Emergency Contact Information Primary Emergency Contact: Jones,Beth Address: [address]          [place], [place] [address] [place] Home Phone: [phone] Mobile Phone: [phone] Relation: Daughter  Code Status:  DNR Goals of care: Advanced Directive information    06/02/2024   11:11 AM  Advanced Directives  Does Patient Have a Medical Advance Directive? Yes  Type of Estate agent of [place];Living will;Out of facility DNR (pink MOST or yellow form)  Does patient want to make changes to medical advance directive? No - Patient declined  Copy of Healthcare Power of Attorney in Chart? Yes - validated most recent copy scanned in chart (See row information)     Chief Complaint  Patient presents with   Medical Management of Chronic Issues    HPI:  Pt is a 89 y.o. female seen today for medical management of chronic diseases.    She currently resides in the skilled nursing unit at [facility]. PMH: HTN, HLD, venous insufficiency, GERD, dementia, right hip surgery, and diverticulitis.    Weight loss- see trends below, now needing feeding assistance, magic cup and BOOst started  AD/Vascular dementia- MMSE 23/30 10/19/2023, was 24/30 (04/07/2023)> was 22/30 2022, recent BIMS 5/15 (08/13), MRI brain 2022 indicated mild age related cerebral atrophy with chronic small vessel disease, 09/19 increased crying/ looking for husband> zoloft  started, 09/26 Depakote  started due to increased confusion> 10/06 increased to BID, dependent with ADLs except feeding, remains on Namenda   OA/ chronic back pain- back, knees and hips most bothersome, now on  scheduled tramadol  Bilateral leg edema-  BUN/creat 36/1.66 06/06/2024, remains on torsemide (recently reduced 10/06) and KCL CKD- see above, GFR 29 (09/23)> was 38 (04/04) Osteoporosis- DEXA 1999, remains on calcium  Recent weights:  10/01- 138.7 lbs  09/02- 150.0 lbs  08/01- 150.2 lbs  Recent blood pressures:  10/07- 129/73  09/30- 109/73  09/23- 96/56   Past Medical History:  Diagnosis Date   Anxiety    Cataract    Chest pain 02/16/2010   Myoview, negative for pharmacologic-stress induced ischemia, EF 74   Complication of anesthesia 1954   hard to wake up   Diverticulosis    DUB (dysfunctional uterine bleeding)    Dysrhythmia    h/o PVCs- none recently- no meds   Fall    GERD (gastroesophageal reflux disease)    Hearing reduced    Hypertension    no meds currently   Loss of consciousness (HCC)    Memory loss    Osteoarthritis of left hip    Osteoporosis    Palpitation 02/02/2013   A CardioNet MCOT (mobile cardiac outpatient telemetry ): NSR with PACs; sinus tachycardia with ventricular trigeminy. Brief atrial run. Facet cardiac; ventricular trigeminy and accelerated idioventricular rhythm. No significant tachyarrhythmias.   Palpitations    Postmenopausal bleeding 11/01/2014   Unsteady gait    Varicose veins of both legs with pain    Past Surgical History:  Procedure Laterality Date   APPENDECTOMY     CATARACT EXTRACTION     both eyes   CESAREAN SECTION  1962, 1963, 8035,8030   x 4   COLONOSCOPY  01/14/2017  DIAGNOSTIC LAPAROSCOPY     ECTOPIC PREGNANCY SURGERY  1966   FRACTURE SURGERY     HIP SURGERY Right    HYSTEROSCOPY WITH D & C N/A 11/02/2014   Procedure: DILATATION AND CURETTAGE /HYSTEROSCOPY;  Surgeon: Ezzie Buba, MD;  Location: [facility];  Service: Gynecology;  Laterality: N/A;   Lower Extremity Venous Dopplers  01/2013   No thrombus or thrombophlebitis. R Greater Saphenous Vein - enlarged without other insufficiency (6 mm); right and left  Small Saphenous Vein: Patent no valve insufficiency.   TONSILLECTOMY     TONSILLECTOMY AND ADENOIDECTOMY     TRANSTHORACIC ECHOCARDIOGRAM  01/2013   Normal LV size and function. EF 55-60%. Grade 1 diastolic dysfunction. Elevated LV filling pressures. No regional W. May.    Allergies  Allergen Reactions   Evista [Raloxifene]    Fosamax [Alendronate]    Penicillins    Polyoxyethylene Lauryl Ether [Sorbitan]    Moxifloxacin Rash    Outpatient Encounter Medications as of 06/26/2024  Medication Sig   acetaminophen  (TYLENOL ) 500 MG tablet Take 2 tablets (1,000 mg total) by mouth 3 (three) times daily as needed.   acetaminophen  (TYLENOL ) 500 MG tablet Take 1,000 mg by mouth as needed.   Calcium Carbonate (CALCIUM 600 PO) Take 1 tablet by mouth daily.   divalproex  (DEPAKOTE  SPRINKLE) 125 MG capsule Take 1 capsule (125 mg total) by mouth 2 (two) times daily.   Emollient (CERAVE) LOTN Apply 1 application. topically daily.   fluticasone (FLONASE) 50 MCG/ACT nasal spray Place 1 spray into both nostrils daily.   hydrOXYzine (ATARAX) 10 MG tablet Take 1 tablet (10 mg total) by mouth daily.   memantine  (NAMENDA ) 10 MG tablet Take 10 mg by mouth 2 (two) times daily.   MULTIPLE VITAMIN PO Take by mouth daily.   polyethylene glycol powder (GLYCOLAX/MIRALAX) 17 GM/SCOOP powder Take 17 g by mouth every other day. Dissolve 1 capful (17g) in 4-8 ounces of liquid and take by mouth daily.   potassium chloride (KLOR-CON) 10 MEQ tablet Take 10 mEq by mouth daily.   potassium chloride SA (KLOR-CON M) 20 MEQ tablet Take 20 mEq by mouth daily.   sertraline  (ZOLOFT ) 25 MG tablet Take 2 tablets (50 mg total) by mouth daily.   torsemide (DEMADEX) 20 MG tablet Take 40 mg by mouth daily.   traMADol  (ULTRAM ) 50 MG tablet Take 1 tablet (50 mg total) by mouth in the morning and at bedtime.   Zinc Oxide 10 % OINT Apply topically daily as needed.   No facility-administered encounter medications on file as of 06/26/2024.     Review of Systems  Unable to perform ROS: Dementia    Immunization History  Administered Date(s) Administered   Fluad Quad(high Dose 65+) 07/08/2022   INFLUENZA, HIGH DOSE SEASONAL PF 07/13/2023   Influenza, Quadrivalent, Recombinant, Inj, Pf 06/18/2018, 05/04/2019, 06/15/2020   Influenza-Unspecified 09/15/2019, 07/08/2021, 07/08/2023   Moderna Covid-19 Fall Seasonal Vaccine 50yrs & older 01/05/2024   Moderna Covid-19 Vaccine  Bivalent Booster 44yrs & up 07/21/2023   Moderna Sars-Covid-2 Vaccination 09/15/2019   PFIZER(Purple Top)SARS-COV-2 Vaccination 10/03/2019, 10/22/2019, 09/03/2020   PPD Test 10/09/2020   Pfizer Covid-19 Vaccine Bivalent Booster 61yrs & up 06/04/2021   Pneumococcal Conjugate-13 05/28/2014   Pneumococcal Polysaccharide-23 07/15/1998, 06/15/2005   RSV,unspecified 08/29/2022   Tdap 05/26/2016   Unspecified SARS-COV-2 Vaccination 03/03/2022   Zoster, Live 05/18/2013, 05/28/2014   Pertinent  Health Maintenance Due  Topic Date Due   Influenza Vaccine  04/14/2024   DEXA SCAN  Discontinued  04/21/2024    2:21 PM 05/24/2024    3:41 PM 06/02/2024   12:48 PM 06/09/2024    3:49 PM 06/14/2024    1:46 PM  Fall Risk  Falls in the past year? 1 1 1 1 1   Was there an injury with Fall? 0 0 0 0 0  Fall Risk Category Calculator 2 2 2 2 2   Patient at Risk for Falls Due to History of fall(s);Impaired balance/gait;Impaired mobility History of fall(s);Impaired balance/gait History of fall(s);Impaired balance/gait History of fall(s);Impaired balance/gait History of fall(s);Impaired balance/gait  Fall risk Follow up Falls evaluation completed;Education provided Falls evaluation completed Falls evaluation completed;Education provided Falls evaluation completed Falls evaluation completed;Education provided   Functional Status Survey:    Vitals:   06/26/24 1302  BP: 129/73  Pulse: 85  Resp: 18  Temp: 97.8 F (36.6 C)  SpO2: 99%  Weight: 138 lb 12.8 oz (63 kg)  Height:  5' 6 (1.676 m)   Body mass index is 22.4 kg/m. Physical Exam Vitals reviewed.  Constitutional:      General: She is not in acute distress. HENT:     Head: Normocephalic.  Eyes:     General:        Right eye: No discharge.        Left eye: No discharge.  Cardiovascular:     Rate and Rhythm: Normal rate and regular rhythm.     Pulses: Normal pulses.     Heart sounds: Normal heart sounds.  Pulmonary:     Effort: Pulmonary effort is normal.     Breath sounds: Normal breath sounds.  Abdominal:     General: Bowel sounds are normal. There is no distension.     Palpations: Abdomen is soft.     Tenderness: There is no abdominal tenderness.  Musculoskeletal:     Cervical back: Neck supple.     Right lower leg: Edema present.     Left lower leg: Edema present.     Comments: Non pitting  Skin:    General: Skin is warm.     Capillary Refill: Capillary refill takes less than 2 seconds.  Neurological:     General: No focal deficit present.     Mental Status: She is alert. Mental status is at baseline.     Motor: Weakness present.     Gait: Gait abnormal.  Psychiatric:     Comments: Alert to self, mild aphasia, follows some commands     Labs reviewed: Recent Labs    08/09/23 0000 12/17/23 0000  NA 140 140  K 4.4 4.3  CL 107 108  CO2 25* 25*  BUN 34* 37*  CREATININE 1.4* 1.3*  CALCIUM 9.1 8.9   Recent Labs    08/09/23 0000 12/17/23 0000  AST 14 17  ALT 10 14  ALKPHOS 118 117  ALBUMIN 3.4* 3.2*   Recent Labs    08/09/23 0000 12/17/23 0000 01/17/24 0000  WBC 4.6 5.0 4.2  NEUTROABS  --  2,315.00 1,768.00  HGB 10.4* 9.9* 11.5*  HCT 31* 30* 35*  PLT 231 249 293   Lab Results  Component Value Date   TSH 4.09 10/21/2021   No results found for: HGBA1C Lab Results  Component Value Date   CHOL 156 08/27/2020   HDL 69 08/27/2020   LDLCALC 75 08/27/2020   TRIG 62 08/27/2020   CHOLHDL 2.8 02/15/2010    Significant Diagnostic Results in last 30 days:  No  results found.  Assessment/Plan 1. Weight loss (  Primary) - ongoing> associated with advanced dementia - followed by dietary - cont Boost and magic cups - now needs feeding assistance  2. Dementia with behavioral disturbance (HCC) - recent progression - 09/19 Zoloft  started - 10/06 Depakote  increased to BID> AST/ALT 19/19 06/22/2024 - see above - dependent with ADLs including feeding - cont Namenda  and hydroxyzine - consider hospice referral if weights continue to decline  3. Primary osteoarthritis involving multiple joints - ongoing - cont Tramadol    4. Bilateral leg edema - elevated BUN/creat 36/1.66 06/06/2024 - torsemide decreased to 20 mg daily - bmp pending 06/26/2024  5. Stage 3b chronic kidney disease (HCC) - see above - encourage hydration with water - avoid NSAIDS  6. Age-related osteoporosis without current pathological fracture - nonambulatory - cont calcium supplement    Family/ staff Communication: plan discussed with patient and nurse  Labs/tests ordered:  bmp pending 10/13

## 2024-06-27 DIAGNOSIS — M25551 Pain in right hip: Secondary | ICD-10-CM | POA: Diagnosis not present

## 2024-06-27 DIAGNOSIS — M6281 Muscle weakness (generalized): Secondary | ICD-10-CM | POA: Diagnosis not present

## 2024-06-27 DIAGNOSIS — Z9181 History of falling: Secondary | ICD-10-CM | POA: Diagnosis not present

## 2024-06-27 DIAGNOSIS — R41841 Cognitive communication deficit: Secondary | ICD-10-CM | POA: Diagnosis not present

## 2024-06-27 DIAGNOSIS — R278 Other lack of coordination: Secondary | ICD-10-CM | POA: Diagnosis not present

## 2024-06-27 DIAGNOSIS — R2681 Unsteadiness on feet: Secondary | ICD-10-CM | POA: Diagnosis not present

## 2024-06-28 DIAGNOSIS — M25551 Pain in right hip: Secondary | ICD-10-CM | POA: Diagnosis not present

## 2024-06-28 DIAGNOSIS — R41841 Cognitive communication deficit: Secondary | ICD-10-CM | POA: Diagnosis not present

## 2024-06-28 DIAGNOSIS — M6281 Muscle weakness (generalized): Secondary | ICD-10-CM | POA: Diagnosis not present

## 2024-06-28 DIAGNOSIS — R2681 Unsteadiness on feet: Secondary | ICD-10-CM | POA: Diagnosis not present

## 2024-06-28 DIAGNOSIS — R278 Other lack of coordination: Secondary | ICD-10-CM | POA: Diagnosis not present

## 2024-06-28 DIAGNOSIS — Z9181 History of falling: Secondary | ICD-10-CM | POA: Diagnosis not present

## 2024-06-29 ENCOUNTER — Non-Acute Institutional Stay (SKILLED_NURSING_FACILITY): Payer: Self-pay | Admitting: Internal Medicine

## 2024-06-29 DIAGNOSIS — M25551 Pain in right hip: Secondary | ICD-10-CM | POA: Diagnosis not present

## 2024-06-29 DIAGNOSIS — F0150 Vascular dementia without behavioral disturbance: Secondary | ICD-10-CM

## 2024-06-29 DIAGNOSIS — R634 Abnormal weight loss: Secondary | ICD-10-CM | POA: Diagnosis not present

## 2024-06-29 DIAGNOSIS — F32A Depression, unspecified: Secondary | ICD-10-CM | POA: Diagnosis not present

## 2024-06-29 DIAGNOSIS — R278 Other lack of coordination: Secondary | ICD-10-CM | POA: Diagnosis not present

## 2024-06-29 DIAGNOSIS — R2681 Unsteadiness on feet: Secondary | ICD-10-CM | POA: Diagnosis not present

## 2024-06-29 DIAGNOSIS — R600 Localized edema: Secondary | ICD-10-CM

## 2024-06-29 DIAGNOSIS — F0280 Dementia in other diseases classified elsewhere without behavioral disturbance: Secondary | ICD-10-CM

## 2024-06-29 DIAGNOSIS — G309 Alzheimer's disease, unspecified: Secondary | ICD-10-CM

## 2024-06-29 DIAGNOSIS — Z9181 History of falling: Secondary | ICD-10-CM | POA: Diagnosis not present

## 2024-06-29 DIAGNOSIS — R112 Nausea with vomiting, unspecified: Secondary | ICD-10-CM | POA: Diagnosis not present

## 2024-06-29 DIAGNOSIS — N1832 Chronic kidney disease, stage 3b: Secondary | ICD-10-CM | POA: Diagnosis not present

## 2024-06-29 DIAGNOSIS — R410 Disorientation, unspecified: Secondary | ICD-10-CM | POA: Diagnosis not present

## 2024-06-29 DIAGNOSIS — R41841 Cognitive communication deficit: Secondary | ICD-10-CM | POA: Diagnosis not present

## 2024-06-29 DIAGNOSIS — M6281 Muscle weakness (generalized): Secondary | ICD-10-CM | POA: Diagnosis not present

## 2024-06-30 DIAGNOSIS — M25551 Pain in right hip: Secondary | ICD-10-CM | POA: Diagnosis not present

## 2024-06-30 DIAGNOSIS — R2681 Unsteadiness on feet: Secondary | ICD-10-CM | POA: Diagnosis not present

## 2024-06-30 DIAGNOSIS — R278 Other lack of coordination: Secondary | ICD-10-CM | POA: Diagnosis not present

## 2024-06-30 DIAGNOSIS — R41841 Cognitive communication deficit: Secondary | ICD-10-CM | POA: Diagnosis not present

## 2024-06-30 DIAGNOSIS — Z9181 History of falling: Secondary | ICD-10-CM | POA: Diagnosis not present

## 2024-06-30 DIAGNOSIS — M6281 Muscle weakness (generalized): Secondary | ICD-10-CM | POA: Diagnosis not present

## 2024-07-03 ENCOUNTER — Encounter: Payer: Self-pay | Admitting: Internal Medicine

## 2024-07-03 DIAGNOSIS — M25551 Pain in right hip: Secondary | ICD-10-CM | POA: Diagnosis not present

## 2024-07-03 DIAGNOSIS — Z9181 History of falling: Secondary | ICD-10-CM | POA: Diagnosis not present

## 2024-07-03 DIAGNOSIS — R41841 Cognitive communication deficit: Secondary | ICD-10-CM | POA: Diagnosis not present

## 2024-07-03 DIAGNOSIS — M6281 Muscle weakness (generalized): Secondary | ICD-10-CM | POA: Diagnosis not present

## 2024-07-03 DIAGNOSIS — R278 Other lack of coordination: Secondary | ICD-10-CM | POA: Diagnosis not present

## 2024-07-03 DIAGNOSIS — R2681 Unsteadiness on feet: Secondary | ICD-10-CM | POA: Diagnosis not present

## 2024-07-03 NOTE — Progress Notes (Signed)
 Location: Medical illustrator of Service:  SNF (31)  Provider:   Code Status: DNR Goals of Care:     06/02/2024   11:11 AM  Advanced Directives  Does Patient Have a Medical Advance Directive? Yes  Type of Estate agent of [place];Living will;Out of facility DNR (pink MOST or yellow form)  Does patient want to make changes to medical advance directive? No - Patient declined  Copy of Healthcare Power of Attorney in Chart? Yes - validated most recent copy scanned in chart (See row information)     Chief Complaint  Patient presents with   Acute Visit    HPI: Patient is a 89 y.o. female seen today for an acute visit for Continue Depression and Poor Appetite  [place]   Patient was progressively getting less ambulatory due to her Arthritis and Cognition   Since her move she has depressed. Has been seen Crying  Has Lost 15 lbs since her Move Patient also has Worsening Cognition This morning she was sitting with her breakfast and eating very Slowly Did tell me she is depressed but said that she feels much better now  She also has lot of pain with the transfers and ADL care due to her Arthritis     Past Medical History:  Diagnosis Date   Anxiety    Cataract    Chest pain 02/16/2010   Myoview, negative for pharmacologic-stress induced ischemia, EF 74   Complication of anesthesia 1954   hard to wake up   Diverticulosis    DUB (dysfunctional uterine bleeding)    Dysrhythmia    h/o PVCs- none recently- no meds   Fall    GERD (gastroesophageal reflux disease)    Hearing reduced    Hypertension    no meds currently   Loss of consciousness (HCC)    Memory loss    Osteoarthritis of left hip    Osteoporosis    Palpitation 02/02/2013   A CardioNet MCOT (mobile cardiac outpatient telemetry ): NSR with PACs; sinus tachycardia with ventricular trigeminy. Brief atrial run. Facet cardiac; ventricular  trigeminy and accelerated idioventricular rhythm. No significant tachyarrhythmias.   Palpitations    Postmenopausal bleeding 11/01/2014   Unsteady gait    Varicose veins of both legs with pain     Past Surgical History:  Procedure Laterality Date   APPENDECTOMY     CATARACT EXTRACTION     both eyes   CESAREAN SECTION  1962, 1963, 8035,8030   x 4   COLONOSCOPY  01/14/2017   DIAGNOSTIC LAPAROSCOPY     ECTOPIC PREGNANCY SURGERY  1966   FRACTURE SURGERY     HIP SURGERY Right    HYSTEROSCOPY WITH D & C N/A 11/02/2014   Procedure: DILATATION AND CURETTAGE /HYSTEROSCOPY;  Surgeon: Ezzie Buba, MD;  Location: [facility];  Service: Gynecology;  Laterality: N/A;   Lower Extremity Venous Dopplers  01/2013   No thrombus or thrombophlebitis. R Greater Saphenous Vein - enlarged without other insufficiency (6 mm); right and left Small Saphenous Vein: Patent no valve insufficiency.   TONSILLECTOMY     TONSILLECTOMY AND ADENOIDECTOMY     TRANSTHORACIC ECHOCARDIOGRAM  01/2013   Normal LV size and function. EF 55-60%. Grade 1 diastolic dysfunction. Elevated LV filling pressures. No regional W. May.    Allergies  Allergen Reactions   Evista [Raloxifene]    Fosamax [Alendronate]    Penicillins    Polyoxyethylene Lauryl Ether [  Sorbitan]    Moxifloxacin Rash    Outpatient Encounter Medications as of 06/29/2024  Medication Sig   acetaminophen  (TYLENOL ) 500 MG tablet Take 2 tablets (1,000 mg total) by mouth 3 (three) times daily as needed.   acetaminophen  (TYLENOL ) 500 MG tablet Take 1,000 mg by mouth as needed.   Calcium Carbonate (CALCIUM 600 PO) Take 1 tablet by mouth daily.   divalproex  (DEPAKOTE  SPRINKLE) 125 MG capsule Take 1 capsule (125 mg total) by mouth 2 (two) times daily.   Emollient (CERAVE) LOTN Apply 1 application. topically daily.   fluticasone (FLONASE) 50 MCG/ACT nasal spray Place 1 spray into both nostrils daily.   hydrOXYzine (ATARAX) 10 MG tablet Take 1 tablet (10 mg  total) by mouth daily.   memantine  (NAMENDA ) 10 MG tablet Take 10 mg by mouth 2 (two) times daily.   MULTIPLE VITAMIN PO Take by mouth daily.   polyethylene glycol powder (GLYCOLAX/MIRALAX) 17 GM/SCOOP powder Take 17 g by mouth every other day. Dissolve 1 capful (17g) in 4-8 ounces of liquid and take by mouth daily.   potassium chloride (KLOR-CON) 10 MEQ tablet Take 10 mEq by mouth daily.   potassium chloride SA (KLOR-CON M) 20 MEQ tablet Take 20 mEq by mouth daily.   sertraline  (ZOLOFT ) 25 MG tablet Take 2 tablets (50 mg total) by mouth daily.   torsemide (DEMADEX) 20 MG tablet Take 40 mg by mouth daily.   traMADol  (ULTRAM ) 50 MG tablet Take 1 tablet (50 mg total) by mouth in the morning and at bedtime.   Zinc Oxide 10 % OINT Apply topically daily as needed.   No facility-administered encounter medications on file as of 06/29/2024.    Review of Systems:  Review of Systems  Unable to perform ROS: Dementia    Health Maintenance  Topic Date Due   Zoster Vaccines- Shingrix (1 of 2) 03/07/1983   Influenza Vaccine  04/14/2024   COVID-19 Vaccine (9 - 2025-26 season) 05/15/2024   Medicare Annual Wellness (AWV)  06/14/2025   DTaP/Tdap/Td (2 - Td or Tdap) 05/26/2026   Pneumococcal Vaccine: 50+ Years  Completed   Meningococcal B Vaccine  Aged Out   DEXA SCAN  Discontinued    Physical Exam: There were no vitals filed for this visit. There is no height or weight on file to calculate BMI. Physical Exam Vitals reviewed.  Constitutional:      Appearance: Normal appearance.  HENT:     Head: Normocephalic.     Nose: Nose normal.     Mouth/Throat:     Mouth: Mucous membranes are moist.     Pharynx: Oropharynx is clear.  Eyes:     Pupils: Pupils are equal, round, and reactive to light.  Cardiovascular:     Rate and Rhythm: Normal rate and regular rhythm.     Pulses: Normal pulses.     Heart sounds: Normal heart sounds. No murmur heard. Pulmonary:     Effort: Pulmonary effort is  normal.     Breath sounds: Normal breath sounds.  Abdominal:     General: Abdomen is flat. Bowel sounds are normal.     Palpations: Abdomen is soft.  Musculoskeletal:        General: Swelling present.     Cervical back: Neck supple.     Comments: Mild Edema  Skin:    General: Skin is warm.  Neurological:     General: No focal deficit present.     Mental Status: She is alert.  Psychiatric:  Mood and Affect: Mood normal.        Thought Content: Thought content normal.     Labs reviewed: Basic Metabolic Panel: Recent Labs    08/09/23 0000 12/17/23 0000  NA 140 140  K 4.4 4.3  CL 107 108  CO2 25* 25*  BUN 34* 37*  CREATININE 1.4* 1.3*  CALCIUM 9.1 8.9   Liver Function Tests: Recent Labs    08/09/23 0000 12/17/23 0000  AST 14 17  ALT 10 14  ALKPHOS 118 117  ALBUMIN 3.4* 3.2*   No results for input(s): LIPASE, AMYLASE in the last 8760 hours. No results for input(s): AMMONIA in the last 8760 hours. CBC: Recent Labs    08/09/23 0000 12/17/23 0000 01/17/24 0000  WBC 4.6 5.0 4.2  NEUTROABS  --  2,315.00 1,768.00  HGB 10.4* 9.9* 11.5*  HCT 31* 30* 35*  PLT 231 249 293   Lipid Panel: No results for input(s): CHOL, HDL, LDLCALC, TRIG, CHOLHDL, LDLDIRECT in the last 8760 hours. No results found for: HGBA1C  Procedures since last visit: No results found.  Assessment/Plan 1. Weight loss (Primary) Discontinue Torsemide she has Minimal Edema Start on Remeron 7.5 mg QHS Also Wrote for her to get Feeding assist  2. Mixed Alzheimer and vascular dementia (HCC) On Namenda  Worsening Cognition Needs more help 3. Acute depression On Zoloft  and Depakote   Also on Zoloft  Adding Remeron 4. Bilateral leg edema Minimal Discontinue Torsemide  5. Stage 3b chronic kidney disease (HCC) Creat stable Repeat BMP pending 6 Severe Arthritis On Tramadol     Labs/tests ordered:  * No order type specified * Next appt:  Visit date not found

## 2024-07-04 DIAGNOSIS — Z9181 History of falling: Secondary | ICD-10-CM | POA: Diagnosis not present

## 2024-07-04 DIAGNOSIS — M25551 Pain in right hip: Secondary | ICD-10-CM | POA: Diagnosis not present

## 2024-07-04 DIAGNOSIS — R41841 Cognitive communication deficit: Secondary | ICD-10-CM | POA: Diagnosis not present

## 2024-07-04 DIAGNOSIS — M6281 Muscle weakness (generalized): Secondary | ICD-10-CM | POA: Diagnosis not present

## 2024-07-04 DIAGNOSIS — R278 Other lack of coordination: Secondary | ICD-10-CM | POA: Diagnosis not present

## 2024-07-04 DIAGNOSIS — R2681 Unsteadiness on feet: Secondary | ICD-10-CM | POA: Diagnosis not present

## 2024-07-05 DIAGNOSIS — Z9181 History of falling: Secondary | ICD-10-CM | POA: Diagnosis not present

## 2024-07-05 DIAGNOSIS — M6281 Muscle weakness (generalized): Secondary | ICD-10-CM | POA: Diagnosis not present

## 2024-07-05 DIAGNOSIS — R278 Other lack of coordination: Secondary | ICD-10-CM | POA: Diagnosis not present

## 2024-07-05 DIAGNOSIS — R41841 Cognitive communication deficit: Secondary | ICD-10-CM | POA: Diagnosis not present

## 2024-07-05 DIAGNOSIS — M25551 Pain in right hip: Secondary | ICD-10-CM | POA: Diagnosis not present

## 2024-07-05 DIAGNOSIS — R2681 Unsteadiness on feet: Secondary | ICD-10-CM | POA: Diagnosis not present

## 2024-07-06 DIAGNOSIS — R2681 Unsteadiness on feet: Secondary | ICD-10-CM | POA: Diagnosis not present

## 2024-07-06 DIAGNOSIS — M6281 Muscle weakness (generalized): Secondary | ICD-10-CM | POA: Diagnosis not present

## 2024-07-06 DIAGNOSIS — I10 Essential (primary) hypertension: Secondary | ICD-10-CM | POA: Diagnosis not present

## 2024-07-06 DIAGNOSIS — M25551 Pain in right hip: Secondary | ICD-10-CM | POA: Diagnosis not present

## 2024-07-06 DIAGNOSIS — R278 Other lack of coordination: Secondary | ICD-10-CM | POA: Diagnosis not present

## 2024-07-06 DIAGNOSIS — Z9181 History of falling: Secondary | ICD-10-CM | POA: Diagnosis not present

## 2024-07-06 DIAGNOSIS — R41841 Cognitive communication deficit: Secondary | ICD-10-CM | POA: Diagnosis not present

## 2024-07-07 DIAGNOSIS — R41841 Cognitive communication deficit: Secondary | ICD-10-CM | POA: Diagnosis not present

## 2024-07-07 DIAGNOSIS — R2681 Unsteadiness on feet: Secondary | ICD-10-CM | POA: Diagnosis not present

## 2024-07-07 DIAGNOSIS — M25551 Pain in right hip: Secondary | ICD-10-CM | POA: Diagnosis not present

## 2024-07-07 DIAGNOSIS — M6281 Muscle weakness (generalized): Secondary | ICD-10-CM | POA: Diagnosis not present

## 2024-07-07 DIAGNOSIS — Z9181 History of falling: Secondary | ICD-10-CM | POA: Diagnosis not present

## 2024-07-07 DIAGNOSIS — R278 Other lack of coordination: Secondary | ICD-10-CM | POA: Diagnosis not present

## 2024-07-08 ENCOUNTER — Other Ambulatory Visit: Payer: Self-pay | Admitting: Nurse Practitioner

## 2024-07-08 MED ORDER — TRAMADOL HCL 50 MG PO TABS: 50.0000 mg | ORAL_TABLET | Freq: Two times a day (BID) | ORAL | 0 refills | Status: DC

## 2024-07-09 DIAGNOSIS — M6281 Muscle weakness (generalized): Secondary | ICD-10-CM | POA: Diagnosis not present

## 2024-07-09 DIAGNOSIS — R41841 Cognitive communication deficit: Secondary | ICD-10-CM | POA: Diagnosis not present

## 2024-07-09 DIAGNOSIS — Z9181 History of falling: Secondary | ICD-10-CM | POA: Diagnosis not present

## 2024-07-09 DIAGNOSIS — R2681 Unsteadiness on feet: Secondary | ICD-10-CM | POA: Diagnosis not present

## 2024-07-09 DIAGNOSIS — M25551 Pain in right hip: Secondary | ICD-10-CM | POA: Diagnosis not present

## 2024-07-09 DIAGNOSIS — R278 Other lack of coordination: Secondary | ICD-10-CM | POA: Diagnosis not present

## 2024-07-10 DIAGNOSIS — M25551 Pain in right hip: Secondary | ICD-10-CM | POA: Diagnosis not present

## 2024-07-10 DIAGNOSIS — R2681 Unsteadiness on feet: Secondary | ICD-10-CM | POA: Diagnosis not present

## 2024-07-10 DIAGNOSIS — R278 Other lack of coordination: Secondary | ICD-10-CM | POA: Diagnosis not present

## 2024-07-10 DIAGNOSIS — M6281 Muscle weakness (generalized): Secondary | ICD-10-CM | POA: Diagnosis not present

## 2024-07-10 DIAGNOSIS — Z9181 History of falling: Secondary | ICD-10-CM | POA: Diagnosis not present

## 2024-07-10 DIAGNOSIS — R41841 Cognitive communication deficit: Secondary | ICD-10-CM | POA: Diagnosis not present

## 2024-07-11 DIAGNOSIS — Z23 Encounter for immunization: Secondary | ICD-10-CM | POA: Diagnosis not present

## 2024-07-11 DIAGNOSIS — M6281 Muscle weakness (generalized): Secondary | ICD-10-CM | POA: Diagnosis not present

## 2024-07-11 DIAGNOSIS — Z9181 History of falling: Secondary | ICD-10-CM | POA: Diagnosis not present

## 2024-07-11 DIAGNOSIS — R2681 Unsteadiness on feet: Secondary | ICD-10-CM | POA: Diagnosis not present

## 2024-07-11 DIAGNOSIS — R278 Other lack of coordination: Secondary | ICD-10-CM | POA: Diagnosis not present

## 2024-07-11 DIAGNOSIS — R41841 Cognitive communication deficit: Secondary | ICD-10-CM | POA: Diagnosis not present

## 2024-07-11 DIAGNOSIS — M25551 Pain in right hip: Secondary | ICD-10-CM | POA: Diagnosis not present

## 2024-07-12 DIAGNOSIS — Z9181 History of falling: Secondary | ICD-10-CM | POA: Diagnosis not present

## 2024-07-12 DIAGNOSIS — R41841 Cognitive communication deficit: Secondary | ICD-10-CM | POA: Diagnosis not present

## 2024-07-12 DIAGNOSIS — R278 Other lack of coordination: Secondary | ICD-10-CM | POA: Diagnosis not present

## 2024-07-12 DIAGNOSIS — R2681 Unsteadiness on feet: Secondary | ICD-10-CM | POA: Diagnosis not present

## 2024-07-12 DIAGNOSIS — M6281 Muscle weakness (generalized): Secondary | ICD-10-CM | POA: Diagnosis not present

## 2024-07-12 DIAGNOSIS — M25551 Pain in right hip: Secondary | ICD-10-CM | POA: Diagnosis not present

## 2024-07-13 ENCOUNTER — Non-Acute Institutional Stay (SKILLED_NURSING_FACILITY): Payer: Self-pay | Admitting: Internal Medicine

## 2024-07-13 DIAGNOSIS — M150 Primary generalized (osteo)arthritis: Secondary | ICD-10-CM

## 2024-07-13 DIAGNOSIS — N1832 Chronic kidney disease, stage 3b: Secondary | ICD-10-CM | POA: Diagnosis not present

## 2024-07-13 DIAGNOSIS — F0280 Dementia in other diseases classified elsewhere without behavioral disturbance: Secondary | ICD-10-CM | POA: Diagnosis not present

## 2024-07-13 DIAGNOSIS — G309 Alzheimer's disease, unspecified: Secondary | ICD-10-CM

## 2024-07-13 DIAGNOSIS — R278 Other lack of coordination: Secondary | ICD-10-CM | POA: Diagnosis not present

## 2024-07-13 DIAGNOSIS — R600 Localized edema: Secondary | ICD-10-CM

## 2024-07-13 DIAGNOSIS — F0150 Vascular dementia without behavioral disturbance: Secondary | ICD-10-CM

## 2024-07-13 DIAGNOSIS — F32A Depression, unspecified: Secondary | ICD-10-CM | POA: Diagnosis not present

## 2024-07-13 DIAGNOSIS — M25551 Pain in right hip: Secondary | ICD-10-CM | POA: Diagnosis not present

## 2024-07-13 DIAGNOSIS — R2681 Unsteadiness on feet: Secondary | ICD-10-CM | POA: Diagnosis not present

## 2024-07-13 DIAGNOSIS — M6281 Muscle weakness (generalized): Secondary | ICD-10-CM | POA: Diagnosis not present

## 2024-07-13 DIAGNOSIS — R41841 Cognitive communication deficit: Secondary | ICD-10-CM | POA: Diagnosis not present

## 2024-07-13 DIAGNOSIS — Z9181 History of falling: Secondary | ICD-10-CM | POA: Diagnosis not present

## 2024-07-13 NOTE — Progress Notes (Unsigned)
 Location:  [facility] Service:  SNF (31)  Provider:   Code Status: DNR Goals of Care:     06/02/2024   11:11 AM  Advanced Directives  Does Patient Have a Medical Advance Directive? Yes  Type of Estate Agent of [place];Living will;Out of facility DNR (pink MOST or yellow form)  Does patient want to make changes to medical advance directive? No - Patient declined  Copy of Healthcare Power of Attorney in Chart? Yes - validated most recent copy scanned in chart (See row information)     Chief Complaint  Patient presents with   Acute Visit    HPI: Patient is a 89 y.o. female seen today for Acute Visit For depression and Hallucinations and LE edema  [place]   Patient was progressively getting less ambulatory due to her Arthritis and Cognition   Since her move she has depressed. Has been seen Crying  Has Lost 15 lbs since her Move Patient also has Worsening Cognition  Started on Zoloft  and Remeron Also Stopped her Torsemide  Patient is doing better but per nurses she sometimes sees Bugs in her Room Also Continue to have pain due to her Arthritis  When I went to see her she was sitting in her recliner I asked her about Bugs She said she sees them right now on the window And her Bed But she did not look in any distress  Also Noticed her swelling has come back since Torsemide was Discontinued  No SOB or Cough   Continues ot have pain due to her Arthritis  Wt Readings from Last 3 Encounters:  07/13/24 150 lb (68 kg)  06/29/24 134 lb (60.8 kg)  06/26/24 138 lb 12.8 oz (63 kg)    Her weight is also Up now      Past Medical History:  Diagnosis Date   Anxiety    Cataract    Chest pain 02/16/2010   Myoview, negative for pharmacologic-stress induced ischemia, EF 74   Complication of anesthesia 1954   hard to wake up   Diverticulosis    DUB (dysfunctional uterine bleeding)     Dysrhythmia    h/o PVCs- none recently- no meds   Fall    GERD (gastroesophageal reflux disease)    Hearing reduced    Hypertension    no meds currently   Loss of consciousness (HCC)    Memory loss    Osteoarthritis of left hip    Osteoporosis    Palpitation 02/02/2013   A CardioNet MCOT (mobile cardiac outpatient telemetry ): NSR with PACs; sinus tachycardia with ventricular trigeminy. Brief atrial run. Facet cardiac; ventricular trigeminy and accelerated idioventricular rhythm. No significant tachyarrhythmias.   Palpitations    Postmenopausal bleeding 11/01/2014   Unsteady gait    Varicose veins of both legs with pain     Past Surgical History:  Procedure Laterality Date   APPENDECTOMY     CATARACT EXTRACTION     both eyes   CESAREAN SECTION  1962, 1963, 8035,8030   x 4   COLONOSCOPY  01/14/2017   DIAGNOSTIC LAPAROSCOPY     ECTOPIC PREGNANCY SURGERY  1966   FRACTURE SURGERY     HIP SURGERY Right    HYSTEROSCOPY WITH D & C N/A 11/02/2014   Procedure: DILATATION AND CURETTAGE /HYSTEROSCOPY;  Surgeon: Ezzie Buba, MD;  Location: [facility];  Service: Gynecology;  Laterality: N/A;   Lower Extremity Venous  Dopplers  01/2013   No thrombus or thrombophlebitis. R Greater Saphenous Vein - enlarged without other insufficiency (6 mm); right and left Small Saphenous Vein: Patent no valve insufficiency.   TONSILLECTOMY     TONSILLECTOMY AND ADENOIDECTOMY     TRANSTHORACIC ECHOCARDIOGRAM  01/2013   Normal LV size and function. EF 55-60%. Grade 1 diastolic dysfunction. Elevated LV filling pressures. No regional W. May.    Allergies  Allergen Reactions   Evista [Raloxifene]    Fosamax [Alendronate]    Penicillins    Polyoxyethylene Lauryl Ether [Sorbitan]    Moxifloxacin Rash    Outpatient Encounter Medications as of 07/13/2024  Medication Sig   acetaminophen  (TYLENOL ) 500 MG tablet Take 2 tablets (1,000 mg total) by mouth 3 (three) times daily as needed.   acetaminophen   (TYLENOL ) 500 MG tablet Take 1,000 mg by mouth as needed.   Calcium Carbonate (CALCIUM 600 PO) Take 1 tablet by mouth daily.   divalproex  (DEPAKOTE  SPRINKLE) 125 MG capsule Take 1 capsule (125 mg total) by mouth 2 (two) times daily.   Emollient (CERAVE) LOTN Apply 1 application. topically daily.   fluticasone (FLONASE) 50 MCG/ACT nasal spray Place 1 spray into both nostrils daily.   memantine  (NAMENDA ) 10 MG tablet Take 10 mg by mouth 2 (two) times daily.   mirtazapine (REMERON) 7.5 MG tablet Take 7.5 mg by mouth at bedtime.   MULTIPLE VITAMIN PO Take by mouth daily.   polyethylene glycol powder (GLYCOLAX/MIRALAX) 17 GM/SCOOP powder Take 17 g by mouth every other day. Dissolve 1 capful (17g) in 4-8 ounces of liquid and take by mouth daily.   sennosides-docusate sodium (SENOKOT-S) 8.6-50 MG tablet Take 2 tablets by mouth daily.   sertraline  (ZOLOFT ) 25 MG tablet Take 2 tablets (50 mg total) by mouth daily.   traMADol  (ULTRAM ) 50 MG tablet Take 1 tablet (50 mg total) by mouth in the morning and at bedtime.   Zinc Oxide 10 % OINT Apply topically daily as needed.   No facility-administered encounter medications on file as of 07/13/2024.    Review of Systems:  Review of Systems  Unable to perform ROS: Dementia    Health Maintenance  Topic Date Due   Zoster Vaccines- Shingrix (1 of 2) 03/07/1983   Influenza Vaccine  04/14/2024   COVID-19 Vaccine (9 - 2025-26 season) 05/15/2024   Medicare Annual Wellness (AWV)  06/14/2025   DTaP/Tdap/Td (2 - Td or Tdap) 05/26/2026   Pneumococcal Vaccine: 50+ Years  Completed   Meningococcal B Vaccine  Aged Out   DEXA SCAN  Discontinued    Physical Exam: There were no vitals filed for this visit. There is no height or weight on file to calculate BMI. Physical Exam Vitals reviewed.  Constitutional:      Appearance: Normal appearance.  HENT:     Head: Normocephalic.     Nose: Nose normal.     Mouth/Throat:     Mouth: Mucous membranes are moist.      Pharynx: Oropharynx is clear.  Eyes:     Pupils: Pupils are equal, round, and reactive to light.  Cardiovascular:     Rate and Rhythm: Normal rate and regular rhythm.     Pulses: Normal pulses.     Heart sounds: Normal heart sounds. No murmur heard. Pulmonary:     Effort: Pulmonary effort is normal.     Breath sounds: Normal breath sounds.  Abdominal:     General: Abdomen is flat. Bowel sounds are normal.  Palpations: Abdomen is soft.  Musculoskeletal:        General: Swelling present.     Cervical back: Neck supple.  Skin:    General: Skin is warm.  Neurological:     General: No focal deficit present.     Mental Status: She is alert.  Psychiatric:        Mood and Affect: Mood normal.        Thought Content: Thought content normal.     Labs reviewed: Basic Metabolic Panel: Recent Labs    08/09/23 0000 12/17/23 0000  NA 140 140  K 4.4 4.3  CL 107 108  CO2 25* 25*  BUN 34* 37*  CREATININE 1.4* 1.3*  CALCIUM 9.1 8.9   Liver Function Tests: Recent Labs    08/09/23 0000 12/17/23 0000  AST 14 17  ALT 10 14  ALKPHOS 118 117  ALBUMIN 3.4* 3.2*   No results for input(s): LIPASE, AMYLASE in the last 8760 hours. No results for input(s): AMMONIA in the last 8760 hours. CBC: Recent Labs    08/09/23 0000 12/17/23 0000 01/17/24 0000  WBC 4.6 5.0 4.2  NEUTROABS  --  2,315.00 1,768.00  HGB 10.4* 9.9* 11.5*  HCT 31* 30* 35*  PLT 231 249 293   Lipid Panel: No results for input(s): CHOL, HDL, LDLCALC, TRIG, CHOLHDL, LDLDIRECT in the last 8760 hours. No results found for: HGBA1C  Procedures since last visit: No results found.  Assessment/Plan 1. Bilateral leg edema (Primary) Restart Torsemide 20 mg 3/week Repeat BMP in 2 weeks  2. Mixed Alzheimer and vascular dementia (HCC) On Namenda  and Depakote  for behavior  3. Acute depression with Hallucinations Remeron Change Zoloft  to 75 mg D/W the Staff Patient is not in any distress and  does not bother any one due to her Hallucinations 4. Stage 3b chronic kidney disease (HCC) Follow on Torsemide  5. Primary osteoarthritis involving multiple joints Tramadol      Labs/tests ordered:  BMP in 2 weeks Next appt:  Visit date not found  Total time spent in this patient care encounter was 45 _  minutes; greater than 50% of the visit spent counseling pstaff, reviewing records , Labs and coordinating care for problems addressed at this encounter.

## 2024-07-14 DIAGNOSIS — R2681 Unsteadiness on feet: Secondary | ICD-10-CM | POA: Diagnosis not present

## 2024-07-14 DIAGNOSIS — M25551 Pain in right hip: Secondary | ICD-10-CM | POA: Diagnosis not present

## 2024-07-14 DIAGNOSIS — R41841 Cognitive communication deficit: Secondary | ICD-10-CM | POA: Diagnosis not present

## 2024-07-14 DIAGNOSIS — R278 Other lack of coordination: Secondary | ICD-10-CM | POA: Diagnosis not present

## 2024-07-14 DIAGNOSIS — Z9181 History of falling: Secondary | ICD-10-CM | POA: Diagnosis not present

## 2024-07-14 DIAGNOSIS — M6281 Muscle weakness (generalized): Secondary | ICD-10-CM | POA: Diagnosis not present

## 2024-07-16 ENCOUNTER — Encounter: Payer: Self-pay | Admitting: Internal Medicine

## 2024-07-16 MED ORDER — SERTRALINE HCL 50 MG PO TABS: 75.0000 mg | ORAL_TABLET | Freq: Every day | ORAL | Status: AC

## 2024-07-16 MED ORDER — TORSEMIDE 20 MG PO TABS: 20.0000 mg | ORAL_TABLET | ORAL | Status: DC

## 2024-07-16 MED ORDER — TORSEMIDE 20 MG PO TABS: 20.0000 mg | ORAL_TABLET | ORAL | Status: AC

## 2024-07-17 DIAGNOSIS — R2681 Unsteadiness on feet: Secondary | ICD-10-CM | POA: Diagnosis not present

## 2024-07-17 DIAGNOSIS — M25562 Pain in left knee: Secondary | ICD-10-CM | POA: Diagnosis not present

## 2024-07-17 DIAGNOSIS — M25551 Pain in right hip: Secondary | ICD-10-CM | POA: Diagnosis not present

## 2024-07-17 DIAGNOSIS — Z9181 History of falling: Secondary | ICD-10-CM | POA: Diagnosis not present

## 2024-07-17 DIAGNOSIS — G309 Alzheimer's disease, unspecified: Secondary | ICD-10-CM | POA: Diagnosis not present

## 2024-07-17 DIAGNOSIS — R41841 Cognitive communication deficit: Secondary | ICD-10-CM | POA: Diagnosis not present

## 2024-07-17 DIAGNOSIS — M25561 Pain in right knee: Secondary | ICD-10-CM | POA: Diagnosis not present

## 2024-07-17 DIAGNOSIS — R278 Other lack of coordination: Secondary | ICD-10-CM | POA: Diagnosis not present

## 2024-07-17 DIAGNOSIS — M6281 Muscle weakness (generalized): Secondary | ICD-10-CM | POA: Diagnosis not present

## 2024-07-17 DIAGNOSIS — M25552 Pain in left hip: Secondary | ICD-10-CM | POA: Diagnosis not present

## 2024-07-18 DIAGNOSIS — M6281 Muscle weakness (generalized): Secondary | ICD-10-CM | POA: Diagnosis not present

## 2024-07-18 DIAGNOSIS — Z9181 History of falling: Secondary | ICD-10-CM | POA: Diagnosis not present

## 2024-07-18 DIAGNOSIS — R2681 Unsteadiness on feet: Secondary | ICD-10-CM | POA: Diagnosis not present

## 2024-07-18 DIAGNOSIS — M25551 Pain in right hip: Secondary | ICD-10-CM | POA: Diagnosis not present

## 2024-07-18 DIAGNOSIS — R41841 Cognitive communication deficit: Secondary | ICD-10-CM | POA: Diagnosis not present

## 2024-07-18 DIAGNOSIS — R278 Other lack of coordination: Secondary | ICD-10-CM | POA: Diagnosis not present

## 2024-07-19 DIAGNOSIS — M6281 Muscle weakness (generalized): Secondary | ICD-10-CM | POA: Diagnosis not present

## 2024-07-19 DIAGNOSIS — R41841 Cognitive communication deficit: Secondary | ICD-10-CM | POA: Diagnosis not present

## 2024-07-19 DIAGNOSIS — Z9181 History of falling: Secondary | ICD-10-CM | POA: Diagnosis not present

## 2024-07-19 DIAGNOSIS — M25551 Pain in right hip: Secondary | ICD-10-CM | POA: Diagnosis not present

## 2024-07-19 DIAGNOSIS — R2681 Unsteadiness on feet: Secondary | ICD-10-CM | POA: Diagnosis not present

## 2024-07-19 DIAGNOSIS — R278 Other lack of coordination: Secondary | ICD-10-CM | POA: Diagnosis not present

## 2024-07-21 DIAGNOSIS — Z9181 History of falling: Secondary | ICD-10-CM | POA: Diagnosis not present

## 2024-07-21 DIAGNOSIS — R41841 Cognitive communication deficit: Secondary | ICD-10-CM | POA: Diagnosis not present

## 2024-07-21 DIAGNOSIS — R2681 Unsteadiness on feet: Secondary | ICD-10-CM | POA: Diagnosis not present

## 2024-07-21 DIAGNOSIS — R278 Other lack of coordination: Secondary | ICD-10-CM | POA: Diagnosis not present

## 2024-07-21 DIAGNOSIS — M25551 Pain in right hip: Secondary | ICD-10-CM | POA: Diagnosis not present

## 2024-07-21 DIAGNOSIS — M6281 Muscle weakness (generalized): Secondary | ICD-10-CM | POA: Diagnosis not present

## 2024-07-23 ENCOUNTER — Telehealth: Payer: Self-pay | Admitting: Family

## 2024-07-23 NOTE — Telephone Encounter (Signed)
 [facility] facility denies called to report patient not feeling well has been sleeping most of the day with poor appetite. Blood pressure stable but temperature 100.2 orders given for rapid COVID-19  and influenza swab test, stat CBC with differential, CMP, portable chest x-ray 2 views and collect urine specimen for urine analysis and culture and sensitivity.  May In and Out cath if unable to void. Please follow-up

## 2024-07-24 ENCOUNTER — Encounter: Payer: Self-pay | Admitting: Nurse Practitioner

## 2024-07-24 ENCOUNTER — Non-Acute Institutional Stay (SKILLED_NURSING_FACILITY): Payer: Self-pay | Admitting: Nurse Practitioner

## 2024-07-24 DIAGNOSIS — R278 Other lack of coordination: Secondary | ICD-10-CM | POA: Diagnosis not present

## 2024-07-24 DIAGNOSIS — L89626 Pressure-induced deep tissue damage of left heel: Secondary | ICD-10-CM | POA: Diagnosis not present

## 2024-07-24 DIAGNOSIS — F339 Major depressive disorder, recurrent, unspecified: Secondary | ICD-10-CM | POA: Insufficient documentation

## 2024-07-24 DIAGNOSIS — N390 Urinary tract infection, site not specified: Secondary | ICD-10-CM | POA: Diagnosis not present

## 2024-07-24 DIAGNOSIS — M25551 Pain in right hip: Secondary | ICD-10-CM | POA: Diagnosis not present

## 2024-07-24 DIAGNOSIS — R0989 Other specified symptoms and signs involving the circulatory and respiratory systems: Secondary | ICD-10-CM | POA: Insufficient documentation

## 2024-07-24 DIAGNOSIS — F0390 Unspecified dementia without behavioral disturbance: Secondary | ICD-10-CM

## 2024-07-24 DIAGNOSIS — Z9181 History of falling: Secondary | ICD-10-CM | POA: Diagnosis not present

## 2024-07-24 DIAGNOSIS — R509 Fever, unspecified: Secondary | ICD-10-CM | POA: Insufficient documentation

## 2024-07-24 DIAGNOSIS — I10 Essential (primary) hypertension: Secondary | ICD-10-CM | POA: Diagnosis not present

## 2024-07-24 DIAGNOSIS — R2681 Unsteadiness on feet: Secondary | ICD-10-CM | POA: Diagnosis not present

## 2024-07-24 DIAGNOSIS — R41841 Cognitive communication deficit: Secondary | ICD-10-CM | POA: Diagnosis not present

## 2024-07-24 DIAGNOSIS — N1832 Chronic kidney disease, stage 3b: Secondary | ICD-10-CM

## 2024-07-24 DIAGNOSIS — M6281 Muscle weakness (generalized): Secondary | ICD-10-CM | POA: Diagnosis not present

## 2024-07-24 MED ORDER — TORSEMIDE 20 MG PO TABS: 20.0000 mg | ORAL_TABLET | Freq: Every day | ORAL | 0 refills | Status: AC

## 2024-07-24 NOTE — Assessment & Plan Note (Signed)
 Bun/creat 37/1.78 06/15/24>>23/1.34 07/24/24

## 2024-07-24 NOTE — Assessment & Plan Note (Signed)
 07/23/2024 reported the patient very sleepy, not feeling good, temperature 102.2, pending chest x-ray, CBC/differential, CMP/eGFR, UA C/S results Negative COVID and flu test 07/24/2024 the patient is up eating breakfast in dining room, afebrile, in her usual state of health Continue to observe the patient and the follow-up with test the results

## 2024-07-24 NOTE — Assessment & Plan Note (Signed)
 taking Torsemide

## 2024-07-24 NOTE — Assessment & Plan Note (Signed)
 taking Namenda , TSH 1.78 06/15/24

## 2024-07-24 NOTE — Progress Notes (Signed)
 Location:   [facility] Room Number: 44 Place of Service:  SNF (31) Provider: Larwance Jaxtin Raimondo NP  Charlanne Fredia CROME, MD  Patient Care Team: Charlanne Fredia CROME, MD as PCP - General (Internal Medicine) Pa, [facility] (Ophthalmology) Dermatology, [facility] (Dermatology)  Extended Emergency Contact Information Primary Emergency Contact: Jones,Beth Address: [address]          [place], [place] [address] [place] Home Phone: [phone] Mobile Phone: [phone] Relation: Daughter  Code Status:  DNR Goals of care: Advanced Directive information    06/02/2024   11:11 AM  Advanced Directives  Does Patient Have a Medical Advance Directive? Yes  Type of Estate Agent of [place];Living will;Out of facility DNR (pink MOST or yellow form)  Does patient want to make changes to medical advance directive? No - Patient declined  Copy of Healthcare Power of Attorney in Chart? Yes - validated most recent copy scanned in chart (See row information)     Chief Complaint  Patient presents with  . Acute Visit    Generalized weakness Low-grade temp Decreased appetite    HPI:  Pt is a 89 y.o. female seen today for an acute visit for 100.2, negative rapid COVID-19  and influenza swab test, pending CBC with differential, CMP, portable chest x-ray 2 views and collect urine specimen for urine analysis and culture and sensitivity.   The patient was seen eating breakfast in the dining room, denied chest pain, palpitation, shortness of breath, nausea, vomiting, ABD pain, or dysuria.  She is afebrile this morning, no O2 desaturation  CKD Bun/creat 37/1.78 06/15/24>>23/1.34 07/24/24             Dementia, taking Namenda , TSH 1.78 06/15/24             HTN, taking Torsemide             HLD, not take statin.              Venous insufficiency, taking Torsemide, Kcl.              GERD, not taking acid reducer. Hgb 11 07/24/24             OA, taking Tylenol ,  Tramadol .             Hx of diverticulitis             Depression, on Sertraline  since 06/02/24, Depakote  since 06/09/24, and Mirtazapine 07/03/24             Constipation, on MiraLax, Senokot       Past Medical History:  Diagnosis Date  . Anxiety   . Cataract   . Chest pain 02/16/2010   Myoview, negative for pharmacologic-stress induced ischemia, EF 74  . Complication of anesthesia 1954   hard to wake up  . Diverticulosis   . DUB (dysfunctional uterine bleeding)   . Dysrhythmia    h/o PVCs- none recently- no meds  . Fall   . GERD (gastroesophageal reflux disease)   . Hearing reduced   . Hypertension    no meds currently  . Loss of consciousness (HCC)   . Memory loss   . Osteoarthritis of left hip   . Osteoporosis   . Palpitation 02/02/2013   A CardioNet MCOT (mobile cardiac outpatient telemetry ): NSR with PACs; sinus tachycardia with ventricular trigeminy. Brief atrial run. Facet cardiac; ventricular trigeminy and accelerated idioventricular rhythm. No significant tachyarrhythmias.  . Palpitations   .  Postmenopausal bleeding 11/01/2014  . Unsteady gait   . Varicose veins of both legs with pain    Past Surgical History:  Procedure Laterality Date  . APPENDECTOMY    . CATARACT EXTRACTION     both eyes  . CESAREAN SECTION  1962, 1963, [id number]   x 4  . COLONOSCOPY  01/14/2017  . DIAGNOSTIC LAPAROSCOPY    . ECTOPIC PREGNANCY SURGERY  1966  . FRACTURE SURGERY    . HIP SURGERY Right   . HYSTEROSCOPY WITH D & C N/A 11/02/2014   Procedure: DILATATION AND CURETTAGE /HYSTEROSCOPY;  Surgeon: Ezzie Buba, MD;  Location: [facility];  Service: Gynecology;  Laterality: N/A;  . Lower Extremity Venous Dopplers  01/2013   No thrombus or thrombophlebitis. R Greater Saphenous Vein - enlarged without other insufficiency (6 mm); right and left Small Saphenous Vein: Patent no valve insufficiency.  . TONSILLECTOMY    . TONSILLECTOMY AND ADENOIDECTOMY    . TRANSTHORACIC  ECHOCARDIOGRAM  01/2013   Normal LV size and function. EF 55-60%. Grade 1 diastolic dysfunction. Elevated LV filling pressures. No regional W. May.    Allergies  Allergen Reactions  . Evista [Raloxifene]   . Fosamax [Alendronate]   . Penicillins   . Polyoxyethylene Lauryl Ether [Sorbitan]   . Moxifloxacin Rash    Allergies as of 07/24/2024       Reactions   Evista [raloxifene]    Fosamax [alendronate]    Penicillins    Polyoxyethylene Lauryl Ether [sorbitan]    Moxifloxacin Rash        Medication List        Accurate as of July 24, 2024 11:59 PM. If you have any questions, ask your nurse or doctor.          acetaminophen  500 MG tablet Commonly known as: TYLENOL  Take 2 tablets (1,000 mg total) by mouth 3 (three) times daily as needed.   CALCIUM 600 PO Take 1 tablet by mouth daily.   CeraVe Lotn Apply 1 application. topically daily.   divalproex  125 MG capsule Commonly known as: DEPAKOTE  SPRINKLE Take 1 capsule (125 mg total) by mouth 2 (two) times daily.   fluticasone 50 MCG/ACT nasal spray Commonly known as: FLONASE Place 1 spray into both nostrils daily.   memantine  10 MG tablet Commonly known as: NAMENDA  Take 10 mg by mouth 2 (two) times daily.   mirtazapine 7.5 MG tablet Commonly known as: REMERON Take 7.5 mg by mouth at bedtime.   MULTIPLE VITAMIN PO Take by mouth daily.   nitrofurantoin (macrocrystal-monohydrate) 100 MG capsule Commonly known as: MACROBID Take 1 capsule (100 mg total) by mouth 2 (two) times daily. Started by: Jasyn Mey X Husain Costabile   polyethylene glycol powder 17 GM/SCOOP powder Commonly known as: GLYCOLAX/MIRALAX Take 17 g by mouth every other day. Dissolve 1 capful (17g) in 4-8 ounces of liquid and take by mouth daily.   sennosides-docusate sodium 8.6-50 MG tablet Commonly known as: SENOKOT-S Take 2 tablets by mouth daily.   sertraline  50 MG tablet Commonly known as: ZOLOFT  Take 1.5 tablets (75 mg total) by mouth daily.    torsemide 20 MG tablet Commonly known as: DEMADEX Take 1 tablet (20 mg total) by mouth 3 (three) times a week. What changed: Another medication with the same name was added. Make sure you understand how and when to take each. Changed by: Calissa Swenor X Allante Whitmire   torsemide 20 MG tablet Commonly known as: DEMADEX Take 1 tablet (20 mg total) by mouth daily for 3  days. What changed: You were already taking a medication with the same name, and this prescription was added. Make sure you understand how and when to take each. Changed by: Sallie Staron X Kaly Mcquary   traMADol  50 MG tablet Commonly known as: ULTRAM  Take 1 tablet (50 mg total) by mouth in the morning and at bedtime.   Zinc Oxide 10 % Oint Apply topically daily as needed.        Review of Systems  Constitutional:  Positive for appetite change and fatigue. Negative for activity change.  HENT:  Positive for hearing loss. Negative for congestion and trouble swallowing.   Eyes:  Negative for visual disturbance.  Respiratory:  Negative for cough and shortness of breath.   Cardiovascular:  Negative for leg swelling.  Gastrointestinal:  Negative for abdominal pain, constipation and nausea.  Genitourinary:  Negative for dysuria and urgency.  Musculoskeletal:  Positive for gait problem.  Skin:  Negative for color change.  Neurological:  Negative for speech difficulty, weakness and headaches.       Dementia  Psychiatric/Behavioral:  Positive for behavioral problems and confusion. Negative for sleep disturbance. The patient is not nervous/anxious.        Reported restlessness     Immunization History  Administered Date(s) Administered  . Fluad Quad(high Dose 65+) 07/08/2022  . INFLUENZA, HIGH DOSE SEASONAL PF 07/13/2023  . Influenza, Quadrivalent, Recombinant, Inj, Pf 06/18/2018, 05/04/2019, 06/15/2020  . Influenza-Unspecified 09/15/2019, 07/08/2021, 07/08/2023  . Moderna Covid-19 Fall Seasonal Vaccine 9yrs & older 01/05/2024  . Moderna Covid-19  Vaccine Bivalent Booster 53yrs & up 07/21/2023  . Moderna Sars-Covid-2 Vaccination 09/15/2019  . PFIZER(Purple Top)SARS-COV-2 Vaccination 10/03/2019, 10/22/2019, 09/03/2020  . PPD Test 10/09/2020  . Pfizer Covid-19 Vaccine Bivalent Booster 50yrs & up 06/04/2021  . Pneumococcal Conjugate-13 05/28/2014  . Pneumococcal Polysaccharide-23 07/15/1998, 06/15/2005  . RSV,unspecified 08/29/2022  . Tdap 05/26/2016  . Unspecified SARS-COV-2 Vaccination 03/03/2022  . Zoster, Live 05/18/2013, 05/28/2014   Pertinent  Health Maintenance Due  Topic Date Due  . Influenza Vaccine  04/14/2024  . DEXA SCAN  Discontinued      05/24/2024    3:41 PM 06/02/2024   12:48 PM 06/09/2024    3:49 PM 06/14/2024    1:46 PM 06/26/2024    1:26 PM  Fall Risk  Falls in the past year? 1 1 1 1 1   Was there an injury with Fall? 0 0 0 0 0  Fall Risk Category Calculator 2 2 2 2 2   Patient at Risk for Falls Due to History of fall(s);Impaired balance/gait History of fall(s);Impaired balance/gait History of fall(s);Impaired balance/gait History of fall(s);Impaired balance/gait History of fall(s);Impaired balance/gait  Fall risk Follow up Falls evaluation completed Falls evaluation completed;Education provided Falls evaluation completed Falls evaluation completed;Education provided Falls evaluation completed   Functional Status Survey:    Vitals:   07/24/24 1531  BP: 122/70  Pulse: 81  Resp: 20  Temp: 98 F (36.7 C)  SpO2: 94%  Weight: 150 lb (68 kg)   Body mass index is 24.21 kg/m. Physical Exam Vitals and nursing note reviewed.  Constitutional:      Appearance: Normal appearance.  HENT:     Head: Normocephalic and atraumatic.     Nose: Nose normal.     Mouth/Throat:     Mouth: Mucous membranes are moist.     Comments: Mild erythema in throat.  Eyes:     Extraocular Movements: Extraocular movements intact.     Pupils: Pupils are equal, round, and reactive to  light.  Cardiovascular:     Rate and Rhythm:  Normal rate and regular rhythm.     Heart sounds: No murmur heard. Pulmonary:     Effort: Pulmonary effort is normal.     Breath sounds: Rales present.     Comments: Bibasilar rales Abdominal:     General: Bowel sounds are normal.     Palpations: Abdomen is soft.     Tenderness: There is no abdominal tenderness.  Musculoskeletal:     Cervical back: Normal range of motion and neck supple.     Right lower leg: No edema.     Left lower leg: No edema.  Skin:    General: Skin is warm and dry.     Comments: Left heel DTI  Neurological:     General: No focal deficit present.     Mental Status: She is alert. Mental status is at baseline.     Motor: No weakness.     Coordination: Coordination normal.     Gait: Gait abnormal.     Comments: Oriented to person  Psychiatric:     Comments: Episodes of restlessness, attempts of packing and leaving     Labs reviewed: Recent Labs    08/09/23 0000 12/17/23 0000  NA 140 140  K 4.4 4.3  CL 107 108  CO2 25* 25*  BUN 34* 37*  CREATININE 1.4* 1.3*  CALCIUM 9.1 8.9   Recent Labs    08/09/23 0000 12/17/23 0000  AST 14 17  ALT 10 14  ALKPHOS 118 117  ALBUMIN 3.4* 3.2*   Recent Labs    08/09/23 0000 12/17/23 0000 01/17/24 0000  WBC 4.6 5.0 4.2  NEUTROABS  --  2,315.00 1,768.00  HGB 10.4* 9.9* 11.5*  HCT 31* 30* 35*  PLT 231 249 293   Lab Results  Component Value Date   TSH 4.09 10/21/2021   No results found for: HGBA1C Lab Results  Component Value Date   CHOL 156 08/27/2020   HDL 69 08/27/2020   LDLCALC 75 08/27/2020   TRIG 62 08/27/2020   CHOLHDL 2.8 02/15/2010    Significant Diagnostic Results in last 30 days:  No results found.  Assessment/Plan Fever 07/23/2024 reported the patient very sleepy, not feeling good, temperature 102.2, pending chest x-ray, CBC/differential, CMP/eGFR, UA C/S results Negative COVID and flu test 07/24/2024 the patient is up eating breakfast in dining room, afebrile, in her usual  state of health Continue to observe the patient and the follow-up with test the results  Pulmonary venous congestion CXR 07/24/24 pulmonary venous congestion Adding BNP 07/24/24 wbc 11.7, Hgb 11, neutrophil 82.1%, Na 145, K 4, Bun 23, creat 1.34 Will try torsemide 20 mg daily for 3 days then resume 3 times a week Observe  CKD (chronic kidney disease) stage 3, GFR 30-59 ml/min (HCC)  Bun/creat 37/1.78 06/15/24>>23/1.34 07/24/24  Dementia without behavioral disturbance (HCC) taking Namenda , TSH 1.78 06/15/24  Essential hypertension - labile  taking Torsemide  Recurrent depression on Sertraline  since 06/02/24, Depakote  since 06/09/24, and Mirtazapine 07/03/24 Her mood is stable  Pressure injury of deep tissue of left heel Pressure reduction, float heel while in bed  UTI (urinary tract infection) UA 07/24/2024 1+ occult blood, 3+ leukocyte Estrace, WBC packed, red blood cells 20-40, bacteria many Empirical treatment in setting of the patient not feeling well, nitrofurantoin 100 mg p.o. twice daily 7 days   Family/ staff Communication: Plan of care reviewed with the patient and charge nurse  Labs/tests ordered:  pending  CXR, CBC/diff, CMP, UA C/S, adding BNP

## 2024-07-24 NOTE — Assessment & Plan Note (Signed)
 on Sertraline  since 06/02/24, Depakote  since 06/09/24, and Mirtazapine 07/03/24 Her mood is stable

## 2024-07-24 NOTE — Assessment & Plan Note (Addendum)
 CXR 07/24/24 pulmonary venous congestion Adding BNP 07/24/24 wbc 11.7, Hgb 11, neutrophil 82.1%, Na 145, K 4, Bun 23, creat 1.34 Will try torsemide 20 mg daily for 3 days then resume 3 times a week Observe

## 2024-07-25 DIAGNOSIS — N390 Urinary tract infection, site not specified: Secondary | ICD-10-CM | POA: Insufficient documentation

## 2024-07-25 DIAGNOSIS — L89626 Pressure-induced deep tissue damage of left heel: Secondary | ICD-10-CM | POA: Insufficient documentation

## 2024-07-25 MED ORDER — NITROFURANTOIN MONOHYD MACRO 100 MG PO CAPS: 100.0000 mg | ORAL_CAPSULE | Freq: Two times a day (BID) | ORAL | 0 refills | Status: AC

## 2024-07-25 NOTE — Assessment & Plan Note (Signed)
 Pressure reduction, float heel while in bed

## 2024-07-25 NOTE — Addendum Note (Signed)
 Addended by: Keval Nam X on: 07/25/2024 10:04 AM   Modules accepted: Orders

## 2024-07-25 NOTE — Assessment & Plan Note (Signed)
 UA 07/24/2024 1+ occult blood, 3+ leukocyte Estrace, WBC packed, red blood cells 20-40, bacteria many Empirical treatment in setting of the patient not feeling well, nitrofurantoin 100 mg p.o. twice daily 7 days

## 2024-07-26 DIAGNOSIS — R41841 Cognitive communication deficit: Secondary | ICD-10-CM | POA: Diagnosis not present

## 2024-07-26 DIAGNOSIS — R278 Other lack of coordination: Secondary | ICD-10-CM | POA: Diagnosis not present

## 2024-07-26 DIAGNOSIS — M6281 Muscle weakness (generalized): Secondary | ICD-10-CM | POA: Diagnosis not present

## 2024-07-26 DIAGNOSIS — Z9181 History of falling: Secondary | ICD-10-CM | POA: Diagnosis not present

## 2024-07-26 DIAGNOSIS — R2681 Unsteadiness on feet: Secondary | ICD-10-CM | POA: Diagnosis not present

## 2024-07-26 DIAGNOSIS — M25551 Pain in right hip: Secondary | ICD-10-CM | POA: Diagnosis not present

## 2024-07-28 DIAGNOSIS — M25551 Pain in right hip: Secondary | ICD-10-CM | POA: Diagnosis not present

## 2024-07-28 DIAGNOSIS — M6281 Muscle weakness (generalized): Secondary | ICD-10-CM | POA: Diagnosis not present

## 2024-07-28 DIAGNOSIS — R41841 Cognitive communication deficit: Secondary | ICD-10-CM | POA: Diagnosis not present

## 2024-07-28 DIAGNOSIS — R2681 Unsteadiness on feet: Secondary | ICD-10-CM | POA: Diagnosis not present

## 2024-07-28 DIAGNOSIS — R278 Other lack of coordination: Secondary | ICD-10-CM | POA: Diagnosis not present

## 2024-07-28 DIAGNOSIS — Z9181 History of falling: Secondary | ICD-10-CM | POA: Diagnosis not present

## 2024-07-30 DIAGNOSIS — M25551 Pain in right hip: Secondary | ICD-10-CM | POA: Diagnosis not present

## 2024-07-30 DIAGNOSIS — R2681 Unsteadiness on feet: Secondary | ICD-10-CM | POA: Diagnosis not present

## 2024-07-30 DIAGNOSIS — M6281 Muscle weakness (generalized): Secondary | ICD-10-CM | POA: Diagnosis not present

## 2024-07-30 DIAGNOSIS — R278 Other lack of coordination: Secondary | ICD-10-CM | POA: Diagnosis not present

## 2024-07-30 DIAGNOSIS — Z9181 History of falling: Secondary | ICD-10-CM | POA: Diagnosis not present

## 2024-07-30 DIAGNOSIS — R41841 Cognitive communication deficit: Secondary | ICD-10-CM | POA: Diagnosis not present

## 2024-07-31 DIAGNOSIS — R278 Other lack of coordination: Secondary | ICD-10-CM | POA: Diagnosis not present

## 2024-07-31 DIAGNOSIS — M6281 Muscle weakness (generalized): Secondary | ICD-10-CM | POA: Diagnosis not present

## 2024-07-31 DIAGNOSIS — R41841 Cognitive communication deficit: Secondary | ICD-10-CM | POA: Diagnosis not present

## 2024-07-31 DIAGNOSIS — M25551 Pain in right hip: Secondary | ICD-10-CM | POA: Diagnosis not present

## 2024-07-31 DIAGNOSIS — R2681 Unsteadiness on feet: Secondary | ICD-10-CM | POA: Diagnosis not present

## 2024-07-31 DIAGNOSIS — Z9181 History of falling: Secondary | ICD-10-CM | POA: Diagnosis not present

## 2024-08-01 DIAGNOSIS — M25551 Pain in right hip: Secondary | ICD-10-CM | POA: Diagnosis not present

## 2024-08-01 DIAGNOSIS — R2681 Unsteadiness on feet: Secondary | ICD-10-CM | POA: Diagnosis not present

## 2024-08-01 DIAGNOSIS — R278 Other lack of coordination: Secondary | ICD-10-CM | POA: Diagnosis not present

## 2024-08-01 DIAGNOSIS — M6281 Muscle weakness (generalized): Secondary | ICD-10-CM | POA: Diagnosis not present

## 2024-08-01 DIAGNOSIS — R41841 Cognitive communication deficit: Secondary | ICD-10-CM | POA: Diagnosis not present

## 2024-08-01 DIAGNOSIS — Z9181 History of falling: Secondary | ICD-10-CM | POA: Diagnosis not present

## 2024-08-02 ENCOUNTER — Non-Acute Institutional Stay (SKILLED_NURSING_FACILITY): Payer: Self-pay | Admitting: Adult Health

## 2024-08-02 ENCOUNTER — Encounter: Payer: Self-pay | Admitting: Adult Health

## 2024-08-02 ENCOUNTER — Other Ambulatory Visit: Payer: Self-pay | Admitting: Adult Health

## 2024-08-02 DIAGNOSIS — F329 Major depressive disorder, single episode, unspecified: Secondary | ICD-10-CM | POA: Diagnosis not present

## 2024-08-02 DIAGNOSIS — R627 Adult failure to thrive: Secondary | ICD-10-CM | POA: Diagnosis not present

## 2024-08-02 DIAGNOSIS — I872 Venous insufficiency (chronic) (peripheral): Secondary | ICD-10-CM

## 2024-08-02 DIAGNOSIS — F03918 Unspecified dementia, unspecified severity, with other behavioral disturbance: Secondary | ICD-10-CM

## 2024-08-02 DIAGNOSIS — M150 Primary generalized (osteo)arthritis: Secondary | ICD-10-CM

## 2024-08-02 DIAGNOSIS — M25551 Pain in right hip: Secondary | ICD-10-CM | POA: Diagnosis not present

## 2024-08-02 DIAGNOSIS — R41841 Cognitive communication deficit: Secondary | ICD-10-CM | POA: Diagnosis not present

## 2024-08-02 DIAGNOSIS — M6281 Muscle weakness (generalized): Secondary | ICD-10-CM | POA: Diagnosis not present

## 2024-08-02 DIAGNOSIS — R2681 Unsteadiness on feet: Secondary | ICD-10-CM | POA: Diagnosis not present

## 2024-08-02 DIAGNOSIS — R278 Other lack of coordination: Secondary | ICD-10-CM | POA: Diagnosis not present

## 2024-08-02 DIAGNOSIS — Z9181 History of falling: Secondary | ICD-10-CM | POA: Diagnosis not present

## 2024-08-02 MED ORDER — MORPHINE SULFATE (CONCENTRATE) 20 MG/ML PO SOLN: 5.0000 mg | Freq: Four times a day (QID) | ORAL | 0 refills | Status: AC

## 2024-08-02 MED ORDER — TRAMADOL HCL 50 MG PO TABS: 50.0000 mg | ORAL_TABLET | Freq: Four times a day (QID) | ORAL | 0 refills | Status: DC

## 2024-08-02 NOTE — Progress Notes (Signed)
 Location:  [facility] Room Number: 35 A Place of Service:  SNF (31) Provider:  Medina-Vargas, Kweli Grassel, DNP, FNP-BC  Patient Care Team: Charlanne Fredia CROME, MD as PCP - General (Internal Medicine) Pa, [facility] (Ophthalmology) Dermatology, [facility] (Dermatology)  Extended Emergency Contact Information Primary Emergency Contact: Jones,Beth Address: [address]          [place], [place] [address] [place] Home Phone: [phone] Mobile Phone: [phone] Relation: Daughter  Code Status:   DNR  Goals of care: Advanced Directive information    06/02/2024   11:11 AM  Advanced Directives  Does Patient Have a Medical Advance Directive? Yes  Type of Estate Agent of [place];Living will;Out of facility DNR (pink MOST or yellow form)  Does patient want to make changes to medical advance directive? No - Patient declined  Copy of Healthcare Power of Attorney in Chart? Yes - validated most recent copy scanned in chart (See row information)     Chief Complaint  Patient presents with   Acute Visit     Health decline    HPI:  Pt is a 89 y.o. female seen today for an acute visit regarding health decline. She was noted to have functional decline, needing more assistance so she was transferred to SNF from ALF, has lost 15 lbs since the move. She has poor appetite and currently on Remeron 7.5 mg at bedtime. She complains of arthritic pains and currently on Tramadol  50 mg QID. She was noted to be crying a lot and was started on Sertraline .   Past Medical History:  Diagnosis Date   Anxiety    Cataract    Chest pain 02/16/2010   Myoview, negative for pharmacologic-stress induced ischemia, EF 74   Complication of anesthesia 1954   hard to wake up   Diverticulosis    DUB (dysfunctional uterine bleeding)    Dysrhythmia    h/o PVCs- none recently- no meds   Fall    GERD (gastroesophageal reflux disease)    Hearing reduced     Hypertension    no meds currently   Loss of consciousness (HCC)    Memory loss    Osteoarthritis of left hip    Osteoporosis    Palpitation 02/02/2013   A Cardio[facility] MCOT (mobile cardiac outpatient telemetry ): NSR with PACs; sinus tachycardia with ventricular trigeminy. Brief atrial run. Facet cardiac; ventricular trigeminy and accelerated idioventricular rhythm. No significant tachyarrhythmias.   Palpitations    Postmenopausal bleeding 11/01/2014   Unsteady gait    Varicose veins of both legs with pain    Past Surgical History:  Procedure Laterality Date   APPENDECTOMY     CATARACT EXTRACTION     both eyes   CESAREAN SECTION  1962, 1963, 8035,8030   x 4   COLONOSCOPY  01/14/2017   DIAGNOSTIC LAPAROSCOPY     ECTOPIC PREGNANCY SURGERY  1966   FRACTURE SURGERY     HIP SURGERY Right    HYSTEROSCOPY WITH D & C N/A 11/02/2014   Procedure: DILATATION AND CURETTAGE /HYSTEROSCOPY;  Surgeon: Ezzie Buba, MD;  Location: [facility];  Service: Gynecology;  Laterality: N/A;   Lower Extremity Venous Dopplers  01/2013   No thrombus or thrombophlebitis. R Greater Saphenous Vein - enlarged without other insufficiency (6 mm); right and left Small Saphenous Vein: Patent no valve insufficiency.   TONSILLECTOMY     TONSILLECTOMY AND ADENOIDECTOMY     TRANSTHORACIC ECHOCARDIOGRAM  01/2013   Normal  LV size and function. EF 55-60%. Grade 1 diastolic dysfunction. Elevated LV filling pressures. No regional W. May.    Allergies  Allergen Reactions   Evista [Raloxifene]    Fosamax [Alendronate]    Penicillins    Polyoxyethylene Lauryl Ether [Sorbitan]    Moxifloxacin Rash    Outpatient Encounter Medications as of 08/02/2024  Medication Sig   acetaminophen  (TYLENOL ) 500 MG tablet Take 2 tablets (1,000 mg total) by mouth 3 (three) times daily as needed.   Calcium Carbonate (CALCIUM 600 PO) Take 1 tablet by mouth daily.   divalproex  (DEPAKOTE  SPRINKLE) 125 MG capsule Take 1 capsule (125 mg  total) by mouth 2 (two) times daily.   Emollient (CERAVE) LOTN Apply 1 application. topically daily.   fluticasone (FLONASE) 50 MCG/ACT nasal spray Place 1 spray into both nostrils daily.   memantine  (NAMENDA ) 10 MG tablet Take 10 mg by mouth 2 (two) times daily.   mirtazapine (REMERON) 7.5 MG tablet Take 7.5 mg by mouth at bedtime.   MULTIPLE VITAMIN PO Take by mouth daily.   nitrofurantoin , macrocrystal-monohydrate, (MACROBID ) 100 MG capsule Take 1 capsule (100 mg total) by mouth 2 (two) times daily.   polyethylene glycol powder (GLYCOLAX /MIRALAX ) 17 GM/SCOOP powder Take 17 g by mouth every other day. Dissolve 1 capful (17g) in 4-8 ounces of liquid and take by mouth daily.   sennosides-docusate sodium (SENOKOT-S) 8.6-50 MG tablet Take 2 tablets by mouth daily.   sertraline  (ZOLOFT ) 50 MG tablet Take 1.5 tablets (75 mg total) by mouth daily.   torsemide  (DEMADEX ) 20 MG tablet Take 1 tablet (20 mg total) by mouth 3 (three) times a week.   torsemide  (DEMADEX ) 20 MG tablet Take 1 tablet (20 mg total) by mouth daily for 3 days.   traMADol  (ULTRAM ) 50 MG tablet Take 1 tablet (50 mg total) by mouth 4 (four) times daily.   Zinc Oxide 10 % OINT Apply topically daily as needed.   No facility-administered encounter medications on file as of 08/02/2024.    Review of Systems  Constitutional:  Positive for appetite change. Negative for chills, fatigue and fever.  HENT:  Negative for congestion, hearing loss, rhinorrhea and sore throat.   Eyes: Negative.   Respiratory:  Negative for cough, shortness of breath and wheezing.   Cardiovascular:  Negative for chest pain, palpitations and leg swelling.  Gastrointestinal:  Negative for abdominal pain, constipation, diarrhea, nausea and vomiting.  Genitourinary:  Negative for dysuria.  Musculoskeletal:  Positive for arthralgias. Negative for back pain and myalgias.  Skin:  Negative for color change and rash.  Neurological:  Negative for dizziness, weakness  and headaches.  Psychiatric/Behavioral:  Negative for behavioral problems. The patient is not nervous/anxious.     Immunization History  Administered Date(s) Administered   Fluad Quad(high Dose 65+) 07/08/2022   INFLUENZA, HIGH DOSE SEASONAL PF 07/13/2023   Influenza, Quadrivalent, Recombinant, Inj, Pf 06/18/2018, 05/04/2019, 06/15/2020   Influenza-Unspecified 09/15/2019, 07/08/2021, 07/08/2023   Moderna Covid-19 Fall Seasonal Vaccine 48yrs & older 01/05/2024   Moderna Covid-19 Vaccine  Bivalent Booster 97yrs & up 07/21/2023   Moderna Sars-Covid-2 Vaccination 09/15/2019   PFIZER(Purple Top)SARS-COV-2 Vaccination 10/03/2019, 10/22/2019, 09/03/2020   PPD Test 10/09/2020   Pfizer Covid-19 Vaccine Bivalent Booster 62yrs & up 06/04/2021   Pneumococcal Conjugate-13 05/28/2014   Pneumococcal Polysaccharide-23 07/15/1998, 06/15/2005   RSV,unspecified 08/29/2022   Tdap 05/26/2016   Unspecified SARS-COV-2 Vaccination 03/03/2022   Zoster, Live 05/18/2013, 05/28/2014   Pertinent  Health Maintenance Due  Topic Date Due  Influenza Vaccine  04/14/2024   Bone Density Scan  Discontinued      05/24/2024    3:41 PM 06/02/2024   12:48 PM 06/09/2024    3:49 PM 06/14/2024    1:46 PM 06/26/2024    1:26 PM  Fall Risk  Falls in the past year? 1 1 1 1 1   Was there an injury with Fall? 0 0 0 0 0  Fall Risk Category Calculator 2 2 2 2 2   Patient at Risk for Falls Due to History of fall(s);Impaired balance/gait History of fall(s);Impaired balance/gait History of fall(s);Impaired balance/gait History of fall(s);Impaired balance/gait History of fall(s);Impaired balance/gait  Fall risk Follow up Falls evaluation completed Falls evaluation completed;Education provided Falls evaluation completed Falls evaluation completed;Education provided Falls evaluation completed     Vitals:   08/02/24 1659  BP: 126/67  Pulse: 84  Resp: 17  Temp: 98.5 F (36.9 C)  SpO2: 95%  Weight: 150 lb (68 kg)  Height: 5' 6  (1.676 m)   Body mass index is 24.21 kg/m.  Physical Exam Constitutional:      General: She is not in acute distress.    Appearance: Normal appearance.  HENT:     Head: Normocephalic and atraumatic.     Nose: Nose normal.     Mouth/Throat:     Mouth: Mucous membranes are moist.  Eyes:     Conjunctiva/sclera: Conjunctivae normal.  Cardiovascular:     Rate and Rhythm: Normal rate and regular rhythm.  Pulmonary:     Effort: Pulmonary effort is normal.     Breath sounds: Normal breath sounds.  Abdominal:     General: Bowel sounds are normal.     Palpations: Abdomen is soft.  Musculoskeletal:     Cervical back: Normal range of motion.  Skin:    General: Skin is warm and dry.  Psychiatric:        Mood and Affect: Mood normal.        Behavior: Behavior normal.      Labs reviewed: Recent Labs    08/09/23 0000 12/17/23 0000  NA 140 140  K 4.4 4.3  CL 107 108  CO2 25* 25*  BUN 34* 37*  CREATININE 1.4* 1.3*  CALCIUM 9.1 8.9   Recent Labs    08/09/23 0000 12/17/23 0000  AST 14 17  ALT 10 14  ALKPHOS 118 117  ALBUMIN 3.4* 3.2*   Recent Labs    08/09/23 0000 12/17/23 0000 01/17/24 0000  WBC 4.6 5.0 4.2  NEUTROABS  --  2,315.00 1,768.00  HGB 10.4* 9.9* 11.5*  HCT 31* 30* 35*  PLT 231 249 293   Lab Results  Component Value Date   TSH 4.09 10/21/2021   No results found for: HGBA1C Lab Results  Component Value Date   CHOL 156 08/27/2020   HDL 69 08/27/2020   LDLCALC 75 08/27/2020   TRIG 62 08/27/2020   CHOLHDL 2.8 02/15/2010    Significant Diagnostic Results in last 30 days:  No results found.  Assessment/Plan  1. Failure to thrive in adult (Primary) -  had 15 lb weight loss, >5% in a month -  continue Remeron 7.5 mg at bedtime -  will refer for hospice care  2. Primary osteoarthritis involving multiple joints -  Tramadol  ineffective for pain -  will start on Roxanol 5 mg = 0.25 ml SL QID and Roxanol 5 mg = 0.25 ml BID PRN  3. Major  depression, chronic -  continue Sertraline  75 mg  daily  4. Dementia with behavioral disturbance (HCC) -  cognition noted to have declined, increase in confusion -   discontinue Namenda  -  continue Depakote  sprinkle 125 mg BID -  comfort care/hospice    Family/ staff Communication: Discussed plan of care with resident, daughter, son-in-law and charge nurse.  Labs/tests ordered: None    Gareth Fitzner Medina-Vargas, DNP, MSN, FNP-BC [facility] [phone] (Monday-Friday 8:00 a.m. - 5:00 p.m.) [phone] (after hours)

## 2024-08-03 DIAGNOSIS — N189 Chronic kidney disease, unspecified: Secondary | ICD-10-CM | POA: Diagnosis not present

## 2024-08-04 DIAGNOSIS — R2681 Unsteadiness on feet: Secondary | ICD-10-CM | POA: Diagnosis not present

## 2024-08-04 DIAGNOSIS — R278 Other lack of coordination: Secondary | ICD-10-CM | POA: Diagnosis not present

## 2024-08-04 DIAGNOSIS — M25551 Pain in right hip: Secondary | ICD-10-CM | POA: Diagnosis not present

## 2024-08-04 DIAGNOSIS — Z9181 History of falling: Secondary | ICD-10-CM | POA: Diagnosis not present

## 2024-08-04 DIAGNOSIS — R41841 Cognitive communication deficit: Secondary | ICD-10-CM | POA: Diagnosis not present

## 2024-08-04 DIAGNOSIS — M6281 Muscle weakness (generalized): Secondary | ICD-10-CM | POA: Diagnosis not present

## 2024-08-07 DIAGNOSIS — F419 Anxiety disorder, unspecified: Secondary | ICD-10-CM | POA: Diagnosis not present

## 2024-08-07 DIAGNOSIS — F01518 Vascular dementia, unspecified severity, with other behavioral disturbance: Secondary | ICD-10-CM | POA: Diagnosis not present

## 2024-08-07 DIAGNOSIS — K219 Gastro-esophageal reflux disease without esophagitis: Secondary | ICD-10-CM | POA: Diagnosis not present

## 2024-08-07 DIAGNOSIS — I679 Cerebrovascular disease, unspecified: Secondary | ICD-10-CM | POA: Diagnosis not present

## 2024-08-07 DIAGNOSIS — I739 Peripheral vascular disease, unspecified: Secondary | ICD-10-CM | POA: Diagnosis not present

## 2024-08-07 DIAGNOSIS — I10 Essential (primary) hypertension: Secondary | ICD-10-CM | POA: Diagnosis not present

## 2024-08-07 DIAGNOSIS — D649 Anemia, unspecified: Secondary | ICD-10-CM | POA: Diagnosis not present

## 2024-08-07 DIAGNOSIS — M1990 Unspecified osteoarthritis, unspecified site: Secondary | ICD-10-CM | POA: Diagnosis not present

## 2024-08-07 DIAGNOSIS — G2581 Restless legs syndrome: Secondary | ICD-10-CM | POA: Diagnosis not present

## 2024-08-07 DIAGNOSIS — F32A Depression, unspecified: Secondary | ICD-10-CM | POA: Diagnosis not present

## 2024-08-07 DIAGNOSIS — N1832 Chronic kidney disease, stage 3b: Secondary | ICD-10-CM | POA: Diagnosis not present

## 2024-08-07 DIAGNOSIS — M810 Age-related osteoporosis without current pathological fracture: Secondary | ICD-10-CM | POA: Diagnosis not present

## 2024-08-08 DIAGNOSIS — F32A Depression, unspecified: Secondary | ICD-10-CM | POA: Diagnosis not present

## 2024-08-08 DIAGNOSIS — K219 Gastro-esophageal reflux disease without esophagitis: Secondary | ICD-10-CM | POA: Diagnosis not present

## 2024-08-08 DIAGNOSIS — F419 Anxiety disorder, unspecified: Secondary | ICD-10-CM | POA: Diagnosis not present

## 2024-08-08 DIAGNOSIS — F01518 Vascular dementia, unspecified severity, with other behavioral disturbance: Secondary | ICD-10-CM | POA: Diagnosis not present

## 2024-08-08 DIAGNOSIS — I679 Cerebrovascular disease, unspecified: Secondary | ICD-10-CM | POA: Diagnosis not present

## 2024-08-08 DIAGNOSIS — I10 Essential (primary) hypertension: Secondary | ICD-10-CM | POA: Diagnosis not present

## 2024-08-14 DEATH — deceased
